# Patient Record
Sex: Female | Born: 1950 | Race: White | Hispanic: No | Marital: Married | Smoking: Former smoker
Health system: Southern US, Community
[De-identification: ages and names within clinical notes are randomized; demographics above are authoritative.]

## PROBLEM LIST (undated history)

## (undated) DIAGNOSIS — D649 Anemia, unspecified: Secondary | ICD-10-CM

## (undated) DIAGNOSIS — E785 Hyperlipidemia, unspecified: Secondary | ICD-10-CM

## (undated) DIAGNOSIS — C801 Malignant (primary) neoplasm, unspecified: Secondary | ICD-10-CM

## (undated) DIAGNOSIS — J069 Acute upper respiratory infection, unspecified: Secondary | ICD-10-CM

## (undated) DIAGNOSIS — H269 Unspecified cataract: Secondary | ICD-10-CM

## (undated) DIAGNOSIS — K219 Gastro-esophageal reflux disease without esophagitis: Secondary | ICD-10-CM

## (undated) DIAGNOSIS — Z9221 Personal history of antineoplastic chemotherapy: Secondary | ICD-10-CM

## (undated) DIAGNOSIS — I700 Atherosclerosis of aorta: Secondary | ICD-10-CM

## (undated) DIAGNOSIS — M1990 Unspecified osteoarthritis, unspecified site: Secondary | ICD-10-CM

## (undated) DIAGNOSIS — E079 Disorder of thyroid, unspecified: Secondary | ICD-10-CM

## (undated) DIAGNOSIS — I10 Essential (primary) hypertension: Secondary | ICD-10-CM

## (undated) DIAGNOSIS — M8580 Other specified disorders of bone density and structure, unspecified site: Secondary | ICD-10-CM

## (undated) DIAGNOSIS — L409 Psoriasis, unspecified: Secondary | ICD-10-CM

## (undated) DIAGNOSIS — T7840XA Allergy, unspecified, initial encounter: Secondary | ICD-10-CM

## (undated) DIAGNOSIS — R042 Hemoptysis: Secondary | ICD-10-CM

## (undated) DIAGNOSIS — K449 Diaphragmatic hernia without obstruction or gangrene: Secondary | ICD-10-CM

## (undated) HISTORY — DX: Hyperlipidemia, unspecified: E78.5

## (undated) HISTORY — PX: UPPER GASTROINTESTINAL ENDOSCOPY: SHX188

## (undated) HISTORY — DX: Unspecified cataract: H26.9

## (undated) HISTORY — DX: Allergy, unspecified, initial encounter: T78.40XA

## (undated) HISTORY — DX: Atherosclerosis of aorta: I70.0

## (undated) HISTORY — DX: Unspecified osteoarthritis, unspecified site: M19.90

## (undated) HISTORY — DX: Gastro-esophageal reflux disease without esophagitis: K21.9

## (undated) HISTORY — DX: Anemia, unspecified: D64.9

## (undated) HISTORY — PX: APPENDECTOMY: SHX54

## (undated) HISTORY — PX: OTHER SURGICAL HISTORY: SHX169

## (undated) HISTORY — PX: POLYPECTOMY: SHX149

## (undated) HISTORY — PX: LUNG REMOVAL, PARTIAL: SHX233

## (undated) HISTORY — DX: Other specified disorders of bone density and structure, unspecified site: M85.80

## (undated) HISTORY — DX: Diaphragmatic hernia without obstruction or gangrene: K44.9

## (undated) HISTORY — PX: FOOT NEUROMA SURGERY: SHX646

## (undated) HISTORY — DX: Psoriasis, unspecified: L40.9

## (undated) HISTORY — DX: Disorder of thyroid, unspecified: E07.9

---

## 1980-09-25 HISTORY — PX: TUBAL LIGATION: SHX77

## 2001-03-22 ENCOUNTER — Other Ambulatory Visit: Admission: RE | Admit: 2001-03-22 | Discharge: 2001-03-22 | Payer: Self-pay | Admitting: Internal Medicine

## 2001-03-22 ENCOUNTER — Encounter: Payer: Self-pay | Admitting: Internal Medicine

## 2001-03-22 LAB — CONVERTED CEMR LAB: Pap Smear: NORMAL

## 2001-04-25 HISTORY — PX: ESOPHAGOGASTRODUODENOSCOPY: SHX1529

## 2001-04-25 HISTORY — PX: COLONOSCOPY: SHX174

## 2003-07-13 ENCOUNTER — Encounter: Payer: Self-pay | Admitting: Internal Medicine

## 2004-08-09 ENCOUNTER — Emergency Department: Payer: Self-pay | Admitting: Emergency Medicine

## 2004-08-12 ENCOUNTER — Ambulatory Visit: Payer: Self-pay | Admitting: Internal Medicine

## 2004-08-30 ENCOUNTER — Ambulatory Visit: Payer: Self-pay | Admitting: Internal Medicine

## 2004-09-13 ENCOUNTER — Ambulatory Visit: Payer: Self-pay | Admitting: Internal Medicine

## 2005-03-01 ENCOUNTER — Ambulatory Visit: Payer: Self-pay | Admitting: Internal Medicine

## 2005-07-31 ENCOUNTER — Ambulatory Visit: Payer: Self-pay | Admitting: Family Medicine

## 2005-10-05 ENCOUNTER — Ambulatory Visit: Payer: Self-pay | Admitting: Internal Medicine

## 2005-10-05 ENCOUNTER — Other Ambulatory Visit: Admission: RE | Admit: 2005-10-05 | Discharge: 2005-10-05 | Payer: Self-pay | Admitting: Internal Medicine

## 2005-10-05 LAB — CONVERTED CEMR LAB: Pap Smear: NORMAL

## 2005-10-09 ENCOUNTER — Ambulatory Visit: Payer: Self-pay | Admitting: Internal Medicine

## 2007-01-22 ENCOUNTER — Encounter: Payer: Self-pay | Admitting: Internal Medicine

## 2007-01-22 DIAGNOSIS — L409 Psoriasis, unspecified: Secondary | ICD-10-CM | POA: Insufficient documentation

## 2007-02-04 ENCOUNTER — Ambulatory Visit: Payer: Self-pay | Admitting: Internal Medicine

## 2007-02-04 DIAGNOSIS — E785 Hyperlipidemia, unspecified: Secondary | ICD-10-CM | POA: Insufficient documentation

## 2007-02-05 ENCOUNTER — Encounter: Payer: Self-pay | Admitting: Internal Medicine

## 2007-02-11 ENCOUNTER — Encounter: Payer: Self-pay | Admitting: Internal Medicine

## 2007-03-28 ENCOUNTER — Ambulatory Visit: Payer: Self-pay | Admitting: Internal Medicine

## 2007-03-28 ENCOUNTER — Encounter: Payer: Self-pay | Admitting: Internal Medicine

## 2007-04-02 ENCOUNTER — Encounter (INDEPENDENT_AMBULATORY_CARE_PROVIDER_SITE_OTHER): Payer: Self-pay | Admitting: *Deleted

## 2007-05-10 ENCOUNTER — Telehealth (INDEPENDENT_AMBULATORY_CARE_PROVIDER_SITE_OTHER): Payer: Self-pay | Admitting: *Deleted

## 2007-08-07 ENCOUNTER — Encounter: Payer: Self-pay | Admitting: Internal Medicine

## 2007-12-04 ENCOUNTER — Encounter: Payer: Self-pay | Admitting: Internal Medicine

## 2007-12-13 ENCOUNTER — Ambulatory Visit: Payer: Self-pay | Admitting: Internal Medicine

## 2008-01-20 ENCOUNTER — Telehealth (INDEPENDENT_AMBULATORY_CARE_PROVIDER_SITE_OTHER): Payer: Self-pay | Admitting: *Deleted

## 2008-04-23 ENCOUNTER — Ambulatory Visit: Payer: Self-pay | Admitting: Internal Medicine

## 2008-04-24 ENCOUNTER — Encounter: Payer: Self-pay | Admitting: Internal Medicine

## 2008-04-28 ENCOUNTER — Encounter: Payer: Self-pay | Admitting: Internal Medicine

## 2008-04-28 ENCOUNTER — Ambulatory Visit: Payer: Self-pay | Admitting: Internal Medicine

## 2008-10-23 ENCOUNTER — Ambulatory Visit: Payer: Self-pay | Admitting: Internal Medicine

## 2008-11-19 ENCOUNTER — Telehealth: Payer: Self-pay | Admitting: Internal Medicine

## 2009-01-18 ENCOUNTER — Ambulatory Visit: Payer: Self-pay | Admitting: Internal Medicine

## 2009-04-26 ENCOUNTER — Telehealth: Payer: Self-pay | Admitting: Internal Medicine

## 2009-04-29 ENCOUNTER — Encounter (INDEPENDENT_AMBULATORY_CARE_PROVIDER_SITE_OTHER): Payer: Self-pay | Admitting: *Deleted

## 2009-04-29 ENCOUNTER — Ambulatory Visit: Payer: Self-pay | Admitting: Internal Medicine

## 2009-05-12 ENCOUNTER — Encounter: Payer: Self-pay | Admitting: Internal Medicine

## 2009-05-12 ENCOUNTER — Ambulatory Visit: Payer: Self-pay | Admitting: Internal Medicine

## 2009-05-17 ENCOUNTER — Encounter: Payer: Self-pay | Admitting: Internal Medicine

## 2010-10-27 ENCOUNTER — Encounter (INDEPENDENT_AMBULATORY_CARE_PROVIDER_SITE_OTHER): Payer: 59 | Admitting: Internal Medicine

## 2010-10-27 ENCOUNTER — Encounter: Payer: Self-pay | Admitting: Internal Medicine

## 2010-10-27 ENCOUNTER — Ambulatory Visit: Admit: 2010-10-27 | Payer: Self-pay | Admitting: Internal Medicine

## 2010-10-27 DIAGNOSIS — L408 Other psoriasis: Secondary | ICD-10-CM

## 2010-10-27 DIAGNOSIS — K219 Gastro-esophageal reflux disease without esophagitis: Secondary | ICD-10-CM | POA: Insufficient documentation

## 2010-10-27 DIAGNOSIS — Z0000 Encounter for general adult medical examination without abnormal findings: Secondary | ICD-10-CM

## 2010-10-27 DIAGNOSIS — M949 Disorder of cartilage, unspecified: Secondary | ICD-10-CM

## 2010-10-27 DIAGNOSIS — M899 Disorder of bone, unspecified: Secondary | ICD-10-CM | POA: Insufficient documentation

## 2010-11-01 ENCOUNTER — Other Ambulatory Visit: Payer: Self-pay | Admitting: Internal Medicine

## 2010-11-01 ENCOUNTER — Encounter (INDEPENDENT_AMBULATORY_CARE_PROVIDER_SITE_OTHER): Payer: Self-pay | Admitting: *Deleted

## 2010-11-01 ENCOUNTER — Other Ambulatory Visit: Payer: 59

## 2010-11-01 DIAGNOSIS — Z1211 Encounter for screening for malignant neoplasm of colon: Secondary | ICD-10-CM

## 2010-11-02 DIAGNOSIS — K921 Melena: Secondary | ICD-10-CM | POA: Insufficient documentation

## 2010-11-02 NOTE — Assessment & Plan Note (Signed)
Summary: CPE/CYD   Vital Signs:  Patient profile:   60 year old female Weight:      153 pounds BMI:     25.16 Temp:     98.9 degrees F oral Pulse rate:   83 / minute Pulse rhythm:   regular BP sitting:   122 / 68  (right arm) Cuff size:   regular  Vitals Entered By: Mervin Hack CMA Duncan Dull) (October 27, 2010 2:59 PM) CC: adult physical   History of Present Illness: Doing well Still on MTX---reasonable control of psoriasis Has had some recent flares due to stress at work  Still sees Dr Barnabas Lister Due for mammo --I order them Had cervical biopsy in past year---was negative. Pap again since then  Discussed colonoscopy she doesn't want this again  Allergies: 1)  Pravachol (Pravastatin Sodium)  Past History:  Past medical, surgical, family and social histories (including risk factors) reviewed for relevance to current acute and chronic problems.  Past Medical History: Psoriasis Hyperlipidemia Osteopenia GERD  Past Surgical History: Reviewed history from 12/13/2007 and no changes required. EGD/colon - dilation 08/02 1982---tubal ligation 1992/1994---Benign tumors of L foot VD x 2 Abnormal Pap--low grade intraepithelial lesion. Colposcipy okay  Family History: Reviewed history from 01/22/2007 and no changes required. Mom with CAD--Mi in her 36's Dad died of lung cancer No DM or HTN  Social History: Reviewed history from 10/23/2008 and no changes required. Divorced ---remarried---  1 child living.  1 son died in 93 Occupation: Office work @ [employer] Former smoker--quit 10/09 Alcohol use-yes--occ  Review of Systems General:  weight is up a few pounds has just restarted walking--has puppy sleep is variable---occ middle of the night awakening. Occ daytime tiredness wears seat belt. Eyes:  Complains of blurring; denies double vision and vision loss-1 eye. ENT:  Denies decreased hearing and ringing in ears; full dentures--fit okay. CV:  Complains of  chest pain or discomfort and shortness of breath with exertion; denies difficulty breathing at night, difficulty breathing while lying down, fainting, lightheadness, and palpitations; occ tight feeling in chest--relates to stress Has noted some increased DOE---relates to increased weight and decreased fitness. Resp:  Denies cough and shortness of breath. GI:  Complains of indigestion; denies abdominal pain, bloody stools, change in bowel habits, dark tarry stools, nausea, and vomiting; some trouble with hiatal hernia--as needed prevacid helps. GU:  Complains of incontinence; denies dysuria; rare urge incontinence---generally only first thing in the morning No sexual problems. MS:  Denies joint pain and joint swelling. Derm:  Complains of lesion(s); denies rash; slight brown areas on right temple. Neuro:  Denies headaches, numbness, tingling, and weakness. Psych:  Denies anxiety and depression. Heme:  Denies abnormal bruising and enlarge lymph nodes. Allergy:  Complains of seasonal allergies and sneezing; intermittent sinus symptoms---1/2 sudafed helps.  Physical Exam  General:  alert and normal appearance.   Eyes:  pupils equal, pupils round, pupils reactive to light, and no optic disk abnormalities.   Ears:  R ear normal and L ear normal.   Mouth:  no erythema, no exudates, and no lesions.   Neck:  supple, no masses, no thyromegaly, no carotid bruits, and no cervical lymphadenopathy.   Breasts:  no abnormal thickening, no tenderness, and no adenopathy.  Cystic changes on right Lungs:  normal respiratory effort, no intercostal retractions, no accessory muscle use, and normal breath sounds.   Heart:  normal rate, regular rhythm, no murmur, and no gallop.   Abdomen:  soft, non-tender, and no  masses.   Msk:  no joint tenderness and no joint swelling.   Pulses:  1+ in feet Extremities:  no edema Neurologic:  alert & oriented X3, strength normal in all extremities, and gait normal.   Skin:  no  ulcerations.   Very mild psoriatic rash mostly on elbows Right temple has benign nevi Psych:  normally interactive, good eye contact, not anxious appearing, and not depressed appearing.     Impression & Recommendations:  Problem # 1:  HEALTH MAINTENANCE EXAM (ICD-V70.0) Assessment Comment Only healthy discussed fitness stool immunoassay----she prefers no colon mammo  Problem # 2:  PSORIASIS (ICD-696.1) Assessment: Unchanged  doing fine needs labs for MTX  Orders: Specimen Handling (13086) Venipuncture (57846)  Problem # 3:  GERD (ICD-530.81) Assessment: Unchanged okay with as needed prevacid  Problem # 4:  OSTEOPENIA (ICD-733.90) Assessment: Unchanged on vitamin D  Her updated medication list for this problem includes:    Vitamin D (ergocalciferol) 50000 Unit Caps (Ergocalciferol) .Marland Kitchen... Take one by mouth monthly  Problem # 5:  HYPERLIPIDEMIA (ICD-272.4) Assessment: Unchanged will recheck levels  Complete Medication List: 1)  Methotrexate 2.5 Mg Tabs (Methotrexate sodium) .... Take 6 by mouth weekly 2)  Vitamin D (ergocalciferol) 50000 Unit Caps (Ergocalciferol) .... Take one by mouth monthly 3)  Folic Acid 1 Mg Tabs (Folic acid) .... Take one by mouth daily  Other Orders: Radiology Referral (Radiology)  Patient Instructions: 1)  Please schedule a follow-up appointment in 1 year.  2)  Schedule your mammogram.  3)  Complete your hemoccult cards and return them soon.  4)  Labs to [facility] today---  CBC with diff, met C, TSH-- 696.1 5)     lipid--272,4   Orders Added: 1)  Specimen Handling [99000] 2)  Venipuncture [96295] 3)  Est. Patient 40-64 years [99396] 4)  Radiology Referral [Radiology]    Current Allergies (reviewed today): PRAVACHOL (PRAVASTATIN SODIUM)  Appended Document: CPE/CYD

## 2010-11-02 NOTE — Letter (Signed)
Summary: [facility]: Immunoassay Fecal Occult Blood (iFOB) Order Form  [place] at [facility]  [address] [place], [place] [address]   Phone: [phone]  Fax: [phone]      [facility]: Immunoassay Fecal Occult Blood (iFOB) Order Form   October 27, 2010 MRN: [id number]   Regina Grimes 1951/07/10   Physicican Name:__________[place]_______________  Diagnosis Code:___________V76.51_______________      Cindee Salt MD

## 2010-11-03 ENCOUNTER — Encounter (INDEPENDENT_AMBULATORY_CARE_PROVIDER_SITE_OTHER): Payer: Self-pay | Admitting: *Deleted

## 2010-11-10 NOTE — Letter (Signed)
Summary: Pre Visit Letter Revised  [facility]  [address] [place], [place] [address]   Phone: [phone]  Fax: [phone]        11/03/2010 MRN: [id number] Regina Grimes [address] [place], [place]  [address]  [place]             Procedure Date:  12-16-10   Welcome to the Gastroenterology Division at [facility].    You are scheduled to see a nurse for your pre-procedure visit on 12-02-10 at 8:30A.M. on the 3rd floor at [facility], [address]  We ask that you try to arrive at our office 15 minutes prior to your appointment time to allow for check-in.  Please take a minute to review the attached form.  If you answer "Yes" to one or more of the questions on the first page, we ask that you call the person listed at your earliest opportunity.  If you answer "No" to all of the questions, please complete the rest of the form and bring it to your appointment.    Your nurse visit will consist of discussing your medical and surgical history, your immediate family medical history, and your medications.   If you are unable to list all of your medications on the form, please bring the medication bottles to your appointment and we will list them.  We will need to be aware of both prescribed and over the counter drugs.  We will need to know exact dosage information as well.    Please be prepared to read and sign documents such as consent forms, a financial agreement, and acknowledgement forms.  If necessary, and with your consent, a friend or relative is welcome to sit-in on the nurse visit with you.  Please bring your insurance card so that we may make a copy of it.  If your insurance requires a referral to see a specialist, please bring your referral form from your primary care physician.  No co-pay is required for this nurse visit.     If you cannot keep your appointment, please call [phone] to cancel or reschedule prior to your appointment date.  This allows [place]  the opportunity to schedule an appointment for another patient in need of care.    Thank you for choosing [facility] for your medical needs.  We appreciate the opportunity to care for you.  Please visit [place] at our website  to learn more about our practice.  Sincerely, The Gastroenterology Division

## 2010-11-16 ENCOUNTER — Encounter: Payer: Self-pay | Admitting: Internal Medicine

## 2010-11-16 ENCOUNTER — Ambulatory Visit: Payer: Self-pay | Admitting: Internal Medicine

## 2010-11-21 ENCOUNTER — Encounter: Payer: Self-pay | Admitting: Internal Medicine

## 2010-12-01 ENCOUNTER — Telehealth: Payer: Self-pay | Admitting: Family Medicine

## 2010-12-01 NOTE — Letter (Signed)
Summary: Results Follow up Letter  [place] at [facility]  [address] [place], [place] [address]   Phone: [phone]  Fax: [phone]    11/21/2010 MRN: [id number]  [facility] [address] [place], [place]  [address]  [place]  Dear Ms. SAKSA,  The following are the results of your recent test(s):  Test         Result    Pap Smear:        Normal _____  Not Normal _____ Comments: ______________________________________________________ Cholesterol: LDL(Bad cholesterol):         Your goal is less than:         HDL (Good cholesterol):       Your goal is more than: Comments:  ______________________________________________________ Mammogram:        Normal __X___  Not Normal _____ Comments:mammo looks fine repeat recommended in 1-2 years  ___________________________________________________________________ Hemoccult:        Normal _____  Not normal _______ Comments:    _____________________________________________________________________ Other Tests:    We routinely do not discuss normal results over the telephone.  If you desire a copy of the results, or you have any questions about this information we can discuss them at your next office visit.   Sincerely,      Nada Libman

## 2010-12-06 ENCOUNTER — Encounter: Payer: Self-pay | Admitting: Gastroenterology

## 2010-12-06 NOTE — Progress Notes (Signed)
Summary: FLUOCINONIDE 0.05%  OIN  Phone Note Refill Request Message from:  [facility] [phone] on December 01, 2010 6:03 PM  E-Scribe Request for FLUOCINONIDE 0.05%  OIN, last refilled 03/18/2010, not on active med list. ok to refill?   Method Requested: Electronic Initial call taken by: Mervin Hack CMA Duncan Dull),  December 01, 2010 6:03 PM  Follow-up for Phone Call        yes ok to refill. Ruthe Mannan MD  December 02, 2010 10:08 AM     New/Updated Medications: FLUOCINONIDE 0.05 % OINT (FLUOCINONIDE) Apply to affected area as needed. Prescriptions: FLUOCINONIDE 0.05 % OINT (FLUOCINONIDE) Apply to affected area as needed.  #15gram x 0   Entered by:   Lewanda Rife LPN   Authorized by:   Ruthe Mannan MD   Signed by:   Lewanda Rife LPN on 45/40/9811   Method used:   Electronically to        [facility] [address] (retail)       [address], [address]       [place], [place]  [address]       Ph: [phone]       Fax: [phone]   RxID:   [id number]   Current Allergies: PRAVACHOL (PRAVASTATIN SODIUM)

## 2010-12-13 NOTE — Miscellaneous (Signed)
Summary: Lec previsit.  Clinical Lists Changes  Medications: Added new medication of MOVIPREP 100 GM  SOLR (PEG-KCL-NACL-NASULF-NA ASC-C) As per prep instructions. - Signed Rx of MOVIPREP 100 GM  SOLR (PEG-KCL-NACL-NASULF-NA ASC-C) As per prep instructions.;  #1 x 0;  Signed;  Entered by: Ulis Rias RN;  Authorized by: Meryl Dare MD [facility];  Method used: Electronically to [facility] [address], [place], [place], [place]  [address], Ph: [phone], Fax: [phone] Allergies: Added new allergy or adverse reaction of ASPIRIN    Prescriptions: MOVIPREP 100 GM  SOLR (PEG-KCL-NACL-NASULF-NA ASC-C) As per prep instructions.  #1 x 0   Entered by:   Ulis Rias RN   Authorized by:   Meryl Dare MD [facility]   Signed by:   Ulis Rias RN on 12/06/2010   Method used:   Electronically to        [facility] [address] (retail)       [address], [address]       [place], [place]  [address]       Ph: [phone]       Fax: [phone]   RxID:   [id number]

## 2010-12-13 NOTE — Letter (Signed)
Summary: [facility] Instructions  [facility]  [address] [place], [place] [address]   Phone: [phone]  Fax: [phone]       Regina Grimes    19-Dec-1950    MRN: [id number]        Procedure Day Dorna Bloom:  Farrell Ours  12/16/10     Arrival Time:  9:00AM     Procedure Time:  10:00AM     Location of Procedure:                    _X_  [facility] (4th Floor)                      PREPARATION FOR COLONOSCOPY WITH MOVIPREP   Starting 5 days prior to your procedure 12/11/10 do not eat nuts, seeds, popcorn, corn, beans, peas,  salads, or any raw vegetables.  Do not take any fiber supplements (e.g. Metamucil, Citrucel, and Benefiber).  THE DAY BEFORE YOUR PROCEDURE         DATE: 12/15/10   DAY: THURSDAY  1.  Drink clear liquids the entire day-NO SOLID FOOD  2.  Do not drink anything colored red or purple.  Avoid juices with pulp.  No orange juice.  3.  Drink at least 64 oz. (8 glasses) of fluid/clear liquids during the day to prevent dehydration and help the prep work efficiently.  CLEAR LIQUIDS INCLUDE: Water Jello Ice Popsicles Tea (sugar ok, no milk/cream) Powdered fruit flavored drinks Coffee (sugar ok, no milk/cream) Gatorade Juice: apple, white grape, white cranberry  Lemonade Clear bullion, consomm, broth Carbonated beverages (any kind) Strained chicken noodle soup Hard Candy                             4.  In the morning, mix first dose of MoviPrep solution:    Empty 1 Pouch A and 1 Pouch B into the disposable container    Add lukewarm drinking water to the top line of the container. Mix to dissolve    Refrigerate (mixed solution should be used within 24 hrs)  5.  Begin drinking the prep at 5:00 p.m. The MoviPrep container is divided by 4 marks.   Every 15 minutes drink the solution down to the next mark (approximately 8 oz) until the full liter is complete.   6.  Follow completed prep with 16 oz of clear liquid of your choice (Nothing  red or purple).  Continue to drink clear liquids until bedtime.  7.  Before going to bed, mix second dose of MoviPrep solution:    Empty 1 Pouch A and 1 Pouch B into the disposable container    Add lukewarm drinking water to the top line of the container. Mix to dissolve    Refrigerate  THE DAY OF YOUR PROCEDURE      DATE: 12/16/10   DAY: FRIDAY  Beginning at 5:00AM (5 hours before procedure):         1. Every 15 minutes, drink the solution down to the next mark (approx 8 oz) until the full liter is complete.  2. Follow completed prep with 16 oz. of clear liquid of your choice.    3. You may drink clear liquids until 8:00AM (2 HOURS BEFORE PROCEDURE).   MEDICATION INSTRUCTIONS  Unless otherwise instructed, you should take regular prescription medications with a small sip of water   as early as possible the morning of  your procedure.         OTHER INSTRUCTIONS  You will need a responsible adult at least 58 years of age to accompany you and drive you home.   This person must remain in the waiting room during your procedure.  Wear loose fitting clothing that is easily removed.  Leave jewelry and other valuables at home.  However, you may wish to bring a book to read or  an iPod/MP3 player to listen to music as you wait for your procedure to start.  Remove all body piercing jewelry and leave at home.  Total time from sign-in until discharge is approximately 2-3 hours.  You should go home directly after your procedure and rest.  You can resume normal activities the  day after your procedure.  The day of your procedure you should not:   Drive   Make legal decisions   Operate machinery   Drink alcohol   Return to work  You will receive specific instructions about eating, activities and medications before you leave.    The above instructions have been reviewed and explained to me by   Ulis Rias RN  December 06, 2010 8:45 AM    I fully understand and can  verbalize these instructions _____________________________ Date _________

## 2010-12-15 ENCOUNTER — Encounter: Payer: Self-pay | Admitting: *Deleted

## 2010-12-16 ENCOUNTER — Encounter: Payer: Self-pay | Admitting: Gastroenterology

## 2010-12-16 ENCOUNTER — Ambulatory Visit (AMBULATORY_SURGERY_CENTER): Payer: 59 | Admitting: Gastroenterology

## 2010-12-16 VITALS — BP 128/94 | HR 74 | Temp 97.8°F | Resp 17 | Ht 65.0 in | Wt 151.0 lb

## 2010-12-16 DIAGNOSIS — R195 Other fecal abnormalities: Secondary | ICD-10-CM

## 2010-12-16 DIAGNOSIS — K5730 Diverticulosis of large intestine without perforation or abscess without bleeding: Secondary | ICD-10-CM

## 2010-12-16 DIAGNOSIS — D126 Benign neoplasm of colon, unspecified: Secondary | ICD-10-CM

## 2010-12-16 DIAGNOSIS — K921 Melena: Secondary | ICD-10-CM

## 2010-12-16 DIAGNOSIS — K620 Anal polyp: Secondary | ICD-10-CM

## 2010-12-16 NOTE — Patient Instructions (Addendum)
Discharge instructions reviewed  Polyp, diverticulosis, high fiber diet education given  High fiber diet with liberal water intake  Pt to await pathology results by letter in approximately 2 weeks  Consider MAC anesthesia for future procedures

## 2010-12-19 ENCOUNTER — Telehealth: Payer: Self-pay | Admitting: *Deleted

## 2010-12-19 NOTE — Telephone Encounter (Signed)
Unable to reach patient.

## 2010-12-20 ENCOUNTER — Encounter: Payer: Self-pay | Admitting: Gastroenterology

## 2010-12-22 NOTE — Procedures (Signed)
Summary: Colonoscopy  Patient: Regina Grimes Note: All result statuses are Final unless otherwise noted.  Tests: (1) Colonoscopy (COL)   COL Colonoscopy           DONE (C)     [facility]     [address]     [place], [place]  [address]          COLONOSCOPY PROCEDURE REPORT          PATIENT:  Regina Grimes, Regina Grimes  MR#:  [id number]     BIRTHDATE:  07/23/1951, 59 yrs. old  GENDER:  female     ENDOSCOPIST:  Judie Petit T. Russella Dar, MD, [facility]     Referred by:  Tillman Abide, M.D.     PROCEDURE DATE:  12/16/2010     PROCEDURE:  Colonoscopy with snare polypectomy     ASA CLASS:  Class II     INDICATIONS:  1) heme positive stool     MEDICATIONS:   Fentanyl 125 mcg IV, Versed 12 mg IV 25, Benadryl     25 mg IV     DESCRIPTION OF PROCEDURE:   After the risks benefits and     alternatives of the procedure were thoroughly explained, informed     consent was obtained.  Digital rectal exam was performed and     revealed small external hemorrhoids.   The LB [id number]-[id number]     endoscope was introduced through the anus and advanced to the     cecum, which was identified by both the appendix and ileocecal     valve, limited by a tortuous colon.    The quality of the prep was     excellent, using MoviPrep.  The instrument was then slowly     withdrawn as the colon was fully examined.     <<PROCEDUREIMAGES>>     FINDINGS:  A sessile polyp was found in the rectum. It was 5 mm in     size. Polyp was snared without cautery. Retrieval was successful.     Mild diverticulosis was noted in the sigmoid colon. A normal     appearing cecum, ileocecal valve, and appendiceal orifice were     identified. The ascending, hepatic flexure, transverse, splenic     flexure, descending colon appeared unremarkable.  Retroflexed     views in the rectum revealed no abnormalities. The time to cecum =     6.67  minutes. The scope was the withdrawn (time =  10.25  min)     from the patient and the procedure  completed.          COMPLICATIONS:  None          ENDOSCOPIC IMPRESSION:     1) 5 mm sessile polyp in the rectum     2) Diverticulosis, sigmoid colon          RECOMMENDATIONS:     1) Await pathology results     2) If the polyp is adenomatous (pre-cancerous), colonoscopy in 5     years. Otherwise follow colorectal cancer screening guidelines for     "routine risk" patients with colonoscopy in 10 years.     3) High fiber diet with liberal water intake     3) Consider MAC for future procedures          Zoriana Oats T. Russella Dar, MD, [facility]          n.     REVISED:  12/16/2010 10:28 AM     eSIGNED:  Venita Lick. Rital Cavey at 12/16/2010 10:28 AM          Brigitte Pulse, [phone]  Note: An exclamation mark (!) indicates a result that was not dispersed into the flowsheet. Document Creation Date: 12/16/2010 10:28 AM _______________________________________________________________________  (1) Order result status: Final Collection or observation date-time: 12/16/2010 10:14 Requested date-time:  Receipt date-time:  Reported date-time:  Referring Physician:   Ordering Physician: Claudette Head [phone]) Specimen Source:  Source: Launa Grill Order Number: [id number] Lab site:

## 2010-12-27 ENCOUNTER — Telehealth: Payer: Self-pay | Admitting: Gastroenterology

## 2010-12-27 NOTE — Telephone Encounter (Signed)
I reviewed with the patient the letter and colon results.  She is advised that she is due for a colon in 10 years.

## 2011-02-08 ENCOUNTER — Ambulatory Visit (INDEPENDENT_AMBULATORY_CARE_PROVIDER_SITE_OTHER): Payer: 59 | Admitting: Internal Medicine

## 2011-02-08 ENCOUNTER — Encounter: Payer: Self-pay | Admitting: Internal Medicine

## 2011-02-08 VITALS — BP 120/70 | HR 70 | Temp 98.8°F | Wt 149.0 lb

## 2011-02-08 DIAGNOSIS — K219 Gastro-esophageal reflux disease without esophagitis: Secondary | ICD-10-CM

## 2011-02-08 DIAGNOSIS — R1013 Epigastric pain: Secondary | ICD-10-CM

## 2011-02-08 NOTE — Patient Instructions (Signed)
Please restart daily omeprazole 20mg  Call if you have recurrent pain episodes

## 2011-02-08 NOTE — Progress Notes (Signed)
  Subjective:    Patient ID: Regina Grimes, female    DOB: 1950-12-21, 60 y.o.   MRN: [id number]  HPI "I think my hernia is acting up"  Last night had bad indigestion and hurting along both flanks Had sensation "like someone was holding me tight" Started ~6PM, and briefly worsened No dinner last night Refused to go to ER like husband wanted Took a prilosec last night  Better today but still with some epigastric pain No nausea Worsened with deep breath  Has just had yogurt and soft serve ice cream today  Does take tums for indigestion just about daily No swallowing problems  Current outpatient prescriptions:ergocalciferol (VITAMIN D2) 50000 UNITS capsule, Take 50,000 Units by mouth every 30 (thirty) days.  , Disp: , Rfl: ;  fluocinonide (LIDEX) 0.05 % ointment, Apply topically. Apply to affected area as needed. , Disp: , Rfl: ;  folic acid (FOLVITE) 1 MG tablet, Take 1 mg by mouth daily.  , Disp: , Rfl:  methotrexate (RHEUMATREX) 2.5 MG tablet, Take 2.5 mg by mouth. Caution:Chemotherapy. Protect from light. Take 6 by mouth weekly , Disp: , Rfl:   Past Medical History  Diagnosis Date  . Psoriasis   . Hyperlipidemia   . Osteopenia   . GERD (gastroesophageal reflux disease)   . Arthritis     arm  . Hiatal hernia     Past Surgical History  Procedure Date  . Colonoscopy 8/02  . Esophagogastroduodenoscopy 8/02  . Tubal ligation 1982  . Benign tumors of left foot 1992/1994  . Vd     x 2  . Abnormal pap     low grade intrepithelial lesion. Colposcipy okay  . Appendectomy     Family History  Problem Relation Age of Onset  . Coronary artery disease Mother   . Lung cancer Father   . Breast cancer Paternal Aunt   . Cirrhosis Maternal Grandmother     History   Social History  . Marital Status: Married    Spouse Name: N/A    Number of Children: N/A  . Years of Education: N/A   Occupational History  . Not on file.   Social History Main Topics  . Smoking status:  Former Smoker    Quit date: 06/25/2008  . Smokeless tobacco: Not on file  . Alcohol Use: No  . Drug Use: No  . Sexually Active: Not on file   Other Topics Concern  . Not on file   Social History Narrative  . No narrative on file    Review of Systems No cough or fever Weight is stable Bowels have been fine    Objective:   Physical Exam  Constitutional: She appears well-developed and well-nourished. No distress.  Neck: Normal range of motion. Neck supple. No thyromegaly present.  Cardiovascular: Normal rate, regular rhythm, normal heart sounds and intact distal pulses.  Exam reveals no gallop.   No murmur heard. Pulmonary/Chest: Effort normal and breath sounds normal. No respiratory distress. She has no wheezes. She has no rales.  Abdominal: Soft. She exhibits no mass. There is tenderness.       Mild epigastric tenderness (mimicks pain she has felt) and slight in LLQ as well  Musculoskeletal: Normal range of motion. She exhibits no edema and no tenderness.  Lymphadenopathy:    She has no cervical adenopathy.          Assessment & Plan:

## 2011-05-18 ENCOUNTER — Other Ambulatory Visit: Payer: Self-pay | Admitting: Family Medicine

## 2011-05-19 ENCOUNTER — Other Ambulatory Visit: Payer: Self-pay | Admitting: Family Medicine

## 2011-05-22 NOTE — Telephone Encounter (Signed)
Dr. Karle Starch pt. I will route to him.

## 2011-06-26 ENCOUNTER — Ambulatory Visit: Payer: 59 | Admitting: Internal Medicine

## 2011-08-10 ENCOUNTER — Other Ambulatory Visit: Payer: Self-pay | Admitting: *Deleted

## 2011-08-10 MED ORDER — METHOTREXATE 2.5 MG PO TABS: 15.0000 mg | ORAL_TABLET | ORAL | Status: DC

## 2011-08-10 NOTE — Telephone Encounter (Signed)
Order written for 1 year

## 2011-08-10 NOTE — Telephone Encounter (Signed)
Form on your desk  

## 2011-08-10 NOTE — Telephone Encounter (Signed)
Order faxed to [facility].

## 2011-09-26 HISTORY — PX: BREAST BIOPSY: SHX20

## 2011-09-26 HISTORY — PX: LUNG REMOVAL, PARTIAL: SHX233

## 2011-10-27 DIAGNOSIS — C801 Malignant (primary) neoplasm, unspecified: Secondary | ICD-10-CM

## 2011-10-27 HISTORY — DX: Malignant (primary) neoplasm, unspecified: C80.1

## 2011-11-05 ENCOUNTER — Inpatient Hospital Stay: Payer: Self-pay | Admitting: Internal Medicine

## 2011-11-05 LAB — COMPREHENSIVE METABOLIC PANEL
Albumin: 3.5 g/dL (ref 3.4–5.0)
Alkaline Phosphatase: 80 U/L (ref 50–136)
Anion Gap: 12 (ref 7–16)
BUN: 8 mg/dL (ref 7–18)
Bilirubin,Total: 0.3 mg/dL (ref 0.2–1.0)
Chloride: 105 mmol/L (ref 98–107)
Creatinine: 0.7 mg/dL (ref 0.60–1.30)
EGFR (African American): >60
Osmolality: 288 (ref 275–301)
SGPT (ALT): 15 U/L
Sodium: 146 mmol/L — ABNORMAL HIGH (ref 136–145)
Total Protein: 7.6 g/dL (ref 6.4–8.2)

## 2011-11-05 LAB — CBC
HCT: 36.9 % (ref 35.0–47.0)
HGB: 12.6 g/dL (ref 12.0–16.0)
MCH: 30.9 pg (ref 26.0–34.0)
MCHC: 34.2 g/dL (ref 32.0–36.0)
MCV: 91 fL (ref 80–100)
RBC: 4.07 10*6/uL (ref 3.80–5.20)
RDW: 13.8 % (ref 11.5–14.5)
WBC: 9.6 10*3/uL (ref 3.6–11.0)

## 2011-11-05 LAB — PROTIME-INR
INR: 0.9
Prothrombin Time: 12.3 secs (ref 11.5–14.7)

## 2011-11-06 LAB — CBC WITH DIFFERENTIAL/PLATELET
Basophil #: 0 10*3/uL (ref 0.0–0.1)
Eosinophil #: 0.3 10*3/uL (ref 0.0–0.7)
HCT: 33.5 % — ABNORMAL LOW (ref 35.0–47.0)
Lymphocyte #: 1.8 10*3/uL (ref 1.0–3.6)
Lymphocyte %: 20.9 %
MCHC: 34.1 g/dL (ref 32.0–36.0)
MCV: 90 fL (ref 80–100)
Monocyte %: 7.9 %
Neutrophil #: 5.8 10*3/uL (ref 1.4–6.5)
Platelet: 274 10*3/uL (ref 150–440)
RDW: 14.2 % (ref 11.5–14.5)

## 2011-11-06 LAB — BASIC METABOLIC PANEL
BUN: 8 mg/dL (ref 7–18)
Calcium, Total: 8.8 mg/dL (ref 8.5–10.1)
Creatinine: 0.72 mg/dL (ref 0.60–1.30)
EGFR (African American): >60
EGFR (Non-African Amer.): >60
Glucose: 97 mg/dL (ref 65–99)
Osmolality: 283 (ref 275–301)
Potassium: 4.1 mmol/L (ref 3.5–5.1)

## 2011-11-08 ENCOUNTER — Telehealth: Payer: Self-pay | Admitting: Internal Medicine

## 2011-11-08 NOTE — Telephone Encounter (Signed)
Patient was recently released from [facility] where they set up an appointment for her to get a biopsy of her left lung.  She would like to know if she should keep that appointment with [facility], or would going to [facility] for the biopsy be better.

## 2011-11-08 NOTE — Telephone Encounter (Signed)
Let her know they have a specialist for this at [facility] now who I have heard is pretty good. If [facility] is easier, I think this is fine

## 2011-11-08 NOTE — Telephone Encounter (Signed)
Spoke with patient and advised results, she will get [facility] to send results also.

## 2011-11-09 ENCOUNTER — Emergency Department: Payer: Self-pay | Admitting: Emergency Medicine

## 2011-11-09 ENCOUNTER — Ambulatory Visit: Payer: Self-pay | Admitting: Specialist

## 2011-11-10 LAB — EXPECTORATED SPUTUM ASSESSMENT W GRAM STAIN, RFLX TO RESP C

## 2011-11-11 LAB — CULTURE, BLOOD (SINGLE)

## 2011-11-13 ENCOUNTER — Telehealth: Payer: Self-pay | Admitting: Internal Medicine

## 2011-11-13 DIAGNOSIS — J852 Abscess of lung without pneumonia: Secondary | ICD-10-CM | POA: Insufficient documentation

## 2011-11-13 NOTE — Telephone Encounter (Signed)
Patient would like to change from Dr.Fleming to a new pulmonary doctor.  She would like to be referred to [facility] at the [facility].

## 2011-11-14 ENCOUNTER — Telehealth: Payer: Self-pay | Admitting: Pulmonary Disease

## 2011-11-14 NOTE — Telephone Encounter (Signed)
Appt made with Dr Vassie Loll at [place] on 11/15/2011. MK

## 2011-11-14 NOTE — Telephone Encounter (Signed)
D/c received.

## 2011-11-15 ENCOUNTER — Ambulatory Visit (INDEPENDENT_AMBULATORY_CARE_PROVIDER_SITE_OTHER)
Admission: RE | Admit: 2011-11-15 | Discharge: 2011-11-15 | Disposition: A | Discharge status: Home / Self Care | Payer: 59 | Source: Ambulatory Visit | Attending: Pulmonary Disease | Admitting: Pulmonary Disease

## 2011-11-15 ENCOUNTER — Ambulatory Visit (INDEPENDENT_AMBULATORY_CARE_PROVIDER_SITE_OTHER): Payer: 59 | Admitting: Pulmonary Disease

## 2011-11-15 ENCOUNTER — Encounter: Payer: Self-pay | Admitting: Pulmonary Disease

## 2011-11-15 ENCOUNTER — Encounter (HOSPITAL_COMMUNITY): Payer: Self-pay | Admitting: Radiology

## 2011-11-15 VITALS — BP 136/70 | HR 87 | Temp 100.5°F | Ht 65.0 in | Wt 134.0 lb

## 2011-11-15 DIAGNOSIS — J852 Abscess of lung without pneumonia: Secondary | ICD-10-CM

## 2011-11-15 MED ORDER — LEVOFLOXACIN 500 MG PO TABS: 500.0000 mg | ORAL_TABLET | Freq: Every day | ORAL | Status: DC

## 2011-11-15 NOTE — Patient Instructions (Addendum)
Bronchoscopy  Arranged for tomorrow am - we discussed risks & benefits Be at 1st floor - [place] admitting at 645 am Chest xray today Nothing to eat after midnight tonight Take levaquin daily x 7 days Take probiotic DO NOT take methotrexate or aspirin  Call if increased bleeding or fever 101 & above

## 2011-11-15 NOTE — Progress Notes (Deleted)
  Subjective:    Patient ID: Regina Grimes, female    DOB: 27-Oct-1950, 61 y.o.   MRN: [id number]  HPI    Review of Systems     Objective:   Physical Exam        Assessment & Plan:

## 2011-11-15 NOTE — Assessment & Plan Note (Signed)
DD incl malignancy - decrease in size of lesion on rpt CT & fevers  favor infection Bronchoscopy  Arranged for tomorrow am - we discussed risks & benefits The risks of the procedure including coughing, bleeding and the small chance of lung puncture requiring chest tube were discussed in great detail. The benefits & alternatives including serial follow up were also discussed. This will enable inspection of tracheobronchial tree & obtaining cx & cytology Chest xray today shows  6cm LLL lesion Nothing to eat after midnight tonight Take levaquin daily x 7 days (since febrile on augmentin) - will consider adding clindamycin based on cx data Take probiotic since expect longer duration of abx DO NOT take methotrexate or aspirin  Call if increased bleeding or fever 101 & above

## 2011-11-15 NOTE — Progress Notes (Signed)
  Subjective:    Patient ID: Regina Grimes, female    DOB: 03/05/1951, 60 y.o.   MRN: [id number]  HPI PCP - [place] 60-year-old ex-smoker, presents for evaluation of hemoptysis and abnormal imaging. She is on weekly methotrexate for psoriasis and has not taken this in 3 weeks. She presented to [facility] on 11/05/2011 with hemoptysis for 3-4 days, low-grade fever and dyspnea. Chest x-ray showed a cavitary mass in the left lower lobe measuring 5 cm. CT chest with contrast showed a thickwalled cavitary mass medially in the left lower lobe measuring 5.3x3.9x5.6 cm with an enlarged left hilar lymph node. There were a few borderline enlarged axillary and porta hepatis lymph nodes. No other parenchymal abnormality was noted. Her white cell count was 9.6, she reported a 20 pound weight loss but no night sweats. Quantiferon gold was negative per patient. Sputum AFB was negative as were blood cultures. She was treated with Zosyn, Levaquin and clindamycin. She was seen by Dr. Fleming, CT-guided biopsy was scheduled since patient requested to go home and she was discharged on Augmentin 875 twice a day. Repeat CT chest was performed and showed that the left lower lobe mass had decreased slightly in size and cavitation had increased, hence biopsy was deferred and it was decided to follow this lesion to resolution. Meanwhile she continued to have hemoptysis about 1/4 cup per day. Low-grade fevers have persisted on Augmentin, her PCP made an appointment with us. I have reviewed her films from [place] and discharge summary. She denies hematuria, burning micturition or foul-smelling sputum production. Is no history of TB contact. She quit smoking in February 2011   Review of Systems  Constitutional: Positive for unexpected weight change. Negative for fever.  HENT: Negative for ear pain, nosebleeds, congestion, sore throat, rhinorrhea, sneezing, trouble swallowing, dental problem, postnasal drip  and sinus pressure.   Eyes: Negative for redness and itching.  Respiratory: Positive for cough. Negative for chest tightness, shortness of breath and wheezing.        Coughing up blood 1/4 cup x 1 day  Cardiovascular: Negative for palpitations and leg swelling.  Gastrointestinal: Negative for nausea and vomiting.  Genitourinary: Negative for dysuria.  Musculoskeletal: Negative for joint swelling.  Skin: Negative for rash.  Neurological: Negative for headaches.  Hematological: Does not bruise/bleed easily.  Psychiatric/Behavioral: Negative for dysphoric mood. The patient is not nervous/anxious.        Objective:   Physical Exam  Gen. Pleasant, well-nourished, in no distress, normal affect ENT - no lesions, no post nasal drip Neck: No JVD, no thyromegaly, no carotid bruits Lungs: no use of accessory muscles, no dullness to percussion, clear without rales or rhonchi  Cardiovascular: Rhythm regular, heart sounds  normal, no murmurs or gallops, no peripheral edema Abdomen: soft and non-tender, no hepatosplenomegaly, BS normal. Musculoskeletal: No deformities, no cyanosis or clubbing Neuro:  alert, non focal Skin- psoriatic patches over elbows        Assessment & Plan:   

## 2011-11-16 ENCOUNTER — Ambulatory Visit (HOSPITAL_COMMUNITY): Payer: 59

## 2011-11-16 ENCOUNTER — Encounter (HOSPITAL_COMMUNITY)
Admission: RE | Disposition: A | Discharge status: Home / Self Care | Payer: Self-pay | Source: Ambulatory Visit | Attending: Pulmonary Disease

## 2011-11-16 ENCOUNTER — Ambulatory Visit (HOSPITAL_COMMUNITY)
Admission: RE | Admit: 2011-11-16 | Discharge: 2011-11-16 | Disposition: A | Discharge status: Home / Self Care | Payer: 59 | Source: Ambulatory Visit | Attending: Pulmonary Disease | Admitting: Pulmonary Disease

## 2011-11-16 ENCOUNTER — Encounter (HOSPITAL_COMMUNITY): Payer: Self-pay | Admitting: Radiology

## 2011-11-16 ENCOUNTER — Ambulatory Visit: Payer: 59 | Admitting: Internal Medicine

## 2011-11-16 DIAGNOSIS — R042 Hemoptysis: Secondary | ICD-10-CM

## 2011-11-16 DIAGNOSIS — L408 Other psoriasis: Secondary | ICD-10-CM | POA: Insufficient documentation

## 2011-11-16 DIAGNOSIS — C3430 Malignant neoplasm of lower lobe, unspecified bronchus or lung: Secondary | ICD-10-CM | POA: Insufficient documentation

## 2011-11-16 DIAGNOSIS — J852 Abscess of lung without pneumonia: Secondary | ICD-10-CM

## 2011-11-16 HISTORY — PX: VIDEO BRONCHOSCOPY: SHX5072

## 2011-11-16 SURGERY — BRONCHOSCOPY, WITH FLUOROSCOPY
Anesthesia: Moderate Sedation | Laterality: Bilateral

## 2011-11-16 MED ORDER — PHENYLEPHRINE HCL 0.5 % NA SOLN
NASAL | Status: DC | PRN
  Administered 2011-11-16: 2 [drp] via NASAL

## 2011-11-16 MED ORDER — MIDAZOLAM HCL 10 MG/2ML IJ SOLN
INTRAMUSCULAR | Status: DC | PRN
  Administered 2011-11-16 (×2): 1 mg via INTRAVENOUS

## 2011-11-16 MED ORDER — SODIUM CHLORIDE 0.9 % IV SOLN
INTRAVENOUS | Status: DC
  Administered 2011-11-16: 20 mL/h via INTRAVENOUS

## 2011-11-16 MED ORDER — FENTANYL CITRATE 0.05 MG/ML IJ SOLN
INTRAMUSCULAR | Status: DC | PRN
  Administered 2011-11-16 (×2): 25 ug via INTRAVENOUS

## 2011-11-16 MED ORDER — LIDOCAINE HCL 2 % EX GEL
CUTANEOUS | Status: DC | PRN
  Administered 2011-11-16: 1

## 2011-11-16 MED ORDER — MIDAZOLAM HCL 10 MG/2ML IJ SOLN
INTRAMUSCULAR | Status: AC
  Filled 2011-11-16: qty 4

## 2011-11-16 MED ORDER — LIDOCAINE HCL 1 % IJ SOLN
INTRAMUSCULAR | Status: DC | PRN
  Administered 2011-11-16: 6 mL via RESPIRATORY_TRACT

## 2011-11-16 MED ORDER — FENTANYL CITRATE 0.05 MG/ML IJ SOLN
INTRAMUSCULAR | Status: AC
  Filled 2011-11-16: qty 4

## 2011-11-16 NOTE — H&P (View-Only) (Signed)
  Subjective:    Patient ID: Regina Grimes, female    DOB: 12/10/1950, 61 y.o.   MRN: [id number]  HPI PCP - Alphonsus Sias 61 year old ex-smoker, presents for evaluation of hemoptysis and abnormal imaging. She is on weekly methotrexate for psoriasis and has not taken this in 3 weeks. She presented to [facility] on 11/05/2011 with hemoptysis for 3-4 days, low-grade fever and dyspnea. Chest x-ray showed a cavitary mass in the left lower lobe measuring 5 cm. CT chest with contrast showed a thickwalled cavitary mass medially in the left lower lobe measuring 5.3x3.9x5.6 cm with an enlarged left hilar lymph node. There were a few borderline enlarged axillary and porta hepatis lymph nodes. No other parenchymal abnormality was noted. Her white cell count was 9.6, she reported a 20 pound weight loss but no night sweats. Quantiferon gold was negative per patient. Sputum AFB was negative as were blood cultures. She was treated with Zosyn, Levaquin and clindamycin. She was seen by Dr. Meredeth Ide, CT-guided biopsy was scheduled since patient requested to go home and she was discharged on Augmentin 875 twice a day. Repeat CT chest was performed and showed that the left lower lobe mass had decreased slightly in size and cavitation had increased, hence biopsy was deferred and it was decided to follow this lesion to resolution. Meanwhile she continued to have hemoptysis about 1/4 cup per day. Low-grade fevers have persisted on Augmentin, her PCP made an appointment with [place]. I have reviewed her films from [place] and discharge summary. She denies hematuria, burning micturition or foul-smelling sputum production. Is no history of TB contact. She quit smoking in February 2011   Review of Systems  Constitutional: Positive for unexpected weight change. Negative for fever.  HENT: Negative for ear pain, nosebleeds, congestion, sore throat, rhinorrhea, sneezing, trouble swallowing, dental problem, postnasal drip  and sinus pressure.   Eyes: Negative for redness and itching.  Respiratory: Positive for cough. Negative for chest tightness, shortness of breath and wheezing.        Coughing up blood 1/4 cup x 1 day  Cardiovascular: Negative for palpitations and leg swelling.  Gastrointestinal: Negative for nausea and vomiting.  Genitourinary: Negative for dysuria.  Musculoskeletal: Negative for joint swelling.  Skin: Negative for rash.  Neurological: Negative for headaches.  Hematological: Does not bruise/bleed easily.  Psychiatric/Behavioral: Negative for dysphoric mood. The patient is not nervous/anxious.        Objective:   Physical Exam  Gen. Pleasant, well-nourished, in no distress, normal affect ENT - no lesions, no post nasal drip Neck: No JVD, no thyromegaly, no carotid bruits Lungs: no use of accessory muscles, no dullness to percussion, clear without rales or rhonchi  Cardiovascular: Rhythm regular, heart sounds  normal, no murmurs or gallops, no peripheral edema Abdomen: soft and non-tender, no hepatosplenomegaly, BS normal. Musculoskeletal: No deformities, no cyanosis or clubbing Neuro:  alert, non focal Skin- psoriatic patches over elbows        Assessment & Plan:

## 2011-11-16 NOTE — Discharge Instructions (Signed)
Bronchoscopy Care After These instructions give you information on caring for yourself after your procedure. Your doctor may also give you specific instructions. Call your doctor if you have any problems or questions after your procedure. HOME CARE  Do not eat or drink anything for 2 hours after your test. Your nose and throat was numbed by medicine. If you try to eat or drink before the medicine wears off, food or drink could go into your lungs.   For the rest of the first day, eat soft food and drink liquids slowly.   On the day after the test, you can go back to eating your usual food.   Do not drive or sign important papers the day of the test.   Take it easy for the next 2 days. Do not do any heavy work, exercise, or activities.   Only take medicine as told by your doctor. Do not take aspirin.   You may be drowsy for the next 24 hours.   You may see traces of blood in your spit for 1 to 2 days.  Finding out the results of your test Ask when your test results will be ready. Make sure you get your test results. GET HELP RIGHT AWAY IF:  You have breathing problems.   You have a bad sore throat for more than 1 week.   You see traces of blood in your spit for more than 3 days.   You start coughing up blood.   You have a temperature of 102 F (38.9 C) or higher.  MAKE SURE YOU:  Understand these instructions.   Will watch your condition.   Will get help right away if you are not doing well or get worse.  Document Released: 07/09/2009 Document Revised: 05/24/2011 Document Reviewed: 07/09/2009 [facility] Patient Information 2012 [place], [place].

## 2011-11-16 NOTE — Op Note (Signed)
Indication : LLL cavitary lesion with hemoptysis Written informed consent was obtained prior to th eprocedure. The risks of the procedure including coughing, bleeding and the small chance of lung puncture requiring chest tube were discussed in great detail. The benefits & alternatives including serial follow up were also discussed.   2mg  versed & 50 mcg fentnayl used in divided doses. Bronchoscope entered from the right nare. Upper airway nml Vocal cords showed nml appearance & motion. Trachea & bronchial tree examined to the subsegmental level. Nodularity was noted anteriorly in the tracheal mucosa close to the carina. RT sided airways appeared normal. Endobronchial lesion was seen blocking the subsegmental bronchus of the LLL. This was brushed with brisk bleeding.  Hemostasis was achieved with pressure & suctioning. No further biopsies or brushings were obtained due to the brisk bleeding & vascular nature of the lesion observed. Washings were obtained from the left lower lobe. She was awake right after the procedure.

## 2011-11-16 NOTE — Interval H&P Note (Signed)
History and Physical Interval Note:  11/16/2011 8:06 AM  Regina Grimes  has presented today for surgery, with the diagnosis of abnormal xray  The various methods of treatment have been discussed with the patient and family. After consideration of risks, benefits and other options for treatment, the patient has consented to  Procedure(s) (LRB): VIDEO BRONCHOSCOPY WITH FLUORO (Bilateral) as a surgical intervention .  The patients' history has been reviewed, patient examined, no change in status, stable for surgery.  I have reviewed the patients' chart and labs.  Questions were answered to the patient's satisfaction.     [name]

## 2011-11-17 ENCOUNTER — Encounter (HOSPITAL_COMMUNITY): Payer: Self-pay | Admitting: Pulmonary Disease

## 2011-11-18 ENCOUNTER — Other Ambulatory Visit: Payer: Self-pay | Admitting: Pulmonary Disease

## 2011-11-18 DIAGNOSIS — C3490 Malignant neoplasm of unspecified part of unspecified bronchus or lung: Secondary | ICD-10-CM

## 2011-11-18 LAB — CULTURE, BAL-QUANTITATIVE W GRAM STAIN: Colony Count: 15000

## 2011-11-21 ENCOUNTER — Telehealth: Payer: Self-pay | Admitting: Pulmonary Disease

## 2011-11-21 NOTE — Telephone Encounter (Signed)
cx results are all neg so far - I will discuss with pathologist on Thursday & have report ready for her. OK to keep appt even if PET not done. Pl give levaquin 500 x 7 more days

## 2011-11-21 NOTE — Telephone Encounter (Signed)
Spoke with the pt and she is asking for results from bronch. She also states that he mentioned having a PET scan done before she sees him this Friday. The order was placed but nothing had been scheduled yet so I spoke with [facility] about this and they are working on it however it is not going to be done before her appt on Friday. Does she need to r/s her Friday appt? Also she states she has one tablet left of the abx, does she need to continue the abx? Please advise. Carron Curie, CMA Allergies  Allergen Reactions  . Aspirin     REACTION: stomach burning  . Codeine     Upset stomach  . Pravastatin Sodium     REACTION: mood changes and snapping at everyone

## 2011-11-22 ENCOUNTER — Telehealth: Payer: Self-pay | Admitting: Pulmonary Disease

## 2011-11-22 ENCOUNTER — Other Ambulatory Visit: Payer: Self-pay | Admitting: Pulmonary Disease

## 2011-11-22 MED ORDER — LEVOFLOXACIN 500 MG PO TABS: 500.0000 mg | ORAL_TABLET | Freq: Every day | ORAL | Status: DC

## 2011-11-22 NOTE — Telephone Encounter (Signed)
Medication sent and pt aware 

## 2011-11-22 NOTE — Telephone Encounter (Signed)
I informed pt of RA's findings and recommendations. Pt verbalized understanding  

## 2011-11-24 ENCOUNTER — Ambulatory Visit (INDEPENDENT_AMBULATORY_CARE_PROVIDER_SITE_OTHER): Payer: 59 | Admitting: Pulmonary Disease

## 2011-11-24 ENCOUNTER — Encounter: Payer: Self-pay | Admitting: Pulmonary Disease

## 2011-11-24 VITALS — BP 130/66 | HR 73 | Temp 98.4°F | Ht 65.0 in | Wt 130.4 lb

## 2011-11-24 DIAGNOSIS — J852 Abscess of lung without pneumonia: Secondary | ICD-10-CM

## 2011-11-24 DIAGNOSIS — C3490 Malignant neoplasm of unspecified part of unspecified bronchus or lung: Secondary | ICD-10-CM

## 2011-11-24 DIAGNOSIS — Z85118 Personal history of other malignant neoplasm of bronchus and lung: Secondary | ICD-10-CM | POA: Insufficient documentation

## 2011-11-24 MED ORDER — LEVOFLOXACIN 500 MG PO TABS: ORAL_TABLET | ORAL | Status: DC

## 2011-11-24 NOTE — Assessment & Plan Note (Signed)
Adenocarcinoma - endobronchial lesion LLL Unclear staging - Note lt hilar Ln enlarged on CT, this would make it stg 3A - however this could also be due to post obstructive pna. Proceed with PET scan  & PFTs If PET neg & adequate lung function, will refer to thoracic surgery - otherwise to Oncology IMplications of diagnosis & staging were discussed with the patient & husband in detail.

## 2011-11-24 NOTE — Progress Notes (Signed)
  Subjective:    Patient ID: Regina Grimes, female    DOB: [birth year], 61 y.o.   MRN: [id number]  HPI PCP - Alphonsus Sias  61 year old ex-smoker, presents for FU of hemoptysis and LLL cavitary lesion She is on weekly methotrexate for psoriasis and has not taken this in 3 weeks. She presented to [facility] on 11/05/2011 with hemoptysis for 3-4 days, low-grade fever and dyspnea. Chest x-ray showed a cavitary mass in the left lower lobe measuring 5 cm. CT chest with contrast showed a thickwalled cavitary mass medially in the left lower lobe measuring 5.3x3.9x5.6 cm with an enlarged left hilar lymph node. There were a few borderline enlarged axillary and porta hepatis lymph nodes. No other parenchymal abnormality was noted. Her white cell count was 9.6, she reported a 20 pound weight loss but no night sweats. Quantiferon gold was negative per patient. Sputum AFB was negative as were blood cultures. She was treated with Zosyn, Levaquin and clindamycin. She was seen by Dr. Meredeth Ide, CT-guided biopsy was scheduled since patient requested to go home and she was discharged on Augmentin 875 twice a day. Repeat CT chest was performed and showed that the left lower lobe mass had decreased slightly in size and cavitation had increased, hence biopsy was deferred and it was decided to follow this lesion to resolution.  Meanwhile she continued to have hemoptysis about 1/4 cup per day. Low-grade fevers have persisted on Augmentin, her PCP made an appointment with [place]. I have reviewed her films from [place] and discharge summary.  She denies hematuria, burning micturition or foul-smelling sputum production. Is no history of TB contact. She quit smoking in February 2011   11/24/2011 Took levaquin x 10 days  Bscopy showed endobronchial lesion in LLL with brisk bleeding on brushing 1 week follow-up. Patient states better since last visit. c/o a little sob and cough with white mucus. Brushings >>  adenocarcinoma. Hemoptysis has stopped, breathing has improved, no fevers  Denies chest pain,chest tightness, and wheezing.   Review of Systems Patient denies significant dyspnea,cough, hemoptysis,  chest pain, palpitations, pedal edema, orthopnea, paroxysmal nocturnal dyspnea, lightheadedness, nausea, vomiting, abdominal or  leg pains       Objective:   Physical Exam  Gen. Pleasant, well-nourished, in no distress, normal affect ENT - no lesions, no post nasal drip Neck: No JVD, no thyromegaly, no carotid bruits Lungs: no use of accessory muscles, no dullness to percussion, decreased without rales or rhonchi  Cardiovascular: Rhythm regular, heart sounds  normal, no murmurs or gallops, no peripheral edema Abdomen: soft and non-tender, no hepatosplenomegaly, BS normal. Musculoskeletal: No deformities, no cyanosis or clubbing Neuro:  alert, non focal        Assessment & Plan:

## 2011-11-24 NOTE — Assessment & Plan Note (Signed)
Ct levaquin for post obstructive process until PET obtained

## 2011-11-24 NOTE — Patient Instructions (Signed)
PET scan Lung function test Based on above , we will refer you to either thoracic surgery or oncology Stay on levaquin for another 7days until PEt scan

## 2011-11-30 ENCOUNTER — Telehealth: Payer: Self-pay | Admitting: Pulmonary Disease

## 2011-11-30 NOTE — Telephone Encounter (Signed)
Regina Grimes called [facility]- spoke to rene who advised that this was already done about 30 mins. Ago. Nothing further needed.

## 2011-12-01 ENCOUNTER — Telehealth: Payer: Self-pay | Admitting: Pulmonary Disease

## 2011-12-01 ENCOUNTER — Encounter (HOSPITAL_COMMUNITY)
Admission: RE | Admit: 2011-12-01 | Discharge: 2011-12-01 | Disposition: A | Discharge status: Home / Self Care | Payer: 59 | Source: Ambulatory Visit | Attending: Pulmonary Disease | Admitting: Pulmonary Disease

## 2011-12-01 ENCOUNTER — Encounter (HOSPITAL_COMMUNITY): Payer: Self-pay

## 2011-12-01 DIAGNOSIS — C3490 Malignant neoplasm of unspecified part of unspecified bronchus or lung: Secondary | ICD-10-CM

## 2011-12-01 MED ORDER — FLUDEOXYGLUCOSE F - 18 (FDG) INJECTION
17.5000 | Freq: Once | INTRAVENOUS | Status: AC | PRN
  Administered 2011-12-01: 17.5 via INTRAVENOUS

## 2011-12-01 NOTE — Telephone Encounter (Signed)
Result Notes     Notes Recorded by Oretha Milch, MD on 12/01/2011 at 5:32 PM Pet scan showed uptake in the lesion but not anywhere else. Referral has been made to multi disc lung cancer clinic for surgeon as discussed with her during OV - await PFTs   I spoke with patient and her spouse regarding this results; I will send message to RA to advise if it looks like infection has cleared. Will be Monday before an answer can be given. Will send to RA and myself to keep an eye out for a response. Pt and spouse aware and okay with this.

## 2011-12-02 NOTE — Telephone Encounter (Signed)
No need for more abx after current course finished

## 2011-12-04 NOTE — Telephone Encounter (Signed)
RA; patient is aware of no need for more abx; however she is asking about when and where referral to surgeon will be made. She has PFT on 12-11-2011 at 4pm. I told her that I would check with you as sometimes we like to make referral after all tests have been done. I do not see a referral placed in EPIC at this time. Please advise. Thanks.

## 2011-12-04 NOTE — Telephone Encounter (Signed)
Spoke with patient and we scheduled Full PFT Before and After at [facility] for Tues. 12/05/11 at 2:00. Pt will arrive at 1:45 and check in at admissions. Pt was given PFT instruction sheet and is aware of instructions. PFT here on 12/11/11 has been cancelled. Pt is aware of appointment date, time and location.

## 2011-12-04 NOTE — Telephone Encounter (Signed)
Pt returned Leslie's call.  Regina Grimes ° °

## 2011-12-04 NOTE — Telephone Encounter (Signed)
Spoke with patient-aware that [facility] nurse will call her about appt and agrees to have PFT sooner than 12-11-2011; Bjorn Loser and I spoke regarding this patient-she will get sooner appt at [facility] if possible and inform patient of date and time.

## 2011-12-04 NOTE — Telephone Encounter (Signed)
LMTCB

## 2011-12-04 NOTE — Telephone Encounter (Signed)
[facility] nurse will call her for appt- may be as soon as 3/14 Pl ask [facility] to expedite pfts if possible

## 2011-12-05 ENCOUNTER — Ambulatory Visit (HOSPITAL_COMMUNITY)
Admission: RE | Admit: 2011-12-05 | Discharge: 2011-12-05 | Disposition: A | Discharge status: Home / Self Care | Payer: 59 | Source: Ambulatory Visit | Attending: Pulmonary Disease | Admitting: Pulmonary Disease

## 2011-12-05 DIAGNOSIS — R0609 Other forms of dyspnea: Secondary | ICD-10-CM | POA: Insufficient documentation

## 2011-12-05 DIAGNOSIS — C3490 Malignant neoplasm of unspecified part of unspecified bronchus or lung: Secondary | ICD-10-CM

## 2011-12-05 DIAGNOSIS — R0602 Shortness of breath: Secondary | ICD-10-CM | POA: Insufficient documentation

## 2011-12-05 DIAGNOSIS — R0989 Other specified symptoms and signs involving the circulatory and respiratory systems: Secondary | ICD-10-CM | POA: Insufficient documentation

## 2011-12-05 DIAGNOSIS — J988 Other specified respiratory disorders: Secondary | ICD-10-CM | POA: Insufficient documentation

## 2011-12-05 LAB — PULMONARY FUNCTION TEST

## 2011-12-05 MED ORDER — ALBUTEROL SULFATE (5 MG/ML) 0.5% IN NEBU
2.5000 mg | INHALATION_SOLUTION | Freq: Once | RESPIRATORY_TRACT | Status: AC
  Administered 2011-12-05: 2.5 mg via RESPIRATORY_TRACT

## 2011-12-07 ENCOUNTER — Telehealth: Payer: Self-pay | Admitting: Pulmonary Disease

## 2011-12-07 NOTE — Telephone Encounter (Signed)
Spoke with patient today regarding "an appointment that was to be scheduled for her" and she was calling to inquire about appointment b/c she hadn't heard anything. According to the last ov note, patient instructions were "based on the results of pending test" we would refer to either surgeon or oncology. No referral was sent to [facility], however, patient was scheduled by [facility] per Dana's instructions with an appointment for today (Thurs. 12/07/11 at 3:00). Pt was unaware of this appointment. I called over to [facility] ([phone]) and was advised that [facility] had scheduled appointment per Dana's request, however, no one didn't contacted the patient and the patient did not know about the appointment. Appointment has been r/s to [employer]. 12/14/11 at 2:00 at [facility]. Pt is aware of this appointment and we are investigating as to how Annabelle Harman knew to schedule and why patient wasn't contacted.  Patient has a return appointment with Rubye Oaks, NP on 12/12/11 and was wanting to know if she needed to keep this appointment or if this could be cancelled since she was to be seen at [facility] on 12/14/11. Pt stated that she has been contacted about her PET scan results and also her PFT results.  Please contact patient and advise.

## 2011-12-08 ENCOUNTER — Telehealth: Payer: Self-pay | Admitting: Pulmonary Disease

## 2011-12-08 NOTE — Telephone Encounter (Signed)
I spoke with patient about results and she verbalized understanding and had no questions 

## 2011-12-08 NOTE — Telephone Encounter (Signed)
I called to tell her PET results  But was unable to reach her.. The PET results were conveyed to husband - pl see result note. I made appt with [facility] & emailed Annabelle Harman. She will see surgeon there.

## 2011-12-08 NOTE — Telephone Encounter (Signed)
lmomtcb x1 

## 2011-12-08 NOTE — Telephone Encounter (Signed)
Reviewed PFTs >> Mild airway obstruction FEV1 78% (post BD) Diffusion 66% Smoking did affect her lungs, but can undergo surgery if needed

## 2011-12-08 NOTE — Telephone Encounter (Signed)
Spoke with pt and advised okay to cancel appt with [facility]. Appt cancelled. She is asking about PET results, I advised that these will given at appt with [facility]. Pt verbalized understanding, but would like results sooner if at all possible. I advised RA not in the office this wk, but will send him msg to let him know she is inquiring about these results. She is aware may not get a call back regarding this, and may need to wait until [facility] appt. RA, please advise thanks

## 2011-12-08 NOTE — Telephone Encounter (Signed)
Pt returning call.Regina Grimes ° °

## 2011-12-08 NOTE — Telephone Encounter (Signed)
Ok to cancel ov with [name]  Pending [facility] recommendations please have to follow up [name]  After to make sure she has cleared with her pulmonary symptoms .  Please contact office for sooner follow up if symptoms do not improve or worsen or seek emergency care '

## 2011-12-08 NOTE — Telephone Encounter (Signed)
Per RA nothing further needed and will sign off message

## 2011-12-12 ENCOUNTER — Ambulatory Visit: Payer: 59 | Admitting: Adult Health

## 2011-12-13 ENCOUNTER — Telehealth: Payer: Self-pay | Admitting: *Deleted

## 2011-12-13 NOTE — Telephone Encounter (Signed)
Spoke with pt regarding appt tomorrow 12/14/11 at [facility] office.  Gave directions

## 2011-12-14 ENCOUNTER — Encounter: Payer: Self-pay | Admitting: *Deleted

## 2011-12-14 ENCOUNTER — Encounter: Payer: Self-pay | Admitting: Surgery

## 2011-12-14 ENCOUNTER — Institutional Professional Consult (permissible substitution) (INDEPENDENT_AMBULATORY_CARE_PROVIDER_SITE_OTHER): Payer: 59 | Admitting: Surgery

## 2011-12-14 VITALS — BP 160/91 | HR 92 | Temp 99.1°F | Resp 20 | Ht 65.0 in | Wt 130.0 lb

## 2011-12-14 NOTE — Progress Notes (Signed)
Spoke with pt at [facility] 12/14/11.  Dr. Laneta Simmers unable to attended due to emergency.  Pt rescheduled for tomorrow.  Pt verbalized understanding.

## 2011-12-15 ENCOUNTER — Other Ambulatory Visit: Payer: Self-pay

## 2011-12-15 ENCOUNTER — Institutional Professional Consult (permissible substitution) (INDEPENDENT_AMBULATORY_CARE_PROVIDER_SITE_OTHER): Payer: 59 | Admitting: Surgery

## 2011-12-15 ENCOUNTER — Encounter (HOSPITAL_COMMUNITY): Payer: Self-pay | Admitting: Pharmacy Technician

## 2011-12-15 ENCOUNTER — Encounter: Payer: Self-pay | Admitting: Surgery

## 2011-12-15 VITALS — BP 165/100 | HR 102 | Resp 20 | Ht 65.0 in | Wt 130.0 lb

## 2011-12-15 DIAGNOSIS — R222 Localized swelling, mass and lump, trunk: Secondary | ICD-10-CM

## 2011-12-15 DIAGNOSIS — C3430 Malignant neoplasm of lower lobe, unspecified bronchus or lung: Secondary | ICD-10-CM

## 2011-12-15 LAB — FUNGUS CULTURE W SMEAR: Fungal Smear: NONE SEEN

## 2011-12-18 ENCOUNTER — Ambulatory Visit (HOSPITAL_COMMUNITY)
Admission: RE | Admit: 2011-12-18 | Discharge: 2011-12-18 | Disposition: A | Discharge status: Home / Self Care | Payer: 59 | Source: Ambulatory Visit | Attending: Surgery | Admitting: Surgery

## 2011-12-18 ENCOUNTER — Other Ambulatory Visit: Payer: Self-pay

## 2011-12-18 ENCOUNTER — Encounter (HOSPITAL_COMMUNITY): Payer: Self-pay

## 2011-12-18 ENCOUNTER — Encounter (HOSPITAL_COMMUNITY)
Admission: RE | Admit: 2011-12-18 | Discharge: 2011-12-18 | Disposition: A | Discharge status: Home / Self Care | Payer: 59 | Source: Ambulatory Visit | Attending: Surgery | Admitting: Surgery

## 2011-12-18 VITALS — BP 155/88 | HR 74 | Temp 98.3°F | Resp 20 | Ht 69.0 in | Wt 131.8 lb

## 2011-12-18 DIAGNOSIS — Z01810 Encounter for preprocedural cardiovascular examination: Secondary | ICD-10-CM | POA: Insufficient documentation

## 2011-12-18 DIAGNOSIS — J984 Other disorders of lung: Secondary | ICD-10-CM | POA: Insufficient documentation

## 2011-12-18 DIAGNOSIS — C3430 Malignant neoplasm of lower lobe, unspecified bronchus or lung: Secondary | ICD-10-CM

## 2011-12-18 DIAGNOSIS — Z01812 Encounter for preprocedural laboratory examination: Secondary | ICD-10-CM | POA: Insufficient documentation

## 2011-12-18 DIAGNOSIS — Z01818 Encounter for other preprocedural examination: Secondary | ICD-10-CM | POA: Insufficient documentation

## 2011-12-18 HISTORY — DX: Malignant (primary) neoplasm, unspecified: C80.1

## 2011-12-18 HISTORY — DX: Essential (primary) hypertension: I10

## 2011-12-18 HISTORY — DX: Acute upper respiratory infection, unspecified: J06.9

## 2011-12-18 HISTORY — DX: Hemoptysis: R04.2

## 2011-12-18 LAB — PROTIME-INR
INR: 1.03 (ref 0.00–1.49)
Prothrombin Time: 13.7 seconds (ref 11.6–15.2)

## 2011-12-18 LAB — COMPREHENSIVE METABOLIC PANEL
ALT: 8 U/L (ref 0–35)
AST: 13 U/L (ref 0–37)
Albumin: 3.1 g/dL — ABNORMAL LOW (ref 3.5–5.2)
Alkaline Phosphatase: 84 U/L (ref 39–117)
BUN: 10 mg/dL (ref 6–23)
CO2: 23 mEq/L (ref 19–32)
Calcium: 9.5 mg/dL (ref 8.4–10.5)
Chloride: 100 mEq/L (ref 96–112)
Creatinine, Ser: 0.53 mg/dL (ref 0.50–1.10)
GFR calc Af Amer: >90 mL/min (ref >90)
GFR calc non Af Amer: >90 mL/min (ref >90)
Glucose, Bld: 100 mg/dL — ABNORMAL HIGH (ref 70–99)
Potassium: 3.5 mEq/L (ref 3.5–5.1)
Sodium: 135 mEq/L (ref 135–145)
Total Bilirubin: 0.2 mg/dL — ABNORMAL LOW (ref 0.3–1.2)
Total Protein: 7 g/dL (ref 6.0–8.3)

## 2011-12-18 LAB — TYPE AND SCREEN
ABO/RH(D): O POS
Antibody Screen: NEGATIVE

## 2011-12-18 LAB — CBC
HCT: 31.5 % — ABNORMAL LOW (ref 36.0–46.0)
Hemoglobin: 10.3 g/dL — ABNORMAL LOW (ref 12.0–15.0)
MCH: 27.3 pg (ref 26.0–34.0)
MCHC: 32.7 g/dL (ref 30.0–36.0)
MCV: 83.6 fL (ref 78.0–100.0)
Platelets: 340 10*3/uL (ref 150–400)
RBC: 3.77 MIL/uL — ABNORMAL LOW (ref 3.87–5.11)
RDW: 13.4 % (ref 11.5–15.5)
WBC: 10.1 10*3/uL (ref 4.0–10.5)

## 2011-12-18 LAB — BLOOD GAS, ARTERIAL
Acid-Base Excess: 1.9 mmol/L (ref 0.0–2.0)
Bicarbonate: 25.9 mEq/L — ABNORMAL HIGH (ref 20.0–24.0)
Drawn by: 206261
FIO2: 0.21 %
O2 Saturation: 98.5 %
Patient temperature: 98.6
TCO2: 27.1 mmol/L (ref 0–100)
pCO2 arterial: 40 mmHg (ref 35.0–45.0)
pH, Arterial: 7.427 — ABNORMAL HIGH (ref 7.350–7.400)
pO2, Arterial: 98.2 mmHg (ref 80.0–100.0)

## 2011-12-18 LAB — URINALYSIS, ROUTINE W REFLEX MICROSCOPIC
Bilirubin Urine: NEGATIVE
Glucose, UA: NEGATIVE mg/dL
Hgb urine dipstick: NEGATIVE
Protein, ur: NEGATIVE mg/dL
Urobilinogen, UA: 0.2 mg/dL (ref 0.0–1.0)

## 2011-12-18 LAB — URINE MICROSCOPIC-ADD ON

## 2011-12-18 LAB — SURGICAL PCR SCREEN
MRSA, PCR: NEGATIVE
Staphylococcus aureus: NEGATIVE

## 2011-12-18 LAB — ABO/RH: ABO/RH(D): O POS

## 2011-12-18 NOTE — Pre-Procedure Instructions (Signed)
20 JALIYA SIEGMANN  12/18/2011   Your procedure is scheduled on:  12/21/2011  Report to [facility] at 7:15 AM.  Call this number if you have problems the morning of surgery: [phone]   Remember:   Do not eat food:After Midnight.  May have clear liquids: up to 4 Hours before arrival.  Nothing after 3:00a.m.  Clear liquids include soda, tea, black coffee, apple or grape juice, broth.  Take these medicines the morning of surgery with A SIP OF WATER:  NOTHING   Do not wear jewelry, make-up or nail polish.  Do not wear lotions, powders, or perfumes. You may wear deodorant.  Do not shave 48 hours prior to surgery.  Do not bring valuables to the hospital.  Contacts, dentures or bridgework may not be worn into surgery.  Leave suitcase in the car. After surgery it may be brought to your room.  For patients admitted to the hospital, checkout time is 11:00 AM the day of discharge.   Patients discharged the day of surgery will not be allowed to drive home.  Name and phone number of your driver: with Spouse  Special Instructions: CHG Shower Use Special Wash: 1/2 bottle night before surgery and 1/2 bottle morning of surgery.   Please read over the following fact sheets that you were given: Pain Booklet, Coughing and Deep Breathing, Blood Transfusion Information, MRSA Information and Surgical Site Infection Prevention

## 2011-12-18 NOTE — Progress Notes (Signed)
Will leave chart for Anesth. Review due to EKG

## 2011-12-19 ENCOUNTER — Encounter: Payer: Self-pay | Admitting: Surgery

## 2011-12-19 NOTE — Consult Note (Signed)
Anesthesia:  Patient is a 61 year old female scheduled for a flexible bronchoscopy, left thoracotomy, LLL on 12/21/11.  History includes lung CA with history of hemoptysis, psoriasis, arthritis, HLD, GERD, HH, former smoker.  PCP is Dr. Alphonsus Sias.  Her Pulmonologist is [name].  According to his 12/08/11 notes, her PFTs showed "Mild airway obstruction FEV1 78% (post BD). Diffusion 66%."  He felt she could tolerate this surgical procedure.   CXR on 12/18/11 showed: Unchanged left lower lobe mass with left lower lobe volume loss and probable basilar atelectasis. Faint patchy density in the left lower lobe makes postobstructive pneumonia difficult to exclude.   Labs reviewed.  H/H 10.3/31.5.  T&S already done.  EKG yesterday showed NSR, possible anterior infarct (age undetermined).  Has low r waves when compared to her EKG from 02/04/07.  No CV symptoms reported.  Clinical correlation on the day of surgery, but anticipate she can proceed if she remains asymptomatic.

## 2011-12-19 NOTE — Progress Notes (Signed)
                  [address]            [name] [address]          [phone]      PCP is Richard Letvak, MD, MD  Referring Provider is Alva, Rakesh V, MD  Chief Complaint   Patient presents with   .  Lung Mass     Referral from Dr Alva for eval on Lung Mass, PET Scan 12/01/2011   HPI:  61-year-old ex-smoker, presents for evaluation of hemoptysis and abnormal imaging.  She is on weekly methotrexate for psoriasis and has not taken this in 3 weeks. She presented to [facility] on 11/05/2011 with hemoptysis for 3-4 days, low-grade fever and dyspnea. Chest x-ray showed a cavitary mass in the left lower lobe measuring 5 cm. CT chest with contrast showed a thickwalled cavitary mass medially in the left lower lobe measuring 5.3x3.9x5.6 cm with an enlarged left hilar lymph node. There were a few borderline enlarged axillary and porta hepatis lymph nodes. No other parenchymal abnormality was noted. Her white cell count was 9.6, she reported a 20 pound weight loss but no night sweats. Quantiferon gold was negative per patient. Sputum AFB was negative as were blood cultures. She was treated with Zosyn, Levaquin and clindamycin. She was seen by Dr. Fleming, CT-guided biopsy was scheduled since patient requested to go home and she was discharged on Augmentin 875 twice a day. Repeat CT chest was performed and showed that the left lower lobe mass had decreased slightly in size and cavitation had increased, hence biopsy was deferred and it was decided to follow this lesion to resolution.  Meanwhile she continued to have hemoptysis about 1/4 cup per day. Low-grade fevers had persisted on Augmentin. She was therefore referred to pulmonary medicine. Bronchoscopy was performed which showed an endobronchial lesion within the subsegmental bronchus of the left lower lobe causing obstruction there. This was very friable and with brushing there was brisk bleeding. Hemostasis was  achieved and no further biopsies or brushings were taken. 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  Past Medical History   Diagnosis  Date   .  Psoriasis      has used methotrexate since she was 61y.o., when psoriasis flares. Currently methotrexate on hold since 11/05/2011   .  Hyperlipidemia    .  Osteopenia    .  GERD (gastroesophageal reflux disease)    .  Arthritis      arm   .  Hiatal hernia    .  Hypertension      treated for while in hosp. 10/2011, given med. , unsure of name of med., not told to continue   .  Coughing up blood      10/2011, admitted to [place]   .  Recurrent upper respiratory infection (URI)      lung infection, treated /w antibiotic - 10/2011   .  Cancer      lung cancer    Past Surgical History   Procedure  Date   .  Colonoscopy  8/02   .  Esophagogastroduodenoscopy  8/02   .  Tubal ligation  1982   .  Benign tumors of left foot  1992/1994   .  Vd      x 2   .  Abnormal pap      low grade intrepithelial lesion. Colposcipy   okay   .  Video bronchoscopy  11/16/2011     Procedure: VIDEO BRONCHOSCOPY WITH FLUORO; Surgeon: Rakesh V. Alva, MD; Location: [facility]; Service: Cardiopulmonary; Laterality: Bilateral;   .  Foot neuroma surgery      L foot- 1990's   .  Appendectomy      1982- along /w tubal ligation    Family History   Problem  Relation  Age of Onset   .  Coronary artery disease  Mother    .  Lung cancer  Father    .  Breast cancer  Paternal Aunt    .  Cirrhosis  Maternal Grandmother    .  Anesthesia problems  Neg Hx    Social History  History   Substance Use Topics   .  Smoking status:  Former Smoker -- 1.0 packs/day for 25 years     Types:  Cigarettes     Quit date:  06/25/2010   .  Smokeless tobacco:  Never Used   .  Alcohol Use:  No    Current Outpatient Prescriptions   Medication  Sig  Dispense  Refill   .  ergocalciferol (VITAMIN D2) 50000 UNITS capsule  Take 50,000 Units by mouth every 30 (thirty)  days.     .  folic acid (FOLVITE) 1 MG tablet  Take 1 mg by mouth daily.     .  acetaminophen (TYLENOL) 325 MG tablet  Take 325 mg by mouth every 6 (six) hours as needed. For pain     .  bisacodyl (DULCOLAX) 5 MG EC tablet  Take 5 mg by mouth daily as needed.     .  calcium carbonate (TUMS EX) 750 MG chewable tablet  Chew 1 tablet by mouth daily as needed.     .  fluocinonide ointment (LIDEX) 0.05 %  Apply 1 application topically 2 (two) times daily as needed. For psoriasis     .  methotrexate (RHEUMATREX) 15 MG tablet  Take 15 mg by mouth once a week. Caution: Chemotherapy. Protect from light., takes for psoriasis  STOPPED- Feb. 2013, due to lung ca. , ? restarting     .  Probiotic Product (PROBIOTIC PO)  Take 1 capsule by mouth daily.      Allergies   Allergen  Reactions   .  Aspirin      REACTION: stomach burning   .  Codeine      Upset stomach   .  Pravastatin Sodium      REACTION: mood changes and snapping at everyone   Review of Systems:  Gen.: She denies any fever or chills. Appetite has been decreased and she has had significant weight loss. She denies fatigue.  Eyes: Negative  ENT: Negative  Endocrine: Denies diabetes and hypothyroidism.  Cardiovascular: She denies any chest pain or pressure. She denies exertional dyspnea, orthopnea, and PND. She's had no peripheral edema or palpitations.  Respiratory: She does report cough and hemoptysis of about one quarter cup at a time. She has not had hemoptysis over the past week.  GI: she denies nausea and vomiting. She has had no dysphagia. She denies melena and bright red blood per rectum. She does have a history of hiatal hernia and reflux.  GU: She denies dysuria and hematuria.  Neurological: Denies any headaches. She denies any focal weakness or numbness. She denies dizziness and syncope. She's never had a TIA or stroke.  Musculoskeletal: She denies arthralgias and myalgias.  Hematological: She denies any history   of bleeding  disorders or easy bleeding.  Vascular: She denies any history of DVT or phlebitis.  BP 165/100  Pulse 102  Resp 20  Ht 5' 5" (1.651 m)  Wt 130 lb (58.968 kg)  BMI 21.63 kg/m2  SpO2 97%  Physical Exam:  She is a well-developed white female in no distress.  HEENT: Normocephalic and atraumatic. Pupils are equal and reactive to light and accommodation. Extraocular muscle are intact. Oropharynx is clear.  Neck: Carotid pulses are palpable bilaterally. No bruits. There is no cervical or supraclavicular adenopathy.  Lungs: Clear  Heart: Regular rate and rhythm with normal S1 and S2. There is no murmur, rub, or gallop.  Abdomen: Bowel sounds are present. Soft and nontender. No palpable masses or organomegaly.  Extremities: Pink and warm. Pedal pulses are palpable bilaterally.  Neurological: Alert and oriented x3. Motor and sensory exam is grossly normal.  Skin: She has psoriasis.  Diagnostic Tests:  NUCLEAR MEDICINE PET CT INITIAL (PI) SKULL BASE TO THIGH  Technique: 17.5 mCi F-18 FDG was injected intravenously via the  right antecubital. Full-ring PET imaging was performed from the  skull base through the mid-thighs 70 minutes after injection. CT  data was obtained and used for attenuation correction and anatomic  localization only. (This was not acquired as a diagnostic CT  examination.)  Fasting Blood Glucose: 96  Patient Weight: 130 pounds.  Comparison: Chest x-ray 11/15/2011.  Findings: The large cavitary lesion in the left lower lobe  demonstrates neoplastic range FDG uptake with SUV max of 68.  There are small scattered mediastinal and hilar lymph nodes  but no abnormal FDG uptake is identified. No metastatic  pulmonary nodules. No findings for osseous metastasis. The  abdomen and pelvis demonstrate no findings for metastatic  disease.  IMPRESSION:  1. Markedly FDG avid cavitary left lower lobe lung mass.  2. No mediastinal or hilar lymphadenopathy and no findings for    metastatic disease involving the lungs, bones, abdomen or pelvis.  Original Report Authenticated By: P. MARK GALLERANI, M.D.  Patient Name: Regina Grimes, Regina Grimes Accession #: [id number]  DOB: 11/09/1950 Age: 60 Gender: F Client: [facility]  Collected Date: 11/16/2011 Rec'd Date: 11/16/2011 Physician: Rakesh Alva, MD  Chart #: MRN #: [id number] Physician cc:  LMP: Visit #: [id number]  CYTOPATHOLOGY REPORT  Adequacy Reason  Satisfactory For Evaluation.  Diagnosis  BRONCHIAL BRUSHING, LEFT LOWER LOBE:  MALIGNANT CELLS CONSISTENT WITH ADENOCARCINOMA.  JOHN PATRICK MD  Pathologist, Electronic Signature  (Case signed 11/17/2011)  Impression:  She has a large cavitary mass in the left lower lobe that appears to be an adenocarcinoma. There is no evidence of metastatic disease by PET scan. She has had significant hemoptysis and I recommended that we proceed with bronchoscopy followed by left thoracotomy and left lower lobectomy. Her pulmonary function is adequate to tolerate lobectomy. I discussed the operative procedure with the patient and her husband including alternatives, benefits, and risks including but not limited to bleeding, blood transfusion, infection, bronchial stump complications, prolonged air leak, respiratory failure, the possibility of incomplete resection, and she understands and agrees to proceed. All of her questions have been answered.  Plan:  We'll schedule surgery for Thursday, 12/21/2011.     

## 2011-12-20 MED ORDER — CEFUROXIME SODIUM 1.5 G IJ SOLR
1.5000 g | INTRAMUSCULAR | Status: AC
  Administered 2011-12-21: 1.5 g via INTRAVENOUS
  Filled 2011-12-20: qty 1.5

## 2011-12-21 ENCOUNTER — Encounter (HOSPITAL_COMMUNITY): Payer: Self-pay | Admitting: Vascular Surgery

## 2011-12-21 ENCOUNTER — Ambulatory Visit (HOSPITAL_COMMUNITY): Payer: 59

## 2011-12-21 ENCOUNTER — Ambulatory Visit (HOSPITAL_COMMUNITY): Payer: 59 | Admitting: Vascular Surgery

## 2011-12-21 ENCOUNTER — Inpatient Hospital Stay (HOSPITAL_COMMUNITY)
Admission: RE | Admit: 2011-12-21 | Discharge: 2011-12-26 | DRG: 164 | Disposition: A | Discharge status: Home / Self Care | Payer: 59 | Source: Ambulatory Visit | Attending: Surgery | Admitting: Surgery

## 2011-12-21 ENCOUNTER — Encounter (HOSPITAL_COMMUNITY)
Admission: RE | Disposition: A | Discharge status: Home / Self Care | Payer: Self-pay | Source: Ambulatory Visit | Attending: Surgery

## 2011-12-21 DIAGNOSIS — C3430 Malignant neoplasm of lower lobe, unspecified bronchus or lung: Principal | ICD-10-CM

## 2011-12-21 DIAGNOSIS — D62 Acute posthemorrhagic anemia: Secondary | ICD-10-CM | POA: Diagnosis present

## 2011-12-21 DIAGNOSIS — M899 Disorder of bone, unspecified: Secondary | ICD-10-CM | POA: Diagnosis present

## 2011-12-21 DIAGNOSIS — Z87891 Personal history of nicotine dependence: Secondary | ICD-10-CM

## 2011-12-21 HISTORY — PX: VIDEO BRONCHOSCOPY: SHX5072

## 2011-12-21 SURGERY — LOBECTOMY, LUNG, OPEN
Anesthesia: General | Site: Chest | Wound class: Clean Contaminated

## 2011-12-21 MED ORDER — ONDANSETRON HCL 4 MG/2ML IJ SOLN: 4.0000 mg | Freq: Once | INTRAMUSCULAR | Status: DC | PRN

## 2011-12-21 MED ORDER — DIPHENHYDRAMINE HCL 12.5 MG/5ML PO ELIX
12.5000 mg | ORAL_SOLUTION | Freq: Four times a day (QID) | ORAL | Status: DC | PRN
  Filled 2011-12-21: qty 5

## 2011-12-21 MED ORDER — OXYCODONE-ACETAMINOPHEN 5-325 MG PO TABS
1.0000 | ORAL_TABLET | ORAL | Status: DC | PRN
  Administered 2011-12-25 – 2011-12-26 (×4): 1 via ORAL
  Filled 2011-12-21 (×4): qty 1

## 2011-12-21 MED ORDER — OXYCODONE HCL 5 MG PO TABS: 5.0000 mg | ORAL_TABLET | ORAL | Status: AC | PRN

## 2011-12-21 MED ORDER — ONDANSETRON HCL 4 MG/2ML IJ SOLN
4.0000 mg | Freq: Four times a day (QID) | INTRAMUSCULAR | Status: DC | PRN
  Administered 2011-12-22: 4 mg via INTRAVENOUS
  Filled 2011-12-21: qty 2

## 2011-12-21 MED ORDER — SENNOSIDES-DOCUSATE SODIUM 8.6-50 MG PO TABS
1.0000 | ORAL_TABLET | Freq: Every evening | ORAL | Status: DC | PRN
  Filled 2011-12-21: qty 1

## 2011-12-21 MED ORDER — BUPIVACAINE ON-Q PAIN PUMP (FOR ORDER SET NO CHG): INJECTION | Status: DC

## 2011-12-21 MED ORDER — NEOSTIGMINE METHYLSULFATE 1 MG/ML IJ SOLN
INTRAMUSCULAR | Status: DC | PRN
  Administered 2011-12-21: 3 mg via INTRAVENOUS

## 2011-12-21 MED ORDER — BUPIVACAINE 0.5 % ON-Q PUMP SINGLE CATH 400 ML
400.0000 mL | INJECTION | Status: DC
  Filled 2011-12-21: qty 400

## 2011-12-21 MED ORDER — BUPIVACAINE 0.5 % ON-Q PUMP SINGLE CATH 400 ML
400.0000 mL | INJECTION | Status: DC
  Filled 2011-12-21 (×2): qty 400

## 2011-12-21 MED ORDER — GLYCOPYRROLATE 0.2 MG/ML IJ SOLN
INTRAMUSCULAR | Status: DC | PRN
  Administered 2011-12-21: .5 mg via INTRAVENOUS

## 2011-12-21 MED ORDER — DIPHENHYDRAMINE HCL 50 MG/ML IJ SOLN
12.5000 mg | Freq: Four times a day (QID) | INTRAMUSCULAR | Status: DC | PRN
  Filled 2011-12-21: qty 0.25

## 2011-12-21 MED ORDER — ALBUTEROL SULFATE HFA 108 (90 BASE) MCG/ACT IN AERS
4.0000 | INHALATION_SPRAY | Freq: Four times a day (QID) | RESPIRATORY_TRACT | Status: AC
  Administered 2011-12-21 – 2011-12-23 (×8): 4 via RESPIRATORY_TRACT
  Filled 2011-12-21: qty 6.7

## 2011-12-21 MED ORDER — FENTANYL 10 MCG/ML IV SOLN
INTRAVENOUS | Status: DC
  Administered 2011-12-21: 30 ug via INTRAVENOUS
  Administered 2011-12-21: 15 ug via INTRAVENOUS
  Administered 2011-12-21: 75 ug via INTRAVENOUS
  Administered 2011-12-22: 135 ug via INTRAVENOUS
  Administered 2011-12-22: 30 ug via INTRAVENOUS
  Administered 2011-12-22: 90 ug via INTRAVENOUS
  Administered 2011-12-22: 135 ug via INTRAVENOUS
  Administered 2011-12-22: 75 ug via INTRAVENOUS
  Administered 2011-12-22: 40 ug via INTRAVENOUS
  Administered 2011-12-23: 30 ug via INTRAVENOUS
  Administered 2011-12-23: 171.9 ug via INTRAVENOUS
  Administered 2011-12-23: 135 ug via INTRAVENOUS
  Administered 2011-12-23 (×2): 30 ug via INTRAVENOUS
  Administered 2011-12-23: 258.2 ug via INTRAVENOUS
  Administered 2011-12-24: 60 ug via INTRAVENOUS
  Administered 2011-12-24: 45 ug via INTRAVENOUS
  Administered 2011-12-24: 95 ug via INTRAVENOUS
  Administered 2011-12-24: 120.1 ug via INTRAVENOUS
  Administered 2011-12-24: 103 ug via INTRAVENOUS
  Filled 2011-12-21 (×5): qty 50

## 2011-12-21 MED ORDER — DEXTROSE 5 % IV SOLN
1.5000 g | Freq: Two times a day (BID) | INTRAVENOUS | Status: AC
  Administered 2011-12-21 – 2011-12-22 (×2): 1.5 g via INTRAVENOUS
  Filled 2011-12-21 (×2): qty 1.5

## 2011-12-21 MED ORDER — BISACODYL 5 MG PO TBEC
10.0000 mg | DELAYED_RELEASE_TABLET | Freq: Every day | ORAL | Status: DC
  Administered 2011-12-22 – 2011-12-25 (×4): 10 mg via ORAL
  Filled 2011-12-21: qty 1
  Filled 2011-12-21 (×3): qty 2

## 2011-12-21 MED ORDER — 0.9 % SODIUM CHLORIDE (POUR BTL) OPTIME
TOPICAL | Status: DC | PRN
  Administered 2011-12-21 (×3): 1000 mL

## 2011-12-21 MED ORDER — FENTANYL CITRATE 0.05 MG/ML IJ SOLN
INTRAMUSCULAR | Status: DC | PRN
  Administered 2011-12-21: 50 ug via INTRAVENOUS
  Administered 2011-12-21 (×2): 100 ug via INTRAVENOUS
  Administered 2011-12-21: 50 ug via INTRAVENOUS
  Administered 2011-12-21: 100 ug via INTRAVENOUS
  Administered 2011-12-21: 50 ug via INTRAVENOUS
  Administered 2011-12-21: 100 ug via INTRAVENOUS
  Administered 2011-12-21 (×4): 50 ug via INTRAVENOUS

## 2011-12-21 MED ORDER — ACETAMINOPHEN 10 MG/ML IV SOLN
1000.0000 mg | Freq: Four times a day (QID) | INTRAVENOUS | Status: AC
  Administered 2011-12-21 – 2011-12-22 (×4): 1000 mg via INTRAVENOUS
  Filled 2011-12-21 (×4): qty 100

## 2011-12-21 MED ORDER — LABETALOL HCL 5 MG/ML IV SOLN
INTRAVENOUS | Status: DC | PRN
  Administered 2011-12-21: 1 mg via INTRAVENOUS
  Administered 2011-12-21 (×2): 2 mg via INTRAVENOUS
  Administered 2011-12-21: 1 mg via INTRAVENOUS
  Administered 2011-12-21: 3 mg via INTRAVENOUS
  Administered 2011-12-21: 1 mg via INTRAVENOUS

## 2011-12-21 MED ORDER — SODIUM CHLORIDE 0.9 % IV SOLN: Freq: Once | INTRAVENOUS | Status: DC

## 2011-12-21 MED ORDER — KCL IN DEXTROSE-NACL 20-5-0.45 MEQ/L-%-% IV SOLN
INTRAVENOUS | Status: AC
  Filled 2011-12-21: qty 1000

## 2011-12-21 MED ORDER — KCL IN DEXTROSE-NACL 20-5-0.45 MEQ/L-%-% IV SOLN
INTRAVENOUS | Status: DC
  Administered 2011-12-21: 14:00:00 via INTRAVENOUS
  Filled 2011-12-21 (×6): qty 1000

## 2011-12-21 MED ORDER — BUPIVACAINE HCL (PF) 0.5 % IJ SOLN
INTRAMUSCULAR | Status: DC | PRN
  Administered 2011-12-21: 5 mL

## 2011-12-21 MED ORDER — LIDOCAINE HCL 2 % EX GEL: Freq: Once | CUTANEOUS | Status: DC

## 2011-12-21 MED ORDER — BUTAMBEN-TETRACAINE-BENZOCAINE 2-2-14 % EX AERO: 1.0000 | INHALATION_SPRAY | Freq: Once | CUTANEOUS | Status: DC

## 2011-12-21 MED ORDER — FOLIC ACID 1 MG PO TABS
1.0000 mg | ORAL_TABLET | Freq: Every day | ORAL | Status: DC
  Administered 2011-12-22 – 2011-12-26 (×5): 1 mg via ORAL
  Filled 2011-12-21 (×6): qty 1

## 2011-12-21 MED ORDER — ONDANSETRON HCL 4 MG/2ML IJ SOLN
4.0000 mg | Freq: Four times a day (QID) | INTRAMUSCULAR | Status: DC | PRN
  Filled 2011-12-21: qty 2

## 2011-12-21 MED ORDER — ROCURONIUM BROMIDE 100 MG/10ML IV SOLN
INTRAVENOUS | Status: DC | PRN
  Administered 2011-12-21 (×2): 25 mg via INTRAVENOUS

## 2011-12-21 MED ORDER — BISACODYL 5 MG PO TBEC
5.0000 mg | DELAYED_RELEASE_TABLET | Freq: Every day | ORAL | Status: DC | PRN
Start: 2011-12-21 — End: 2011-12-26

## 2011-12-21 MED ORDER — SODIUM CHLORIDE 0.9 % IJ SOLN: 9.0000 mL | INTRAMUSCULAR | Status: DC | PRN

## 2011-12-21 MED ORDER — TRAMADOL HCL 50 MG PO TABS
50.0000 mg | ORAL_TABLET | Freq: Four times a day (QID) | ORAL | Status: DC | PRN
  Administered 2011-12-24 – 2011-12-25 (×3): 50 mg via ORAL
  Administered 2011-12-25 (×2): 100 mg via ORAL
  Filled 2011-12-21: qty 1
  Filled 2011-12-21 (×2): qty 2
  Filled 2011-12-21 (×2): qty 1

## 2011-12-21 MED ORDER — HEMOSTATIC AGENTS (NO CHARGE) OPTIME
TOPICAL | Status: DC | PRN
  Administered 2011-12-21: 1 via TOPICAL

## 2011-12-21 MED ORDER — LACTATED RINGERS IV SOLN
INTRAVENOUS | Status: DC | PRN
  Administered 2011-12-21 (×3): via INTRAVENOUS

## 2011-12-21 MED ORDER — ONDANSETRON HCL 4 MG/2ML IJ SOLN
INTRAMUSCULAR | Status: DC | PRN
  Administered 2011-12-21: 4 mg via INTRAVENOUS

## 2011-12-21 MED ORDER — ESMOLOL HCL 10 MG/ML IV SOLN
INTRAVENOUS | Status: DC | PRN
  Administered 2011-12-21: 20 mg via INTRAVENOUS

## 2011-12-21 MED ORDER — PHENYLEPHRINE HCL 0.25 % NA SOLN
1.0000 | NASAL | Status: DC
  Filled 2011-12-21: qty 15

## 2011-12-21 MED ORDER — POTASSIUM CHLORIDE 10 MEQ/50ML IV SOLN
10.0000 meq | Freq: Every day | INTRAVENOUS | Status: DC | PRN
  Administered 2011-12-22 (×3): 10 meq via INTRAVENOUS
  Filled 2011-12-21: qty 150
  Filled 2011-12-21: qty 50

## 2011-12-21 MED ORDER — NALOXONE HCL 0.4 MG/ML IJ SOLN
0.4000 mg | INTRAMUSCULAR | Status: DC | PRN
  Filled 2011-12-21: qty 1

## 2011-12-21 MED ORDER — VECURONIUM BROMIDE 10 MG IV SOLR
INTRAVENOUS | Status: DC | PRN
  Administered 2011-12-21: 5 mg via INTRAVENOUS

## 2011-12-21 MED ORDER — PROPOFOL 10 MG/ML IV EMUL
INTRAVENOUS | Status: DC | PRN
  Administered 2011-12-21: 150 mg via INTRAVENOUS
  Administered 2011-12-21: 50 mg via INTRAVENOUS

## 2011-12-21 MED ORDER — HYDROMORPHONE HCL PF 1 MG/ML IJ SOLN
0.2500 mg | INTRAMUSCULAR | Status: DC | PRN
  Administered 2011-12-21 (×2): 0.5 mg via INTRAVENOUS

## 2011-12-21 MED ORDER — MIDAZOLAM HCL 5 MG/5ML IJ SOLN
INTRAMUSCULAR | Status: DC | PRN
  Administered 2011-12-21: 2 mg via INTRAVENOUS

## 2011-12-21 SURGICAL SUPPLY — 81 items
BRUSH CYTOL CELLEBRITY 1.5X140 (MISCELLANEOUS) IMPLANT
CANISTER SUCTION 2500CC (MISCELLANEOUS) ×3 IMPLANT
CATH KIT ON Q 5IN SLV (PAIN MANAGEMENT) IMPLANT
CATH THORACIC 28FR (CATHETERS) ×3 IMPLANT
CATH THORACIC 36FR (CATHETERS) IMPLANT
CATH THORACIC 36FR RT ANG (CATHETERS) ×3 IMPLANT
CLIP TI MEDIUM 24 (CLIP) ×3 IMPLANT
CLOTH BEACON ORANGE TIMEOUT ST (SAFETY) ×3 IMPLANT
CONT SPEC 4OZ CLIKSEAL STRL BL (MISCELLANEOUS) ×9 IMPLANT
COTTONBALL LRG STERILE PKG (GAUZE/BANDAGES/DRESSINGS) IMPLANT
COVER SURGICAL LIGHT HANDLE (MISCELLANEOUS) ×6 IMPLANT
COVER TABLE BACK 60X90 (DRAPES) ×3 IMPLANT
DERMABOND ADVANCED (GAUZE/BANDAGES/DRESSINGS) ×1
DERMABOND ADVANCED .7 DNX12 (GAUZE/BANDAGES/DRESSINGS) ×2 IMPLANT
DRAPE CAMERA CLOSED 9X96 (DRAPES) ×3 IMPLANT
DRAPE CAMERA VIDEO/LASER (DRAPES) IMPLANT
DRAPE LAPAROSCOPIC ABDOMINAL (DRAPES) ×3 IMPLANT
DRAPE WARM FLUID 44X44 (DRAPE) ×3 IMPLANT
ELECT REM PT RETURN 9FT ADLT (ELECTROSURGICAL) ×3
ELECTRODE REM PT RTRN 9FT ADLT (ELECTROSURGICAL) ×2 IMPLANT
FORCEPS BIOP RJ4 1.8 (CUTTING FORCEPS) IMPLANT
GLOVE BIOGEL PI IND STRL 7.0 (GLOVE) ×2 IMPLANT
GLOVE BIOGEL PI IND STRL 7.5 (GLOVE) ×6 IMPLANT
GLOVE BIOGEL PI INDICATOR 7.0 (GLOVE) ×1
GLOVE BIOGEL PI INDICATOR 7.5 (GLOVE) ×3
GLOVE EUDERMIC 7 POWDERFREE (GLOVE) ×6 IMPLANT
GLOVE SURG SIGNA 7.5 PF LTX (GLOVE) ×3 IMPLANT
GLOVE SURG SS PI 7.5 STRL IVOR (GLOVE) ×6 IMPLANT
GOWN PREVENTION PLUS XLARGE (GOWN DISPOSABLE) ×12 IMPLANT
GOWN STRL NON-REIN LRG LVL3 (GOWN DISPOSABLE) IMPLANT
KIT BASIN OR (CUSTOM PROCEDURE TRAY) ×3 IMPLANT
KIT ROOM TURNOVER OR (KITS) ×3 IMPLANT
MARKER SKIN DUAL TIP RULER LAB (MISCELLANEOUS) ×3 IMPLANT
NEEDLE 22X1 1/2 (OR ONLY) (NEEDLE) ×3 IMPLANT
NEEDLE BIOPSY TRANSBRONCH 21G (NEEDLE) IMPLANT
NS IRRIG 1000ML POUR BTL (IV SOLUTION) ×6 IMPLANT
OIL SILICONE PENTAX (PARTS (SERVICE/REPAIRS)) IMPLANT
PACK CHEST (CUSTOM PROCEDURE TRAY) ×3 IMPLANT
PAD ARMBOARD 7.5X6 YLW CONV (MISCELLANEOUS) ×6 IMPLANT
RELOAD EGIA 60 MED/THCK PURPLE (STAPLE) ×6 IMPLANT
RELOAD EGIA TRIS TAN 45 CVD (STAPLE) ×6 IMPLANT
SEALANT PROGEL (MISCELLANEOUS) ×3 IMPLANT
SEALANT SURG COSEAL 4ML (VASCULAR PRODUCTS) IMPLANT
SEALANT SURG COSEAL 8ML (VASCULAR PRODUCTS) IMPLANT
SOLUTION ANTI FOG 6CC (MISCELLANEOUS) ×3 IMPLANT
SPECIMEN JAR MEDIUM (MISCELLANEOUS) ×3 IMPLANT
SPONGE GAUZE 4X4 12PLY (GAUZE/BANDAGES/DRESSINGS) IMPLANT
SPONGE INTESTINAL PEANUT (DISPOSABLE) ×3 IMPLANT
STAPLER TA30 4.8 NON-ABS (STAPLE) ×3 IMPLANT
SUT PROLENE 3 0 SH DA (SUTURE) IMPLANT
SUT PROLENE 4 0 RB 1 (SUTURE) ×1
SUT PROLENE 4-0 RB1 .5 CRCL 36 (SUTURE) ×2 IMPLANT
SUT SILK  1 MH (SUTURE) ×2
SUT SILK 1 MH (SUTURE) ×4 IMPLANT
SUT SILK 2 0SH CR/8 30 (SUTURE) ×3 IMPLANT
SUT VIC AB 1 CTX 36 (SUTURE) ×1
SUT VIC AB 1 CTX36XBRD ANBCTR (SUTURE) ×2 IMPLANT
SUT VIC AB 2 TP1 27 (SUTURE) ×12 IMPLANT
SUT VIC AB 2-0 CT1 27 (SUTURE)
SUT VIC AB 2-0 CT1 TAPERPNT 27 (SUTURE) IMPLANT
SUT VIC AB 2-0 CTX 36 (SUTURE) IMPLANT
SUT VIC AB 3-0 SH 27 (SUTURE) ×1
SUT VIC AB 3-0 SH 27X BRD (SUTURE) ×2 IMPLANT
SUT VIC AB 3-0 X1 27 (SUTURE) ×3 IMPLANT
SYR 20ML ECCENTRIC (SYRINGE) ×3 IMPLANT
SYR 5ML LUER SLIP (SYRINGE) IMPLANT
SYR CONTROL 10ML LL (SYRINGE) ×3 IMPLANT
SYSTEM SAHARA CHEST DRAIN ATS (WOUND CARE) ×3 IMPLANT
TAPE CLOTH SURG 4X10 WHT LF (GAUZE/BANDAGES/DRESSINGS) ×3 IMPLANT
TAPE UMBILICAL COTTON 1/8X30 (MISCELLANEOUS) ×3 IMPLANT
TIP APPLICATOR SPRAY EXTEND 16 (VASCULAR PRODUCTS) ×3 IMPLANT
TOWEL OR 17X24 6PK STRL BLUE (TOWEL DISPOSABLE) ×6 IMPLANT
TOWEL OR 17X26 10 PK STRL BLUE (TOWEL DISPOSABLE) ×6 IMPLANT
TRAP SPECIMEN MUCOUS 40CC (MISCELLANEOUS) ×3 IMPLANT
TRAY FOLEY CATH 14FRSI W/METER (CATHETERS) ×3 IMPLANT
TUBE CONNECTING 12X1/4 (SUCTIONS) ×3 IMPLANT
TUNNELER SHEATH ON-Q 11GX8 (MISCELLANEOUS) IMPLANT
VALVE BIOPSY  SINGLE USE (MISCELLANEOUS)
VALVE BIOPSY SINGLE USE (MISCELLANEOUS) IMPLANT
VALVE SUCTION BRONCHIO DISP (MISCELLANEOUS) IMPLANT
WATER STERILE IRR 1000ML POUR (IV SOLUTION) ×3 IMPLANT

## 2011-12-21 NOTE — OR Nursing (Signed)
Bronchoscopy end time: 0826

## 2011-12-21 NOTE — OR Nursing (Signed)
Lobectomy Start time: 0910

## 2011-12-21 NOTE — Anesthesia Procedure Notes (Addendum)
Date/Time: 12/21/2011 8:30 AM Performed by: Rivka Barbara Endobronchial tube: Left and Double lumen EBT and 35 Fr Airway Equipment and Method: Fiberoptic brochoscope Placement Confirmation: ETT inserted through vocal cords under direct vision,  positive ETCO2 and breath sounds checked- equal and bilateral   Procedure Name: Intubation Date/Time: 12/21/2011 8:14 AM Performed by: Delbert Harness Pre-anesthesia Checklist: Patient identified, Timeout performed, Emergency Drugs available, Suction available and Patient being monitored Patient Re-evaluated:Patient Re-evaluated prior to inductionOxygen Delivery Method: Circle system utilized Preoxygenation: Pre-oxygenation with 100% oxygen Intubation Type: IV induction Ventilation: Mask ventilation without difficulty Laryngoscope Size: Mac and 3 Grade View: Grade I Tube type: Oral Tube size: 8.5 mm Number of attempts: 1 Airway Equipment and Method: Stylet and LTA kit utilized Placement Confirmation: ETT inserted through vocal cords under direct vision,  positive ETCO2 and breath sounds checked- equal and bilateral Secured at: 21 cm Tube secured with: Tape Dental Injury: Teeth and Oropharynx as per pre-operative assessment

## 2011-12-21 NOTE — Anesthesia Postprocedure Evaluation (Signed)
Anesthesia Post Note  Patient: Regina Grimes  Procedure(s) Performed: Procedure(s) (LRB): THORACOTOMY/LOBECTOMY (Left) VIDEO BRONCHOSCOPY (N/A)  Anesthesia type: general  Patient location: PACU  Post pain: Pain level controlled  Post assessment: Patient's Cardiovascular Status Stable  Last Vitals:  Filed Vitals:   12/21/11 1445  BP: 125/79  Pulse:   Temp:   Resp:     Post vital signs: Reviewed and stable  Level of consciousness: sedated  Complications: No apparent anesthesia complications

## 2011-12-21 NOTE — Anesthesia Preprocedure Evaluation (Addendum)
Anesthesia Evaluation  Patient identified by MRN, date of birth, ID band Patient awake    Reviewed: Allergy & Precautions, H&P , NPO status , Patient's Chart, lab work & pertinent test results  Airway Mallampati: I TM Distance: >3 FB Neck ROM: Full    Dental  (+) Edentulous Upper and Edentulous Lower   Pulmonary  Lung CA- Left lower lobe adenocarcinoma  Hemoptysis breath sounds clear to auscultation        Cardiovascular hypertension,     Neuro/Psych negative neurological ROS     GI/Hepatic Neg liver ROS, hiatal hernia, GERD-  Medicated and Controlled,  Endo/Other  negative endocrine ROS  Renal/GU negative Renal ROS  negative genitourinary   Musculoskeletal  (+) Arthritis -, Rheumatoid disorders,    Abdominal Normal abdominal exam  (+)   Peds  Hematology negative hematology ROS (+)   Anesthesia Other Findings   Reproductive/Obstetrics negative OB ROS                        Anesthesia Physical Anesthesia Plan  ASA: II  Anesthesia Plan: General   Post-op Pain Management:    Induction: Intravenous  Airway Management Planned: Double Lumen EBT  Additional Equipment: Arterial line  Intra-op Plan:   Post-operative Plan: Extubation in OR and Possible Post-op intubation/ventilation  Informed Consent: I have reviewed the patients History and Physical, chart, labs and discussed the procedure including the risks, benefits and alternatives for the proposed anesthesia with the patient or authorized representative who has indicated his/her understanding and acceptance.   Dental advisory given  Plan Discussed with: Anesthesiologist and Surgeon  Anesthesia Plan Comments:         Anesthesia Quick Evaluation

## 2011-12-21 NOTE — H&P (Signed)
[address]            [name] [address]          [phone]      PCP is Tillman Abide, MD, MD  Referring Provider is Oretha Milch, MD  Chief Complaint   Patient presents with   .  Lung Mass     Referral from Dr Vassie Loll for eval on Lung Mass, PET Scan 12/01/2011   HPI:  61 year old ex-smoker, presents for evaluation of hemoptysis and abnormal imaging.  She is on weekly methotrexate for psoriasis and has not taken this in 3 weeks. She presented to [facility] on 11/05/2011 with hemoptysis for 3-4 days, low-grade fever and dyspnea. Chest x-ray showed a cavitary mass in the left lower lobe measuring 5 cm. CT chest with contrast showed a thickwalled cavitary mass medially in the left lower lobe measuring 5.3x3.9x5.6 cm with an enlarged left hilar lymph node. There were a few borderline enlarged axillary and porta hepatis lymph nodes. No other parenchymal abnormality was noted. Her white cell count was 9.6, she reported a 20 pound weight loss but no night sweats. Quantiferon gold was negative per patient. Sputum AFB was negative as were blood cultures. She was treated with Zosyn, Levaquin and clindamycin. She was seen by Dr. Meredeth Ide, CT-guided biopsy was scheduled since patient requested to go home and she was discharged on Augmentin 875 twice a day. Repeat CT chest was performed and showed that the left lower lobe mass had decreased slightly in size and cavitation had increased, hence biopsy was deferred and it was decided to follow this lesion to resolution.  Meanwhile she continued to have hemoptysis about 1/4 cup per day. Low-grade fevers had persisted on Augmentin. She was therefore referred to pulmonary medicine. Bronchoscopy was performed which showed an endobronchial lesion within the subsegmental bronchus of the left lower lobe causing obstruction there. This was very friable and with brushing there was brisk bleeding. Hemostasis was  achieved and no further biopsies or brushings were taken. 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  Past Medical History   Diagnosis  Date   .  Psoriasis      has used methotrexate since she was 61y.o., when psoriasis flares. Currently methotrexate on hold since 11/05/2011   .  Hyperlipidemia    .  Osteopenia    .  GERD (gastroesophageal reflux disease)    .  Arthritis      arm   .  Hiatal hernia    .  Hypertension      treated for while in hosp. 10/2011, given med. , unsure of name of med., not told to continue   .  Coughing up blood      10/2011, admitted to [facility]   .  Recurrent upper respiratory infection (URI)      lung infection, treated /w antibiotic - 10/2011   .  Cancer      lung cancer    Past Surgical History   Procedure  Date   .  Colonoscopy  8/02   .  Esophagogastroduodenoscopy  8/02   .  Tubal ligation  1982   .  Benign tumors of left foot  1992/1994   .  Vd      x 2   .  Abnormal pap      low grade intrepithelial lesion. Colposcipy  okay   .  Video bronchoscopy  11/16/2011     Procedure: VIDEO BRONCHOSCOPY WITH FLUORO; Surgeon: Comer Locket. Vassie Loll, MD; Location: [facility]; Service: Cardiopulmonary; Laterality: Bilateral;   .  Foot neuroma surgery      L foot- 1990's   .  Appendectomy      1982- along /w tubal ligation    Family History   Problem  Relation  Age of Onset   .  Coronary artery disease  Mother    .  Lung cancer  Father    .  Breast cancer  Paternal Aunt    .  Cirrhosis  Maternal Grandmother    .  Anesthesia problems  Neg Hx    Social History  History   Substance Use Topics   .  Smoking status:  Former Smoker -- 1.0 packs/day for 25 years     Types:  Cigarettes     Quit date:  06/25/2010   .  Smokeless tobacco:  Never Used   .  Alcohol Use:  No    Current Outpatient Prescriptions   Medication  Sig  Dispense  Refill   .  ergocalciferol (VITAMIN D2) 50000 UNITS capsule  Take 50,000 Units by mouth every 30 (thirty)  days.     .  folic acid (FOLVITE) 1 MG tablet  Take 1 mg by mouth daily.     Marland Kitchen  acetaminophen (TYLENOL) 325 MG tablet  Take 325 mg by mouth every 6 (six) hours as needed. For pain     .  bisacodyl (DULCOLAX) 5 MG EC tablet  Take 5 mg by mouth daily as needed.     .  calcium carbonate (TUMS EX) 750 MG chewable tablet  Chew 1 tablet by mouth daily as needed.     .  fluocinonide ointment (LIDEX) 0.05 %  Apply 1 application topically 2 (two) times daily as needed. For psoriasis     .  methotrexate (RHEUMATREX) 15 MG tablet  Take 15 mg by mouth once a week. Caution: Chemotherapy. Protect from light., takes for psoriasis  STOPPED- Feb. 2013, due to lung ca. , ? restarting     .  Probiotic Product (PROBIOTIC PO)  Take 1 capsule by mouth daily.      Allergies   Allergen  Reactions   .  Aspirin      REACTION: stomach burning   .  Codeine      Upset stomach   .  Pravastatin Sodium      REACTION: mood changes and snapping at everyone   Review of Systems:  Gen.: She denies any fever or chills. Appetite has been decreased and she has had significant weight loss. She denies fatigue.  Eyes: Negative  ENT: Negative  Endocrine: Denies diabetes and hypothyroidism.  Cardiovascular: She denies any chest pain or pressure. She denies exertional dyspnea, orthopnea, and PND. She's had no peripheral edema or palpitations.  Respiratory: She does report cough and hemoptysis of about one quarter cup at a time. She has not had hemoptysis over the past week.  GI: she denies nausea and vomiting. She has had no dysphagia. She denies melena and bright red blood per rectum. She does have a history of hiatal hernia and reflux.  GU: She denies dysuria and hematuria.  Neurological: Denies any headaches. She denies any focal weakness or numbness. She denies dizziness and syncope. She's never had a TIA or stroke.  Musculoskeletal: She denies arthralgias and myalgias.  Hematological: She denies any history  of bleeding  disorders or easy bleeding.  Vascular: She denies any history of DVT or phlebitis.  BP 165/100  Pulse 102  Resp 20  Ht 5\' 5"  (1.651 m)  Wt 130 lb (58.968 kg)  BMI 21.63 kg/m2  SpO2 97%  Physical Exam:  She is a well-developed white female in no distress.  HEENT: Normocephalic and atraumatic. Pupils are equal and reactive to light and accommodation. Extraocular muscle are intact. Oropharynx is clear.  Neck: Carotid pulses are palpable bilaterally. No bruits. There is no cervical or supraclavicular adenopathy.  Lungs: Clear  Heart: Regular rate and rhythm with normal S1 and S2. There is no murmur, rub, or gallop.  Abdomen: Bowel sounds are present. Soft and nontender. No palpable masses or organomegaly.  Extremities: Pink and warm. Pedal pulses are palpable bilaterally.  Neurological: Alert and oriented x3. Motor and sensory exam is grossly normal.  Skin: She has psoriasis.  Diagnostic Tests:  NUCLEAR MEDICINE PET CT INITIAL (PI) SKULL BASE TO THIGH  Technique: 17.5 mCi F-18 FDG was injected intravenously via the  right antecubital. Full-ring PET imaging was performed from the  skull base through the mid-thighs 70 minutes after injection. CT  data was obtained and used for attenuation correction and anatomic  localization only. (This was not acquired as a diagnostic CT  examination.)  Fasting Blood Glucose: 96  Patient Weight: 130 pounds.  Comparison: Chest x-ray 11/15/2011.  Findings: The large cavitary lesion in the left lower lobe  demonstrates neoplastic range FDG uptake with SUV max of 68.  There are small scattered mediastinal and hilar lymph nodes  but no abnormal FDG uptake is identified. No metastatic  pulmonary nodules. No findings for osseous metastasis. The  abdomen and pelvis demonstrate no findings for metastatic  disease.  IMPRESSION:  1. Markedly FDG avid cavitary left lower lobe lung mass.  2. No mediastinal or hilar lymphadenopathy and no findings for    metastatic disease involving the lungs, bones, abdomen or pelvis.  Original Report Authenticated By: P. Loralie Champagne, M.D.  Patient Name: Regina Grimes, Regina Grimes Accession #: [id number]  DOB: 09-15-1951 Age: 51 Gender: F Client: [facility]  Collected Date: 11/16/2011 Rec'd Date: 11/16/2011 Physician: Cyril Mourning, MD  Chart #: MRN #: [id number] Physician cc:  LMP: Visit #: [id number]  CYTOPATHOLOGY REPORT  Adequacy Reason  Satisfactory For Evaluation.  Diagnosis  BRONCHIAL BRUSHING, LEFT LOWER LOBE:  MALIGNANT CELLS CONSISTENT WITH ADENOCARCINOMA.  Jimmy Picket MD  Pathologist, Electronic Signature  (Case signed 11/17/2011)  Impression:  She has a large cavitary mass in the left lower lobe that appears to be an adenocarcinoma. There is no evidence of metastatic disease by PET scan. She has had significant hemoptysis and I recommended that we proceed with bronchoscopy followed by left thoracotomy and left lower lobectomy. Her pulmonary function is adequate to tolerate lobectomy. I discussed the operative procedure with the patient and her husband including alternatives, benefits, and risks including but not limited to bleeding, blood transfusion, infection, bronchial stump complications, prolonged air leak, respiratory failure, the possibility of incomplete resection, and she understands and agrees to proceed. All of her questions have been answered.  Plan:  We'll schedule surgery for Thursday, 12/21/2011.

## 2011-12-21 NOTE — Brief Op Note (Signed)
12/21/2011  11:53 AM  PATIENT:  Regina Grimes  61 y.o. female  PRE-OPERATIVE DIAGNOSIS:  Left Lower Lobe Lung Cancer  POST-OPERATIVE DIAGNOSIS: Non-small cell carcinoma, LLL  PROCEDURE:  Procedure(s): LEFT THORACOTOMY, LEFT LOWER LOBECTOMY, NODE DISSECTION VIDEO BRONCHOSCOPY  SURGEON:  Surgeon(s): Alleen Borne, MD  ASSISTANT: Coral Ceo, PA-C  ANESTHESIA:   general  SPECIMEN:  Source of Specimen:  Left lower lobe, lymph nodes  DISPOSITION OF SPECIMEN:  Pathology  DRAINS: 28 Fr CT, 36 Fr CT  PATIENT CONDITION:  PACU - hemodynamically stable.

## 2011-12-21 NOTE — Preoperative (Signed)
Beta Blockers   Reason not to administer Beta Blockers:Not Applicable 

## 2011-12-21 NOTE — Interval H&P Note (Signed)
History and Physical Interval Note:  12/21/2011 8:02 AM  Regina Grimes  has presented today for surgery, with the diagnosis of Left Lower Lobe Lung Cancer  The various methods of treatment have been discussed with the patient and family. After consideration of risks, benefits and other options for treatment, the patient has consented to  Procedure(s) (LRB): THORACOTOMY/LOBECTOMY (Left) VIDEO BRONCHOSCOPY (N/A) as a surgical intervention .  The patients' history has been reviewed, patient examined, no change in status, stable for surgery.  I have reviewed the patients' chart and labs.  Questions were answered to the patient's satisfaction.     Alleen Borne

## 2011-12-21 NOTE — Transfer of Care (Signed)
Immediate Anesthesia Transfer of Care Note  Patient: Regina Grimes  Procedure(s) Performed: Procedure(s) (LRB): THORACOTOMY/LOBECTOMY (Left) VIDEO BRONCHOSCOPY (N/A)  Patient Location: PACU  Anesthesia Type: General  Level of Consciousness: awake, alert  and oriented  Airway & Oxygen Therapy: Patient Spontanous Breathing and Patient connected to nasal cannula oxygen  Post-op Assessment: Report given to PACU RN and Post -op Vital signs reviewed and stable  Post vital signs: Reviewed and stable  Complications: No apparent anesthesia complications

## 2011-12-22 ENCOUNTER — Inpatient Hospital Stay (HOSPITAL_COMMUNITY): Payer: 59

## 2011-12-22 LAB — BASIC METABOLIC PANEL
CO2: 27 mEq/L (ref 19–32)
Chloride: 103 mEq/L (ref 96–112)
Creatinine, Ser: 0.52 mg/dL (ref 0.50–1.10)
Glucose, Bld: 128 mg/dL — ABNORMAL HIGH (ref 70–99)

## 2011-12-22 LAB — POCT I-STAT 3, ART BLOOD GAS (G3+)
Acid-base deficit: 1 mmol/L (ref 0.0–2.0)
O2 Saturation: 99 %
pCO2 arterial: 38.5 mmHg (ref 35.0–45.0)

## 2011-12-22 LAB — CBC
Hemoglobin: 8 g/dL — ABNORMAL LOW (ref 12.0–15.0)
MCV: 83.6 fL (ref 78.0–100.0)
Platelets: 253 10*3/uL (ref 150–400)
RBC: 2.93 MIL/uL — ABNORMAL LOW (ref 3.87–5.11)
WBC: 10.3 10*3/uL (ref 4.0–10.5)

## 2011-12-22 MED ORDER — POTASSIUM CHLORIDE CRYS ER 20 MEQ PO TBCR
40.0000 meq | EXTENDED_RELEASE_TABLET | Freq: Once | ORAL | Status: AC
  Administered 2011-12-22: 40 meq via ORAL
  Filled 2011-12-22: qty 2

## 2011-12-22 MED ORDER — FUROSEMIDE 10 MG/ML IJ SOLN
40.0000 mg | Freq: Once | INTRAMUSCULAR | Status: AC
  Administered 2011-12-22: 40 mg via INTRAVENOUS
  Filled 2011-12-22: qty 4

## 2011-12-22 NOTE — Progress Notes (Signed)
Patient ID: Regina Grimes, female   DOB: 1950-12-04, 61 y.o.   MRN: [id number]   Filed Vitals:   12/22/11 1600 12/22/11 1700 12/22/11 1800 12/22/11 1900  BP: 121/71 115/69 120/95 137/74  Pulse: 95 87 97 94  Temp:      TempSrc:      Resp: 16 15 19 16   Weight:      SpO2: 97% 97% 94% 97%   Stable day.  Walked  Still has a small air leak  Discussed pathology with Dr. Dierdre Searles.  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  All lymph nodes and margins negative.  Discussed with patient.

## 2011-12-22 NOTE — Progress Notes (Signed)
UR Completed.  Jasie Meleski Jane [phone] 12/22/2011  

## 2011-12-22 NOTE — Op Note (Signed)
Regina Grimes, KOERBER              ACCOUNT NO.:  [id number]  MEDICAL RECORD NO.:  [id number]  LOCATION:  2311                         FACILITY:  [facility]  PHYSICIAN:  Evelene Croon, M.D.     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  OPERATIVE PROCEDURE:  Flexible fiberoptic bronchoscopy, left muscle- sparing thoracotomy with left lower lobectomy and lymph node dissection.  ATTENDING SURGEON:  Evelene Croon, MD  ASSISTANT:  Coral Ceo, PA-C  ANESTHESIA:  General endotracheal.  CLINICAL HISTORY:  This patient is a 61 year old ex-smoker who presented with hemoptysis and was noted to have a large cavitary left lower lobe lung mass.  CT scan of the chest showed a thick-walled cavitary mass medially in the left lower lobe measuring about 5.3 x 3.9 x 5.6 cm with enlarged hilar lymph nodes.  There were few borderline enlarged axillary and porta hepatis lymph nodes.  No other parenchymal abnormality was noted.  She was treated with antibiotics and sputum AFB was negative as were blood cultures.  She was scheduled for CT-guided biopsy, which was not performed, and she was discharged on oral antibiotics.  Repeat CT scan was performed and showed that the left lower lobe lung mass had decreased slightly in size, but the cavitation had increased. Therefore, a biopsy was deferred and it was decided to follow this lesion to resolution.  Meanwhile, she continued to have hemoptysis about 1/4 cup per day and low-grade fevers, on Augmentin.  She was seen by Pulmonary Medicine and bronchoscopy showed an endobronchial lesion within the subsegmental bronchus of the left lower lobe causing obstruction there.  This was  very friable and bled profusely with brushing.  The brushing showed malignant cells consistent with possible adenocarcinoma.  She was referred to me.  She did undergo a PET scan that showed marked hypermetabolic activity within the left lower lung mass with a SUV of 68.  There were no mediastinal or hilar lymph nodes that were hypermetabolic and no sign of metastatic disease.  After review of these findings, I felt the best option was to proceed with left thoracotomy and lower lobectomy especially considering her episodes of hemoptysis that were bordering on massive.  I discussed the operative procedure with the patient and her husband including alternatives, benefits, and risks including, but not limited to bleeding, blood transfusion, infection, respiratory failure, the possibility of incomplete resection or requiring pneumonectomy to completely resect this lesion, and bronchial stump complications.  She understood all of this and agreed to proceed.  OPERATIVE PROCEDURE:  The patient was seen in the preoperative holding area and the proper patient, proper operative side were confirmed with the patient after reviewing her CT scan and PET scan.  Left side of her chest was signed by me.  Preoperative intravenous antibiotics were given.  She was taken back to the operating room and placed on the table in supine position.  After induction of general endotracheal anesthesia using  a single-lumen tube, a Foley catheter was placed in the bladder using sterile technique and pneumatic compression devices were placed on both lower extremities.  Then, flexible fiberoptic bronchoscopy was performed.  The distal trachea was normal.  The carina was sharp.  The right bronchial tree had normal segmental anatomy, and there were no endobronchial lesions seen.  There was no extrinsic compression.  The left bronchial tree was normal down to the segmental bronchial level. In the basal segmental bronchus,  there was an obstructing lesion noted, which had been seen previously by bronchoscopy per Pulmonary Medicine. This did not become any more proximal than the segmental bronchus.  I felt there was a good length of left lower lobe bronchus proximal to this to allow lobectomy safely with a negative margin.  Then, the bronchoscope was withdrawn from the patient.  The single-lumen tube was then converted to a double-lumen tube by anesthesiology, and the patient was turned into the right lateral decubitus position with the left side up.  Left side of the chest were prepped with Betadine soap and solution and draped in usual sterile manner.  A second time-out was taken, proper patient, proper operation, proper operative side were confirmed with nursing and anesthesia staff after reviewing the CT scan in the operating room.  Then, the left chest was entered through a muscle-sparing lateral thoracotomy incision.  The pleural space was entered through the fifth intercostal space.  Examination of the lung showed the left upper lobe was normal.  The left lower lobe was mostly a large firm mass.  There was a small portion of the lower lobe, but did not appear to be involved.  There was adhesion of the left lower lobe to the posterior chest wall over a broad area as well as adhesion to the diaphragm.  The lung was still mobile throughout the area where it was adhesed through the chest wall, and I did not think that it was actively invading the chest wall.  It was adhesed to the chest wall over such a large area. Exposure of these adhesions was difficulty since it was fairly low in the chest and therefore, I decided to dissect out and divide the vascular and bronchial structures before proceeding ahead with the removal from the chest wall.  The interlobar portion of the pulmonary artery was dissected.  The lingular branches of the pulmonary artery were identified and preserved.  Just below this level,  the lower lobe pulmonary artery was encircled with a vessel loop and then divided using a vascular stapler.  Then, the inferior pulmonary ligament was divided using electrocautery up to the inferior pulmonary vein and the inferior pulmonary vein was encircled with a tape and divided using a vascular stapler.  There were lymph nodes present around the hilum of the lung, which were taken with the specimen.  Then, the left lower lobe bronchus was dissected free and all the lymph nodes were taken off of the bronchus along with the specimen.  The left lower lobe bronchus was encircled with a tape and then encircled with a TA-30 linear stapler with 4.8-mm staples.  The stapler was closed to occlude the lower lobe bronchus and the left lung was reinflated.  The left upper lobe was inflated without difficulty.  The stapler was fired and the bronchus was divided distal to the stapler.  Then, attention was turned to the chest wall adhesions.  In order to see this area adequately, it was necessary to make a second incision in  the seventh intercostal space.  This was done through the same skin incision and there was no division of the chest wall muscles.  Using electrocautery, I was able to divide the adhesions between the left lower lobe and the chest wall.  Again, this was over a broad area posteriorly.  The lower lobe was also firmly adherent to the diaphragm with adhesions and these were divided using electrocautery.  After complete removal of these, the specimen was passed off the table and sent to pathology.  There were a couple of lymph nodes along with 10R location as well as in the aortopulmonary window and these were sent to pathology as separate specimens.  The bronchial stump was then tested under warm saline at 30 cm pressure and there was no air leakage.  Then, ProGel was used to coat the staple line along the left upper lobe.  Hemostasis was completed.  Two chest tubes were placed with  a 36-French right-angle chest tube positioned inferiorly and a 28-French straight tube positioned posteriorly and up to the apex.  An On-Q catheter was brought through a separate stab incision and positioned in a subpleural location from below the thoracotomy level posterior to the incision and two interspaces above. This was flushed with 5 mL of 0.5% Marcaine and connected to a pinball with 0.5% Marcaine.  Then, the ribs were reapproximated at both levels with #2 Vicryl pericostal sutures.  Before tying the sutures, the left lung was reinflated.  These sutures were then tied.  The muscles were returned to their normal anatomic position.  Subcutaneous tissue was closed with continuous 2-0 Vicryl and skin with a 3-0 Vicryl subcuticular closure.  The sponge, needle, and instrument counts were correct according to the scrub nurse.  Dry sterile dressing was applied over the incision and around the chest tubes, which were hooked to Pleur-Evac suction.  The patient was then turned in the supine position, extubated, and transferred to the postanesthesia care unit in satisfactory and stable condition.     Evelene Croon, M.D.     BB/MEDQ  D:  12/21/2011  T:  12/22/2011  Job:  [id number]

## 2011-12-22 NOTE — Progress Notes (Addendum)
1 Day Post-Op Procedure(s) (LRB): THORACOTOMY/LOBECTOMY (Left) VIDEO BRONCHOSCOPY (N/A) Subjective: No complaints.  Pain adequately controlled with PCA   Objective: Vital signs in last 24 hours: Temp:  [97 F (36.1 C)-99 F (37.2 C)] 98.5 F (36.9 C) (03/29 0400) Pulse Rate:  [61-104] 78  (03/29 0700) Cardiac Rhythm:  [-] Normal sinus rhythm (03/28 2000) Resp:  [10-25] 13  (03/29 0700) BP: (111-195)/(63-91) 134/77 mmHg (03/29 0600) SpO2:  [96 %-100 %] 96 % (03/29 0700) Arterial Line BP: (59-165)/(52-110) 132/52 mmHg (03/29 0700) FiO2 (%):  [0 %-2 %] 0 % (03/28 2000) Weight:  [62.959 kg (138 lb 12.8 oz)] 62.959 kg (138 lb 12.8 oz) (03/29 0600)  Small air leak from chest tubes  Hemodynamic parameters for last 24 hours:    Intake/Output from previous day: 03/28 0701 - 03/29 0700 In: 4469 [I.V.:4019; IV Piggyback:450] Out: 3725 [Urine:2615; Blood:700; Chest Tube:410] Intake/Output this shift:    General appearance: alert and cooperative Neurologic: intact Heart: regular rate and rhythm, S1, S2 normal, no murmur, click, rub or gallop Lungs: clear to auscultation bilaterally Extremities: extremities normal, atraumatic, no cyanosis or edema Wound: dressing dry  Lab Results:  [facility] 12/22/11 0415  WBC 10.3  HGB 8.0*  HCT 24.5*  PLT 253   BMET:  Basename 12/22/11 0415  NA 138  K 3.4*  CL 103  CO2 27  GLUCOSE 128*  BUN 7  CREATININE 0.52  CALCIUM 8.0*    PT/INR: No results found for this basename: LABPROT,INR in the last 72 hours ABG    Component Value Date/Time   PHART 7.392 12/22/2011 0419   HCO3 23.4 12/22/2011 0419   TCO2 25 12/22/2011 0419   ACIDBASEDEF 1.0 12/22/2011 0419   O2SAT 99.0 12/22/2011 0419   CBG (last 3)  No results found for this basename: GLUCAP:3 in the last 72 hours   CXR:  Lungs clear. No ptx  Assessment/Plan: S/P Procedure(s) (LRB): THORACOTOMY/LOBECTOMY (Left) VIDEO BRONCHOSCOPY (N/A) Chest tubes to 10cm suction Diurese and  replace K+ IS DC A-line and foley.   LOS: 1 day    Shonteria Abeln K 12/22/2011

## 2011-12-22 NOTE — Progress Notes (Signed)
   CARE MANAGEMENT NOTE 12/22/2011  Patient:  Regina Grimes, Regina Grimes   Account Number:  [id number]  Date Initiated:  12/22/2011  Documentation initiated by:  [facility]  Subjective/Objective Assessment:   Thoracotomy.  Has spouse.     Action/Plan:   PTA, PT INDEPENDENT, LIVES WITH SPOUSE; SHE STATES HUSBAND TO PROVIDE 24HR CARE AT DISCHARGE.   Anticipated DC Date:  12/26/2011   Anticipated DC Plan:  HOME W HOME HEALTH SERVICES      DC Planning Services  CM consult      Choice offered to / List presented to:             Status of service:  In process, will continue to follow Medicare Important Message given?   (If response is "NO", the following Medicare IM given date fields will be blank) Date Medicare IM given:   Date Additional Medicare IM given:    Discharge Disposition:    Per UR Regulation:  Reviewed for med. necessity/level of care/duration of stay  If discussed at Long Length of Stay Meetings, dates discussed:    Comments:  12/22/11 Pollie Poma,RN,BSN WILL FOLLOW FOR HOME NEEDS AS PT PROGRESSES. Phone #[phone]

## 2011-12-22 NOTE — Progress Notes (Signed)
All charting, assessments, patient care, and medication administration given/provided by SN Amy, V-Smith has been supervised and verified to be accurate by myself from 1900 3/28 through 0730 3/29

## 2011-12-22 NOTE — Progress Notes (Signed)
Status post left lower lobectomy earlier today Vital signs stable sinus rhythm minimal chest tube drainage Patient breathing comfortably O2 saturation 95% Doing well postop left lower lobectomy

## 2011-12-22 NOTE — Progress Notes (Addendum)
Wasted of fent PCA in sink with flush. Brenton Grills, Jessica [employer], Donnell Wion [place]

## 2011-12-23 ENCOUNTER — Inpatient Hospital Stay (HOSPITAL_COMMUNITY): Payer: 59

## 2011-12-23 LAB — CBC
MCH: 26.9 pg (ref 26.0–34.0)
MCHC: 31.9 g/dL (ref 30.0–36.0)
Platelets: 265 10*3/uL (ref 150–400)
RBC: 3.01 MIL/uL — ABNORMAL LOW (ref 3.87–5.11)

## 2011-12-23 LAB — COMPREHENSIVE METABOLIC PANEL
ALT: 7 U/L (ref 0–35)
AST: 14 U/L (ref 0–37)
Calcium: 8.3 mg/dL — ABNORMAL LOW (ref 8.4–10.5)
Sodium: 138 mEq/L (ref 135–145)
Total Protein: 6.1 g/dL (ref 6.0–8.3)

## 2011-12-23 MED ORDER — METOPROLOL TARTRATE 25 MG PO TABS
25.0000 mg | ORAL_TABLET | Freq: Two times a day (BID) | ORAL | Status: DC
  Administered 2011-12-23 – 2011-12-26 (×6): 25 mg via ORAL
  Filled 2011-12-23 (×7): qty 1

## 2011-12-23 NOTE — Progress Notes (Signed)
2 Days Post-Op Procedure(s) (LRB): THORACOTOMY/LOBECTOMY (Left) VIDEO BRONCHOSCOPY (N/A) Subjective: No complaints  Objective: Vital signs in last 24 hours: Temp:  [98.1 F (36.7 C)-99.5 F (37.5 C)] 98.2 F (36.8 C) (03/30 1200) Pulse Rate:  [86-117] 86  (03/30 1300) Cardiac Rhythm:  [-] Normal sinus rhythm (03/30 0800) Resp:  [12-24] 13  (03/30 1300) BP: (115-159)/(60-95) 148/77 mmHg (03/30 1300) SpO2:  [94 %-100 %] 100 % (03/30 1300) FiO2 (%):  [0 %-96 %] 0 % (03/30 0400) Weight:  [59.376 kg (130 lb 14.4 oz)] 59.376 kg (130 lb 14.4 oz) (03/30 0500)  Hemodynamic parameters for last 24 hours:    Intake/Output from previous day: 03/29 0701 - 03/30 0700 In: 1310.5 [P.O.:720; I.V.:486.5; IV Piggyback:104] Out: 4155 [Urine:3935; Chest Tube:220] Intake/Output this shift: Total I/O In: 616 [P.O.:450; I.V.:166] Out: 775 [Urine:775]  General appearance: alert and cooperative Neurologic: intact Heart: regular rate and rhythm, S1, S2 normal, no murmur, click, rub or gallop Lungs: clear to auscultation bilaterally Wound: ok Small intermittent air leak from chest tubes. Lab Results:  Basename 12/23/11 0415 12/22/11 0415  WBC 10.6* 10.3  HGB 8.1* 8.0*  HCT 25.4* 24.5*  PLT 265 253   BMET:  Basename 12/23/11 0415 12/22/11 0415  NA 138 138  K 3.8 3.4*  CL 101 103  CO2 29 27  GLUCOSE 110* 128*  BUN 6 7  CREATININE 0.53 0.52  CALCIUM 8.3* 8.0*    PT/INR: No results found for this basename: LABPROT,INR in the last 72 hours ABG    Component Value Date/Time   PHART 7.392 12/22/2011 0419   HCO3 23.4 12/22/2011 0419   TCO2 25 12/22/2011 0419   ACIDBASEDEF 1.0 12/22/2011 0419   O2SAT 99.0 12/22/2011 0419   CBG (last 3)  No results found for this basename: GLUCAP:3 in the last 72 hours   CXR:  Lungs clear, no ptx Assessment/Plan: S/P Procedure(s) (LRB): THORACOTOMY/LOBECTOMY (Left) VIDEO BRONCHOSCOPY (N/A) Chest tubes to water seal Continue IS and ambulation   LOS:  2 days    BARTLE,BRYAN K 12/23/2011

## 2011-12-23 NOTE — Progress Notes (Signed)
All charting, medications, assessments and patient care by SN Amy V-Smith has been supervised by this RN from 1900 3/29 through 0700 3/29 

## 2011-12-24 ENCOUNTER — Inpatient Hospital Stay (HOSPITAL_COMMUNITY): Payer: 59

## 2011-12-24 ENCOUNTER — Encounter (HOSPITAL_COMMUNITY): Payer: Self-pay | Admitting: *Deleted

## 2011-12-24 MED ORDER — SODIUM CHLORIDE 0.9 % IJ SOLN: 10.0000 mL | INTRAMUSCULAR | Status: DC | PRN

## 2011-12-24 MED ORDER — SODIUM CHLORIDE 0.9 % IJ SOLN
10.0000 mL | Freq: Two times a day (BID) | INTRAMUSCULAR | Status: DC
  Administered 2011-12-24: 20 mL
  Administered 2011-12-25: 10 mL
  Administered 2011-12-25: 20 mL
  Filled 2011-12-24: qty 20

## 2011-12-24 NOTE — Progress Notes (Signed)
3 Days Post-Op Procedure(s) (LRB): THORACOTOMY/LOBECTOMY (Left) VIDEO BRONCHOSCOPY (N/A) Subjective: No complaints  Objective: Vital signs in last 24 hours: Temp:  [98.1 F (36.7 C)-99.5 F (37.5 C)] 98.7 F (37.1 C) (03/31 1200) Pulse Rate:  [69-106] 97  (03/31 1200) Cardiac Rhythm:  [-] Normal sinus rhythm (03/31 1200) Resp:  [12-20] 19  (03/31 1200) BP: (123-163)/(65-82) 143/79 mmHg (03/31 1200) SpO2:  [95 %-100 %] 95 % (03/31 1200)  Hemodynamic parameters for last 24 hours:    Intake/Output from previous day: 03/30 0701 - 03/31 0700 In: 1692.5 [P.O.:1150; I.V.:542.5] Out: 3205 [Urine:3110; Chest Tube:95] Intake/Output this shift: Total I/O In: 450 [P.O.:350; I.V.:100] Out: 1000 [Urine:1000]  General appearance: alert and cooperative Heart: regular rate and rhythm, S1, S2 normal, no murmur, click, rub or gallop Lungs: clear to auscultation bilaterally No air leak from chest tube seen  Lab Results:  Basename 12/23/11 0415 12/22/11 0415  WBC 10.6* 10.3  HGB 8.1* 8.0*  HCT 25.4* 24.5*  PLT 265 253   BMET:  Basename 12/23/11 0415 12/22/11 0415  NA 138 138  K 3.8 3.4*  CL 101 103  CO2 29 27  GLUCOSE 110* 128*  BUN 6 7  CREATININE 0.53 0.52  CALCIUM 8.3* 8.0*    PT/INR: No results found for this basename: LABPROT,INR in the last 72 hours ABG    Component Value Date/Time   PHART 7.392 12/22/2011 0419   HCO3 23.4 12/22/2011 0419   TCO2 25 12/22/2011 0419   ACIDBASEDEF 1.0 12/22/2011 0419   O2SAT 99.0 12/22/2011 0419   CBG (last 3)  No results found for this basename: GLUCAP:3 in the last 72 hours  CXR:  Small left apical ptx.  Lungs clear otherwise. Assessment/Plan: S/P Procedure(s) (LRB): THORACOTOMY/LOBECTOMY (Left) VIDEO BRONCHOSCOPY (N/A) Removed inferior chest tube and will keep the superior chest tube to water seal.  Continue mobilization and IS.  She will be ready to go home when the other tube is out.   LOS: 3 days    Regina Grimes  K 12/24/2011

## 2011-12-24 NOTE — Progress Notes (Signed)
NURSING -  FENTANYL PCA D/C'D  &  WASTED IN SINK.         [facility], RN/ [employer]

## 2011-12-25 ENCOUNTER — Inpatient Hospital Stay (HOSPITAL_COMMUNITY): Payer: 59

## 2011-12-25 MED ORDER — FLUOCINONIDE 0.05 % EX OINT
1.0000 "application " | TOPICAL_OINTMENT | Freq: Two times a day (BID) | CUTANEOUS | Status: DC
  Filled 2011-12-25: qty 15

## 2011-12-25 NOTE — Progress Notes (Signed)
4 Days Post-Op Procedure(s) (LRB): THORACOTOMY/LOBECTOMY (Left) VIDEO BRONCHOSCOPY (N/A) Subjective: No complaints this AM  Objective: Vital signs in last 24 hours: Temp:  [97.9 F (36.6 C)-100.1 F (37.8 C)] 97.9 F (36.6 C) (04/01 0730) Pulse Rate:  [58-97] 71  (04/01 0700) Cardiac Rhythm:  [-] Normal sinus rhythm (03/31 2000) Resp:  [11-20] 16  (04/01 0700) BP: (117-162)/(60-114) 138/74 mmHg (04/01 0700) SpO2:  [91 %-100 %] 96 % (04/01 0700)  Hemodynamic parameters for last 24 hours:    Intake/Output from previous day: 03/31 0701 - 04/01 0700 In: 1642.3 [P.O.:1420; I.V.:222.3] Out: 2780 [Urine:2700; Chest Tube:80] Intake/Output this shift:    General appearance: alert and no distress Heart: regular rate and rhythm Lungs: clear to auscultation bilaterally Wound: intact  Lab Results:  Basename 12/23/11 0415  WBC 10.6*  HGB 8.1*  HCT 25.4*  PLT 265   BMET:  Basename 12/23/11 0415  NA 138  K 3.8  CL 101  CO2 29  GLUCOSE 110*  BUN 6  CREATININE 0.53  CALCIUM 8.3*    PT/INR: No results found for this basename: LABPROT,INR in the last 72 hours ABG    Component Value Date/Time   PHART 7.392 12/22/2011 0419   HCO3 23.4 12/22/2011 0419   TCO2 25 12/22/2011 0419   ACIDBASEDEF 1.0 12/22/2011 0419   O2SAT 99.0 12/22/2011 0419   CBG (last 3)  No results found for this basename: GLUCAP:3 in the last 72 hours  Assessment/Plan: S/P Procedure(s) (LRB): THORACOTOMY/LOBECTOMY (Left) VIDEO BRONCHOSCOPY (N/A) Plan for transfer to step-down: see transfer orders No air leak - will d/c CT Continue ambulation Transfer to 2000 Possibly home tomorrow   LOS: 4 days    Daniyla Pfahler C 12/25/2011

## 2011-12-26 ENCOUNTER — Inpatient Hospital Stay (HOSPITAL_COMMUNITY): Payer: 59

## 2011-12-26 ENCOUNTER — Encounter (HOSPITAL_COMMUNITY): Payer: Self-pay | Admitting: Surgery

## 2011-12-26 LAB — BASIC METABOLIC PANEL
BUN: 10 mg/dL (ref 6–23)
Chloride: 98 mEq/L (ref 96–112)
GFR calc Af Amer: >90 mL/min (ref >90)
GFR calc non Af Amer: >90 mL/min (ref >90)
Potassium: 3.4 mEq/L — ABNORMAL LOW (ref 3.5–5.1)
Sodium: 137 mEq/L (ref 135–145)

## 2011-12-26 LAB — CBC
HCT: 28.8 % — ABNORMAL LOW (ref 36.0–46.0)
MCHC: 31.6 g/dL (ref 30.0–36.0)
Platelets: 422 10*3/uL — ABNORMAL HIGH (ref 150–400)
RDW: 13.5 % (ref 11.5–15.5)
WBC: 10 10*3/uL (ref 4.0–10.5)

## 2011-12-26 MED ORDER — METOPROLOL TARTRATE 25 MG PO TABS: 25.0000 mg | ORAL_TABLET | Freq: Two times a day (BID) | ORAL | Status: DC

## 2011-12-26 MED ORDER — OXYCODONE-ACETAMINOPHEN 5-325 MG PO TABS: 1.0000 | ORAL_TABLET | ORAL | Status: AC | PRN

## 2011-12-26 NOTE — Discharge Instructions (Signed)
Lung Cancer  Lung cancer is a tumor which starts as a growth in your lungs. Cancer is a group of many related diseases that begin in cells, the building blocks of the body. Normally, cells grow and divide to produce more cells only when the body needs them. Sometimes cells keep dividing when new cells are not needed. These extra cells may form a mass of tissue called a growth or tumor. Tumors can be either benign (not cancerous) or malignant (cancerous). Cancer can begin in any organ or tissue of the body. The original tumor (where the tumor started out) is called the primary cancer and is usually named for where it begins.   Lung cancer is the most common cause of cancer death in men and women. There are several different types of lung cancers. Usually, lung cancer is described as either small-cell lung cancer or non-small-cell lung cancer. Other types of cancer occur in the lungs, including carcinoid and cancers spread from other organs. The types of cancer have different behavior and treatment.  CAUSES   This cancer usually starts when the lungs are exposed to harmful chemicals. When you quit smoking, your risk of lung cancer falls each year (but is never the same as a person who has never smoked).   Other risks include:    Radon gas exposure.   Asbestos and other industrial substance exposure.   Second hand tobacco smoke.   Air pollution.   Family or personal history of lung cancer.   Age over 65.  SYMPTOMS   Lung cancer can cause many symptoms. They depend on the type of cancer, its location and other factors.  Symptoms of lung cancer can include:   Cough (either new, different or more severe).   Shortness of breath.   Coughing up blood (hemoptysis).   Chest pain.   Hoarseness.   Swelling of the face.   Drooping eyelid.   Changes in blood tests: low sodium (hyponatremia), high calcium (hypercalcemia) or low blood count (anemia).   Weight loss.  In its early stages, lung cancer may not have  symptoms and can be discovered by accident. Many of the symptoms above can be caused by diseases other than lung cancer.  DIAGNOSIS   In early lung cancer, the patient often does not notice problems. It usually has spread by the time problems are first noticed. Your caregiver may suspect lung cancer based on your symptoms, your exam or based on tests (such as x-rays) obtained for other reasons. Common tests that help your caregiver diagnose your condition include:   Chest x-ray.   CT scan of the lungs and chest.   Blood tests.  If a tumor is found, a biopsy will be necessary to confirm that cancer is present and to determine the type of cancer.  TREATMENT    Surgery offers a hope for a cure if the cancer has not spread and the cancer is not a small cell (oat cell) cancer of the lung. Surgery cannot cure the small cell type of cancer.   Radiation Therapy is a form of high energy X-ray that helps slow or kill the cancer. It is often used along with medications (chemotherapy) to help treat the cancer and control pain.   Chemotherapy is used in combination with surgery in advanced cancer. It is also used in all small cell cancers.   Many new treatments look promising.   Your caregiver can give you more information and discuss treatment options that are best for   Take all medications as told.   Keep all appointments with your caregiver and other specialists.   Ask your caregiver if you should see a cancer specialist, if that has not been arranged.   If you require oxygen or breathing equipment, be sure you know how to use it and who to call with questions.   Follow any special diet directions. If you have problems with appetite, ask your caregiver for help.  SEEK MEDICAL CARE IF:   You have had a surgical procedure are you are having trouble recovering.   You have ongoing weight loss.   You have decreased  strength or energy past the point when your caregiver said you would feel better.   You develop nausea or lightheadedness.   You have pain that is not improving.  SEEK IMMEDIATE MEDICAL CARE IF:  You cough up clotted blood or bright red blood. Lung Resection Care After Refer to this sheet in the next few weeks. These instructions provide you with information on caring for yourself after your procedure. Your caregiver may also give you more specific instructions. Your treatment has been planned according to current medical practices, but problems sometimes occur. Call your caregiver if you have any problems or questions after your procedure. HOME CARE INSTRUCTIONS  You may resume a normal diet and activities as directed.   Do not smoke or use tobacco products.   Change your bandages (dressings) as directed.   Only take over-the-counter or prescription medicines for pain, discomfort, or fever as directed by your caregiver.   Keep all follow-up appointments as directed.   Try to breathe deeply and cough as directed. Holding a pillow firmly over your ribs may help with discomfort.   If you were given an incentive spirometer in the hospital, continue to use it as directed.   Walk as directed by your caregiver.   You may take a shower and gently wash the area of your surgical cut (incision) with water and soap as directed. Do not use anything else to clean your incision except as directed by your caregiver. Do not take baths or sit in a hot tub.  SEEK MEDICAL CARE IF:  You notice redness, swelling, or increasing pain in the incision.   You are bleeding from the incision.   You see pus coming from the incision.   You notice a bad smell coming from the incision or dressing.   Your incision breaks open.   You cough up blood or pus, or you develop a cough that produces bad smelling sputum.   You have pain or swelling in your legs.   You have increasing pain that is not controlled  with medicine.   You have trouble managing any of the tubes that have been left in place after surgery.  SEEK IMMEDIATE MEDICAL CARE IF:   You have a fever or chills.   You have any reaction or side effects to medicines given.   You have chest pain or an irregular or rapid heartbeat.   You have dizzy episodes or fainting.   You have shortness of breath or difficulty breathing.   You have persistent nausea or vomiting.   You have a rash.  MAKE SURE YOU:  Understand these instructions.   Will watch your condition.   Will get help right away if you are not doing well or get worse.  Document Released: 03/31/2005 Document Revised: 08/31/2011 Document Reviewed: 05/11/2011  [facility] Patient Information 2012 [place], [place].  Your pain is uncontrolled.  You develop new difficulty breathing or chest pain.   You develop swelling in one or both ankles or legs, or swelling in your face or neck.   You develop new headache or confusion.  Document Released: 12/18/2000 Document Revised: 08/31/2011 Document Reviewed: 09/28/2008 [facility] Patient Information 2012 [place], [place].

## 2011-12-26 NOTE — Progress Notes (Signed)
[address]            [place] [address]          [phone]     5 Days Post-Op  Procedure(s) (LRB): THORACOTOMY/LOBECTOMY (Left) VIDEO BRONCHOSCOPY (N/A) Subjective: Breathing is comfortable  Objective  Telemetry SR  Temp:  [97.9 F (36.6 C)-99.6 F (37.6 C)] 97.9 F (36.6 C) (04/02 9147) Pulse Rate:  [65-79] 79  (04/02 0642) Resp:  [18] 18  (04/02 0642) BP: (129-139)/(80-83) 129/82 mmHg (04/02 0642) SpO2:  [94 %-99 %] 99 % (04/02 0642)   Intake/Output Summary (Last 24 hours) at 12/26/11 1221 Last data filed at 12/26/11 0800  Gross per 24 hour  Intake    480 ml  Output      0 ml  Net    480 ml       General appearance: alert, cooperative and no distress Heart: regular rate and rhythm, S1, S2 normal, no murmur, click, rub or gallop Lungs: slightly diminished in r base Abdomen: benign Wound: incision healing well  Lab Results:  Basename 12/26/11 0637  NA 137  K 3.4*  CL 98  CO2 31  GLUCOSE 106*  BUN 10  CREATININE 0.58  CALCIUM 8.9  MG --  PHOS --   No results found for this basename: AST:2,ALT:2,ALKPHOS:2,BILITOT:2,PROT:2,ALBUMIN:2 in the last 72 hours No results found for this basename: LIPASE:2,AMYLASE:2 in the last 72 hours  Basename 12/26/11 0637  WBC 10.0  NEUTROABS --  HGB 9.1*  HCT 28.8*  MCV 83.7  PLT 422*   No results found for this basename: CKTOTAL:4,CKMB:4,TROPONINI:4 in the last 72 hours No components found with this basename: POCBNP:3 No results found for this basename: DDIMER in the last 72 hours No results found for this basename: HGBA1C in the last 72 hours No results found for this basename: CHOL,HDL,LDLCALC,TRIG,CHOLHDL in the last 72 hours No results found for this basename: TSH,T4TOTAL,FREET3,T3FREE,THYROIDAB in the last 72 hours No results found for this basename: VITAMINB12,FOLATE,FERRITIN,TIBC,IRON,RETICCTPCT in the last 72 hours  Medications: Scheduled    . bisacodyl  10 mg  Oral Daily  . fluocinonide ointment  1 application Topical BID  . folic acid  1 mg Oral Daily  . metoprolol tartrate  25 mg Oral BID  . sodium chloride  10-40 mL Intracatheter Q12H     Radiology/Studies:  Dg Chest 2 View  12/26/2011  *RADIOLOGY REPORT*  Clinical Data: Postop (left lower lobectomy), cough, congestion  CHEST - 2 VIEW  Comparison: 12/25/2011; 12/24/2011; 12/23/2011  Findings:  Grossly unchanged cardiac silhouette and mediastinal contours. Unchanged position of right jugular approach and venous catheter with tip overlying the distal SVC.  Grossly unchanged mild elevation of the left hemidiaphragm with small left-sided effusion and left basilar heterogeneous / consolidative opacities. No new focal airspace opacities.  There is persistent blunting of the right costophrenic angle which may suggest a small right-sided pleural effusion.  Unchanged small hiatal hernia.  There is grossly unchanged slightly asymmetric right apical pleural parenchymal thickening.  Unchanged tiny left apical pneumothorax.  Grossly unchanged bones.  IMPRESSION: 1.  Unchanged tiny left apical pneumothorax. 2.  Grossly unchanged small left-sided effusion and left basilar heterogeneous / consolidative opacities, atelectasis versus infiltrate.  Original Report Authenticated By: Waynard Reeds, M.D.   Dg Chest Port 1 View  12/25/2011  *RADIOLOGY REPORT*  Clinical Data: Chest tube removal.  PORTABLE CHEST -  1 VIEW  Comparison: 12/25/2011 6:12 a.m.  Findings: Left-sided chest tube removed.  Small left apical pneumothorax without change.  Left-sided pleural effusion.  Elevated left hemidiaphragm.  Right central line tip distal superior vena cava level.  Heart size within normal limits.  Mild central pulmonary vascular prominence.  IMPRESSION: Left-sided chest tube removed.  Small left apical pneumothorax without change.  Original Report Authenticated By: Fuller Canada, M.D.   Dg Chest Port 1 View  12/25/2011  *RADIOLOGY  REPORT*  Clinical Data: Left lower lobectomy  PORTABLE CHEST - 1 VIEW  Comparison: 12/24/2011  Findings: Improved left apical pneumothorax which is now less than 5%.  Stable volume loss at the left base.  While the left chest tubes has been removed with the other in place.  Stable right internal jugular vein center venous catheter.  Upper normal heart size.  IMPRESSION: One of the left chest tubes has been removed and the left apical pneumothorax is improved.  Original Report Authenticated By: Donavan Burnet, M.D.    INR: Will add last result for INR, ABG once components are confirmed Will add last 4 CBG results once components are confirmed  Assessment/Plan: S/P Procedure(s) (LRB): THORACOTOMY/LOBECTOMY (Left) VIDEO BRONCHOSCOPY (N/A)  1. Doing well ,push rehab/pulm toilet 2. Stable for discharge   LOS: 5 days    Brennan Litzinger E 4/2/201312:21 PM

## 2011-12-26 NOTE — Discharge Summary (Signed)
[address]            [place] [address]          [phone]      KATHLEE BARNHARDT [birth year] 61 y.o. [id number]  12/21/2011   Alleen Borne, MD  Ca lung  HPI:  61 year old ex-smoker, presents for evaluation of hemoptysis and abnormal imaging.  She is on weekly methotrexate for psoriasis and has not taken this in 3 weeks. She presented to [facility] on 11/05/2011 with hemoptysis for 3-4 days, low-grade fever and dyspnea. Chest x-ray showed a cavitary mass in the left lower lobe measuring 5 cm. CT chest with contrast showed a thickwalled cavitary mass medially in the left lower lobe measuring 5.3x3.9x5.6 cm with an enlarged left hilar lymph node. There were a few borderline enlarged axillary and porta hepatis lymph nodes. No other parenchymal abnormality was noted. Her white cell count was 9.6, she reported a 20 pound weight loss but no night sweats. Quantiferon Taevyn Hausen was negative per patient. Sputum AFB was negative as were blood cultures. She was treated with Zosyn, Levaquin and clindamycin. She was seen by Dr. Meredeth Ide, CT-guided biopsy was scheduled since patient requested to go home and she was discharged on Augmentin 875 twice a day. Repeat CT chest was performed and showed that the left lower lobe mass had decreased slightly in size and cavitation had increased, hence biopsy was deferred and it was decided to follow this lesion to resolution.  Meanwhile she continued to have hemoptysis about 1/4 cup per day. Low-grade fevers had persisted on Augmentin. She was therefore referred to pulmonary medicine. Bronchoscopy was performed which showed an endobronchial lesion within the subsegmental bronchus of the left lower lobe causing obstruction there. This was very friable and with brushing there was brisk bleeding. Hemostasis was achieved and no further biopsies or brushings were taken. 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 She was made this hospitalization for resection  Family History   Problem  Relation  Age of Onset   .  Coronary artery disease  Mother    .  Lung cancer  Father    .  Breast cancer  Paternal Aunt    .  Cirrhosis  Maternal Grandmother    .  Anesthesia problems  Neg Hx    Social History  History   Substance Use Topics   .  Smoking status:  Former Smoker -- 1.0 packs/day for 25 years     Types:  Cigarettes     Quit date:  06/25/2010   .  Smokeless tobacco:  Never Used   .  Alcohol Use:  No    Current Outpatient Prescriptions   Medication  Sig  Dispense  Refill   .  ergocalciferol (VITAMIN D2) 50000 UNITS capsule  Take 50,000 Units by mouth every 30 (thirty) days.     .  folic acid (FOLVITE) 1 MG tablet  Take 1 mg by mouth daily.     Marland Kitchen  acetaminophen (TYLENOL) 325 MG tablet  Take 325 mg by mouth every 6 (six) hours as needed. For pain     .  bisacodyl (DULCOLAX) 5 MG EC tablet  Take 5 mg by mouth daily as needed.     .  calcium carbonate (TUMS EX) 750 MG chewable tablet  Chew 1 tablet by mouth daily as needed.     Marland Kitchen  fluocinonide ointment (LIDEX) 0.05 %  Apply 1 application topically 2 (two) times daily as needed. For psoriasis     .  methotrexate (RHEUMATREX) 15 MG tablet  Take 15 mg by mouth once a week. Caution: Chemotherapy. Protect from light., takes for psoriasis  STOPPED- Feb. 2013, due to lung ca. , ? restarting     .  Probiotic Product (PROBIOTIC PO)  Take 1 capsule by mouth daily.      Allergies   Allergen  Reactions   .  Aspirin      REACTION: stomach burning   .  Codeine      Upset stomach   .  Pravastatin Sodium      REACTION: mood changes and snapping at everyone   Review of Systems: At time of consultation Gen.: She denies any fever or chills. Appetite has been decreased and she has had significant weight loss. She denies fatigue.  Eyes: Negative  ENT: Negative  Endocrine: Denies diabetes and hypothyroidism.  Cardiovascular: She denies any chest  pain or pressure. She denies exertional dyspnea, orthopnea, and PND. She's had no peripheral edema or palpitations.  Respiratory: She does report cough and hemoptysis of about one quarter cup at a time. She has not had hemoptysis over the past week.  GI: she denies nausea and vomiting. She has had no dysphagia. She denies melena and bright red blood per rectum. She does have a history of hiatal hernia and reflux.  GU: She denies dysuria and hematuria.  Neurological: Denies any headaches. She denies any focal weakness or numbness. She denies dizziness and syncope. She's never had a TIA or stroke.  Musculoskeletal: She denies arthralgias and myalgias.  Hematological: She denies any history of bleeding disorders or easy bleeding.  Vascular: She denies any history of DVT or phlebitis.  BP 165/100  Pulse 102  Resp 20  Ht 5\' 5"  (1.651 m)  Wt 130 lb (58.968 kg)  BMI 21.63 kg/m2  SpO2 97%  Physical Exam: At time of consultation She is a well-developed white female in no distress.  HEENT: Normocephalic and atraumatic. Pupils are equal and reactive to light and accommodation. Extraocular muscle are intact. Oropharynx is clear.  Neck: Carotid pulses are palpable bilaterally. No bruits. There is no cervical or supraclavicular adenopathy.  Lungs: Clear  Heart: Regular rate and rhythm with normal S1 and S2. There is no murmur, rub, or gallop.  Abdomen: Bowel sounds are present. Soft and nontender. No palpable masses or organomegaly.  Extremities: Pink and warm. Pedal pulses are palpable bilaterally.  Neurological: Alert and oriented x3. Motor and sensory exam is grossly normal.  Skin: She has psoriasis.    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  POSTOPERATIVE DIAGNOSIS: Cavitary  non-small-cell lung cancer of the  left lower lobe with hemoptysis.  OPERATIVE PROCEDURE: Flexible fiberoptic bronchoscopy, left muscle-  sparing thoracotomy with left lower lobectomy and lymph node dissection.  ATTENDING SURGEON: Evelene Croon, MD  ASSISTANT: Coral Ceo, PA-C  ANESTHESIA: General endotracheal.  The patient was then taken to the postanesthesia care unit in satisfactory and stable condition.  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 For full details please see the dictated report. Clinically she has progressed nicely. All routine lines, monitors, and drainage devices  have been discontinued in the standard fashion. She has an acute blood loss anemia which has stabilized. Started on a low-dose beta blocker for hypertension and tachycardia. Incisions are healing well evidence of infection. Oxygen has been weaned and she maintains good saturations on room air. She is tolerating gradually increasing activities using standard protocols. Overall she is felt to be stable for discharge on today's date in stable condition.  Basename 12/26/11 0637  NA 137  K 3.4*  CL 98  CO2 31  GLUCOSE 106*  BUN 10  CALCIUM 8.9    Basename 12/26/11 0637  WBC 10.0  HGB 9.1*  HCT 28.8*  PLT 422*   No results found for this basename: INR:2 in the last 72 hours   Discharge Instructions:  The patient is discharged to home with extensive instructions on wound care and progressive ambulation.  They are instructed not to drive or perform any heavy lifting until returning to see the physician in his office.  Discharge Diagnosis:  Ca lung  Secondary Diagnosis: Patient Active Problem List  Diagnoses  . HYPERLIPIDEMIA  . PSORIASIS  . GERD  . OSTEOPENIA  . BLOOD IN STOOL  . Pulmonary abscess  . Lung cancer   Past Medical History  Diagnosis Date  . Psoriasis     has used methotrexate since she was 61y.o., when  psoriasis flares. Currently methotrexate on hold since 11/05/2011   . Hyperlipidemia   . Osteopenia   . GERD (gastroesophageal reflux disease)   . Arthritis     arm  . Hiatal hernia   . Hypertension     treated for while in hosp. 10/2011, given med. , unsure of name of med., not told to continue   . Coughing up blood     10/2011, [facility] to [facility]   . Recurrent upper respiratory infection (URI)     lung infection, treated /w antibiotic - 10/2011  . Cancer     lung cancer       Darianna, Amy  Home Medication Instructions [id number]   Printed on:12/26/11 1234  Medication Information                    folic acid (FOLVITE) 1 MG tablet Take 1 mg by mouth daily.            ergocalciferol (VITAMIN D2) 50000 UNITS capsule Take 50,000 Units by mouth every 30 (thirty) days.            fluocinonide ointment (LIDEX) 0.05 % Apply 1 application topically 2 (two) times daily as needed. For psoriasis           Probiotic Product (PROBIOTIC PO) Take 1 capsule by mouth daily.           calcium carbonate (TUMS EX) 750 MG chewable tablet Chew 1 tablet by mouth daily as needed.           bisacodyl (DULCOLAX) 5 MG EC tablet Take 5 mg by mouth daily as needed.           metoprolol tartrate (LOPRESSOR) 25 MG tablet Take 1 tablet (25 mg total) by mouth 2 (two) times daily.           oxyCODONE-acetaminophen (PERCOCET) 5-325 MG per tablet Take 1-2 tablets by mouth every 4 (four) hours as needed.             Disposition: For discharge home  Patient's condition is Good  Gershon Crane, PA-C 12/26/2011  12:34 PM

## 2012-01-01 ENCOUNTER — Encounter: Payer: 59 | Admitting: Internal Medicine

## 2012-01-01 LAB — AFB CULTURE WITH SMEAR (NOT AT ARMC)

## 2012-01-02 ENCOUNTER — Ambulatory Visit (INDEPENDENT_AMBULATORY_CARE_PROVIDER_SITE_OTHER): Payer: Self-pay

## 2012-01-02 DIAGNOSIS — Z4802 Encounter for removal of sutures: Secondary | ICD-10-CM

## 2012-01-02 DIAGNOSIS — C3430 Malignant neoplasm of lower lobe, unspecified bronchus or lung: Secondary | ICD-10-CM

## 2012-01-02 NOTE — Progress Notes (Signed)
Removed 2 sutures from chest tube sites with not signs of infection, Pt tolerated well. Post-op appt with Dr Laneta Simmers on 01/09/2012 with CXR

## 2012-01-05 ENCOUNTER — Other Ambulatory Visit: Payer: Self-pay

## 2012-01-05 DIAGNOSIS — G8918 Other acute postprocedural pain: Secondary | ICD-10-CM

## 2012-01-05 MED ORDER — TRAMADOL HCL 50 MG PO TABS: 50.0000 mg | ORAL_TABLET | Freq: Four times a day (QID) | ORAL | Status: DC | PRN

## 2012-01-05 NOTE — Telephone Encounter (Signed)
Called in RX for Tramadol # 40/0 to pharm. S/P Video Bronch, Lt thoracotomy, Lt lower Lobectomy on 12/21/11.

## 2012-01-09 ENCOUNTER — Other Ambulatory Visit: Payer: Self-pay | Admitting: Surgery

## 2012-01-09 ENCOUNTER — Encounter: Payer: 59 | Admitting: Surgery

## 2012-01-09 DIAGNOSIS — C3430 Malignant neoplasm of lower lobe, unspecified bronchus or lung: Secondary | ICD-10-CM

## 2012-01-12 ENCOUNTER — Encounter: Payer: Self-pay | Admitting: Surgery

## 2012-01-12 ENCOUNTER — Ambulatory Visit (INDEPENDENT_AMBULATORY_CARE_PROVIDER_SITE_OTHER): Payer: Self-pay | Admitting: Surgery

## 2012-01-12 ENCOUNTER — Ambulatory Visit
Admission: RE | Admit: 2012-01-12 | Discharge: 2012-01-12 | Disposition: A | Discharge status: Home / Self Care | Payer: 59 | Source: Ambulatory Visit | Attending: Surgery | Admitting: Surgery

## 2012-01-12 VITALS — BP 170/97 | HR 88 | Resp 18 | Ht 65.0 in | Wt 131.0 lb

## 2012-01-12 DIAGNOSIS — Z9889 Other specified postprocedural states: Secondary | ICD-10-CM

## 2012-01-12 DIAGNOSIS — C3490 Malignant neoplasm of unspecified part of unspecified bronchus or lung: Secondary | ICD-10-CM

## 2012-01-12 DIAGNOSIS — Z902 Acquired absence of lung [part of]: Secondary | ICD-10-CM

## 2012-01-12 DIAGNOSIS — C3430 Malignant neoplasm of lower lobe, unspecified bronchus or lung: Secondary | ICD-10-CM

## 2012-01-12 NOTE — Progress Notes (Signed)
  HPI:  Patient returns for routine postoperative follow-up having undergone left lower lobectomy on 12/21/2011. The patient's early postoperative recovery while in the hospital was notable for an uncomplicated postoperative course. Since hospital discharge the patient reports she has been feeling fairly well. She says she still has a little shortness of breath with exertion and some numbness and pain in her left chest wall. 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   Current Outpatient Prescriptions  Medication Sig Dispense Refill  . bisacodyl (DULCOLAX) 5 MG EC tablet Take 5 mg by mouth daily as needed.      . calcium carbonate (TUMS EX) 750 MG chewable tablet Chew 1 tablet by mouth daily as needed.      . ergocalciferol (VITAMIN D2) 50000 UNITS capsule Take 50,000 Units by mouth every 30 (thirty) days.       . fluocinonide ointment (LIDEX) 0.05 % Apply 1 application topically 2 (two) times daily as needed. For psoriasis      . metoprolol tartrate (LOPRESSOR) 25 MG tablet Take 1 tablet (25 mg total) by mouth 2 (two) times daily.  60 tablet  1  . Probiotic Product (PROBIOTIC PO) Take 1 capsule by mouth daily.      . traMADol (ULTRAM) 50 MG tablet Take 1 tablet (50 mg total) by mouth every 6 (six) hours as needed for pain.  40 tablet  0    Physical Exam: BP 170/97  Pulse 88  Resp 18  Ht 5\' 5"  (1.651 m)  Wt 131 lb (59.421 kg)  BMI 21.80 kg/m2  SpO2 98% She looks well.  Exam is clear. The left thoracotomy incision is healing well.  Diagnostic Tests:  Chest x-ray today shows mild postoperative changes at the left base.  Impression:  Overall she's made a good recovery following her surgery. I told her that I would expect it to take up to 6 months for her stamina to get back to normal and her chest wall symptoms to resolve. I encouraged her to continue walking as well as possible. I asked her not to life anything heavier than 10 pounds for a total of 8  weeks from date of surgery.  Plan:  We'll schedule a consultation appointment with Dr.Mohamed of medical oncology and we will plan to see her back in 3 months with a chest x-ray.

## 2012-01-17 ENCOUNTER — Telehealth: Payer: Self-pay | Admitting: *Deleted

## 2012-01-17 NOTE — Telephone Encounter (Signed)
Spoke with pt regarding appt time and place 01/18/12 at 3:30 mtoc.  She verbalized understanding of time and place of appt.

## 2012-01-18 ENCOUNTER — Encounter: Payer: Self-pay | Admitting: *Deleted

## 2012-01-18 ENCOUNTER — Ambulatory Visit (HOSPITAL_BASED_OUTPATIENT_CLINIC_OR_DEPARTMENT_OTHER): Payer: 59 | Admitting: Internal Medicine

## 2012-01-18 ENCOUNTER — Encounter: Payer: Self-pay | Admitting: Internal Medicine

## 2012-01-18 VITALS — BP 157/84 | HR 76 | Temp 98.7°F | Resp 18 | Ht 65.0 in | Wt 133.0 lb

## 2012-01-18 DIAGNOSIS — C3430 Malignant neoplasm of lower lobe, unspecified bronchus or lung: Secondary | ICD-10-CM

## 2012-01-18 DIAGNOSIS — C3490 Malignant neoplasm of unspecified part of unspecified bronchus or lung: Secondary | ICD-10-CM

## 2012-01-18 NOTE — Progress Notes (Signed)
[facility] [phone]   [phone]  CONSULT NOTE  REASON FOR CONSULTATION:  61 years old white female diagnosed with lung cancer.  HPI Regina Grimes is a 61 y.o. female was past medical history significant for psoriasis, hypertension, osteopenia, GERD, hiatal hernia and arthritis. She also has a history of smoking but quit 2 years ago. The patient mentions that on 11/05/2011 she started having coughing with hemoptysis and shortness of breath. She had chest x-ray performed at [facility] on 11/05/2011 and it showed a cavitary mass lesion in the left lower lobe posteriorly suspicious for abscess versus malignancy. CT scan of the chest was performed on the same day and it showed a thickwalled cavitary mass medially in the left lower lobe it measured 5.3 x 3.9 x 5.6 CM. There was an enlarging left hilar lymph node. There was no other pulmonary parenchymal masses as to whether was in the lung. No pleural nor pericardial effusion. The patient was referred to [name] and on 11/16/2011, she underwent bronchoscopy. The bronchial washing of the left lower lobe showed malignant cells consistent with adenocarcinoma. A PET scan was performed on 12/05/2011 and showed markedly FDG avid negativity left lower lobe lung mass with no mediastinal or hilar lymphadenopathy and no findings for metastatic disease involving the lungs, bones, abdomen or pelvis. The patient was referred to Dr. Laneta Simmers and on 12/21/2011 the patient underwent flexible fiberoptic bronchoscopy, left muscle-sparing thoracotomy with left lower lobe lobectomy with lymph node dissection. 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 The patient is feeling fine today but still has generalized weakness  and shortness of breath with exertion was mild cough no more hemoptysis she has some soreness in the left side of her chest. She denied having any significant weight loss no headache or blurry vision.  The patient is married and has 2 children she works at [employer] in [employer].  @SFHPI @  Past Medical History  Diagnosis Date  . Psoriasis     has used methotrexate since she was 61y.o., when psoriasis flares. Currently methotrexate on hold since 11/05/2011   . Hyperlipidemia   . Osteopenia   . GERD (gastroesophageal reflux disease)   . Arthritis     arm  . Hiatal hernia   . Hypertension     treated for while in hosp. 10/2011, given med. , unsure of name of med., not told to continue   . Coughing up blood     10/2011, admitted to [facility]   . Recurrent upper respiratory infection (URI)     lung infection, treated /w antibiotic - 10/2011  . Cancer     lung cancer    Past Surgical History  Procedure Date  . Colonoscopy 8/02  . Esophagogastroduodenoscopy 8/02  . Tubal ligation 1982  . Benign tumors of left foot 1992/1994  . Vd     x 2  . Abnormal pap     low grade intrepithelial lesion. Colposcipy okay  . Video bronchoscopy 11/16/2011    Procedure: VIDEO BRONCHOSCOPY WITH FLUORO;  Surgeon: Comer Locket. Vassie Loll, MD;  Location: [facility];  Service: Cardiopulmonary;  Laterality: Bilateral;  . Foot neuroma surgery     L foot- 1990's  . Appendectomy     1982- along /w tubal ligation   . Video bronchoscopy 12/21/2011  Procedure: VIDEO BRONCHOSCOPY;  Surgeon: Alleen Borne, MD;  Location: [facility] OR;  Service: Thoracic;  Laterality: N/A;    Family History  Problem Relation Age of Onset  . Coronary artery disease Mother   . Lung cancer Father   . Breast cancer Paternal Aunt   . Cirrhosis Maternal Grandmother   . Anesthesia problems Neg Hx     Social History History  Substance Use Topics  . Smoking status: Former Smoker -- 1.0 packs/day for 25 years    Types: Cigarettes    Quit date:  06/25/2010  . Smokeless tobacco: Never Used  . Alcohol Use: No    Allergies  Allergen Reactions  . Aspirin     REACTION: stomach burning  . Codeine     Upset stomach  . Pravastatin Sodium     REACTION: mood changes and snapping at everyone    Current Outpatient Prescriptions  Medication Sig Dispense Refill  . bisacodyl (DULCOLAX) 5 MG EC tablet Take 5 mg by mouth daily as needed.      . calcium carbonate (TUMS EX) 750 MG chewable tablet Chew 1 tablet by mouth daily as needed.      . fluocinonide ointment (LIDEX) 0.05 % Apply 1 application topically 2 (two) times daily as needed. For psoriasis      . metoprolol tartrate (LOPRESSOR) 25 MG tablet Take 1 tablet (25 mg total) by mouth 2 (two) times daily.  60 tablet  1  . Probiotic Product (PROBIOTIC PO) Take 1 capsule by mouth daily.      . traMADol (ULTRAM) 50 MG tablet Take 1 tablet (50 mg total) by mouth every 6 (six) hours as needed for pain.  40 tablet  0  . ergocalciferol (VITAMIN D2) 50000 UNITS capsule Take 50,000 Units by mouth every 30 (thirty) days.         Review of Systems  A comprehensive review of systems was negative except for: Constitutional: positive for fatigue Respiratory: positive for cough, dyspnea on exertion and pleurisy/chest pain  Physical Exam  NWG:NFAOZ, healthy, no distress, well nourished and well developed SKIN: skin color, texture, turgor are normal HEAD: Normocephalic, No masses, lesions, tenderness or abnormalities EYES: normal EARS: External ears normal OROPHARYNX:no exudate and no erythema  NECK: supple, no adenopathy LYMPH:  no palpable lymphadenopathy, no hepatosplenomegaly BREAST:not examined LUNGS: clear to auscultation , and palpation HEART: regular rate & rhythm, no murmurs and no gallops ABDOMEN:abdomen soft, non-tender, normal bowel sounds and no masses or organomegaly BACK: Back symmetric, no curvature. EXTREMITIES:no joint deformities, effusion, or inflammation, no edema, no  skin discoloration, no clubbing, no cyanosis  NEURO: alert & oriented x 3 with fluent speech, no focal motor/sensory deficits  PERFORMANCE STATUS: ECOG   Studies/Results: Dg Chest 2 View  01/12/2012  *RADIOLOGY REPORT*  Clinical Data: 61 year old female status post left lung cancer surgery 3 weeks ago.  CHEST - 2 VIEW  Comparison: 12/26/2011 and earlier.  Findings: Small to moderate left pleural effusion has mildly decreased.  Ventilation is improved at the left lung base with less nodular opacity.  Left pneumothorax no longer visible.  The right lung is stable and clear except for a focal scarring.  Stable cardiac size and mediastinal contours.  No acute osseous abnormality identified.  IMPRESSION: Mildly decreased left pleural effusion and improved left lung ventilation.  No new cardiopulmonary abnormality.  Original Report Authenticated By: Harley Hallmark, M.D.   Dg Chest 2 View  12/26/2011  *RADIOLOGY REPORT*  Clinical Data: Postop (left  lower lobectomy), cough, congestion  CHEST - 2 VIEW  Comparison: 12/25/2011; 12/24/2011; 12/23/2011  Findings:  Grossly unchanged cardiac silhouette and mediastinal contours. Unchanged position of right jugular approach and venous catheter with tip overlying the distal SVC.  Grossly unchanged mild elevation of the left hemidiaphragm with small left-sided effusion and left basilar heterogeneous / consolidative opacities. No new focal airspace opacities.  There is persistent blunting of the right costophrenic angle which may suggest a small right-sided pleural effusion.  Unchanged small hiatal hernia.  There is grossly unchanged slightly asymmetric right apical pleural parenchymal thickening.  Unchanged tiny left apical pneumothorax.  Grossly unchanged bones.  IMPRESSION: 1.  Unchanged tiny left apical pneumothorax. 2.  Grossly unchanged small left-sided effusion and left basilar heterogeneous / consolidative opacities, atelectasis versus infiltrate.  Original Report  Authenticated By: Waynard Reeds, M.D.   Dg Chest Port 1 View  12/25/2011  *RADIOLOGY REPORT*  Clinical Data: Chest tube removal.  PORTABLE CHEST - 1 VIEW  Comparison: 12/25/2011 6:12 a.m.  Findings: Left-sided chest tube removed.  Small left apical pneumothorax without change.  Left-sided pleural effusion.  Elevated left hemidiaphragm.  Right central line tip distal superior vena cava level.  Heart size within normal limits.  Mild central pulmonary vascular prominence.  IMPRESSION: Left-sided chest tube removed.  Small left apical pneumothorax without change.  Original Report Authenticated By: Fuller Canada, M.D.   Dg Chest Port 1 View  12/25/2011  *RADIOLOGY REPORT*  Clinical Data: Left lower lobectomy  PORTABLE CHEST - 1 VIEW  Comparison: 12/24/2011  Findings: Improved left apical pneumothorax which is now less than 5%.  Stable volume loss at the left base.  While the left chest tubes has been removed with the other in place.  Stable right internal jugular vein center venous catheter.  Upper normal heart size.  IMPRESSION: One of the left chest tubes has been removed and the left apical pneumothorax is improved.  Original Report Authenticated By: Donavan Burnet, M.D.   Dg Chest Port 1 View  12/24/2011  *RADIOLOGY REPORT*  Clinical Data: Chest tubes  PORTABLE CHEST - 1 VIEW  Comparison:   the previous day's study  Findings: Two left chest tubes remain in place with a slight increase in a relatively small apical pneumothorax extending down to the posterior aspect of the third rib.  Right IJ central line stable.  Patchy atelectasis at the left lung base.  IMPRESSION:  1.  Slight increase in size of small   left apical pneumothorax with stable left chest tubes.  Original Report Authenticated By: Osa Craver, M.D.   Dg Chest Portable 1 View  12/23/2011  *RADIOLOGY REPORT*  Clinical Data: Left thoracotomy and lobectomy.  PORTABLE CHEST - 1 VIEW  Comparison:   the previous day's study  Findings:  Right IJ central line and 2 left chest tubes remain in place.  No definite pneumothorax.  Heart size normal.  Patchy atelectasis or infiltrate at the left lung base stable.  Right lung clear.  IMPRESSION:  1.  Little change in the left lower lobe atelectasis/infiltrate. 2. Support hardware stable in position.  No pneumothorax.  Original Report Authenticated By: Osa Craver, M.D.   Dg Chest Portable 1 View  12/22/2011  *RADIOLOGY REPORT*  Clinical Data: Status post lobectomy  PORTABLE CHEST - 1 VIEW  Comparison: 12/21/2011  Findings: Two left chest tubes remaining good position.  Previously questioned extrapleural air in the retrocardiac region less prominent on today's film  not clearly representing a basilar pneumothorax.  Slight left basilar atelectasis and left pleural effusion.  Right lung clear. Central venous catheter tip SVC/RA junction.  Subcutaneous air left chest wall is stable.  IMPRESSION:  No visible apical pneumothorax.  No definite extrapleural air in the retrocardiac space.  Improved basilar atelectasis.  Original Report Authenticated By: Elsie Stain, M.D.   Dg Chest Portable 1 View  12/21/2011  *RADIOLOGY REPORT*  Clinical Data: Post thoracic surgery  PORTABLE CHEST - 1 VIEW  Comparison: Portable exam 1400 hours compared to 12/18/2011  Findings: New left thoracostomy tubes, tips at lateral mid left chest and at left apex. New right jugular central venous catheter, tip projecting over SVC. Heart appears prominent size. Mediastinal contours and pulmonary vascularity normal. Left basilar atelectasis. No gross pleural effusion or segmental infiltrate. Gas at medial left lung base may represent a small loculated pneumothorax or aerated lung. Bones unremarkable. Previously seen left infrahilar mass no longer identified. No right pneumothorax seen.  IMPRESSION: Two left thoracostomy tubes with question small loculated pneumothorax versus aerated lung at medial left base.  Original Report  Authenticated By: Lollie Marrow, M.D.     ASSESSMENT: This is a very pleasant 61 years old white female recently diagnosed with a stage IIA (T2b, N0, M0) non-small cell lung cancer, consistent with squama cell carcinoma status post left lower lobectomy with lymph node dissection.   PLAN: I have a lengthy discussion with the patient and her husband today about her disease stage, prognosis and treatment options. I showed them data from adjuvant online which showed a 5 years survival benefit of around 6% with adjuvant chemotherapy with a platinum based regimen.  I also discussed with the patient the option of enrollment in a clinical trial according to the ECoG protocol 1505 where the patient would be treated with standard chemotherapy plus minus Avastin.  She is interested in proceeding with systemic chemotherapy but she would like to take a close to home in [place]. I will arrange for the patient an appointment with a medical oncologist in [place]. She knows to call me if she has any concerns or questions. I gave the patient and her husband the time to ask questions and I answered them completely to their satisfactions All questions were answered. The patient knows to call the clinic with any problems, questions or concerns. We can certainly see the patient much sooner if necessary.  Thank you so much for allowing me to participate in the care of Regina Grimes. I will continue to follow up the patient with you and assist in her care.  I spent 25 minutes counseling the patient face to face. The total time spent in the appointment was 50 minutes.   Ourania Hamler K. 01/18/2012, 5:03 PM

## 2012-01-18 NOTE — Progress Notes (Signed)
Spoke with pt at [facility] 01/18/12.  Educational/resource information given.

## 2012-01-18 NOTE — Progress Notes (Signed)
[facility] Brief Psychosocial Assessment Clinical Social Work  Clinical Social Work met with patient, patient's spouse, MD, and pharmacist in [place]. MD discussed patient's diagnosis and treatment options in depth.  Patient and spouse had many questions which MD addressed. Patient unsure whether she'd like to receive treatment closer to home at [facility].  Patient and spouse plan to discuss at home and follow up regarding their decision.  CSW briefly discussed support services at [facility] and encouraged patient to call with any questions or concerns.  Kathrin Penner, MSW, [facility] Clinical Social Worker [facility] [phone]

## 2012-01-19 ENCOUNTER — Telehealth: Payer: Self-pay | Admitting: Internal Medicine

## 2012-01-19 ENCOUNTER — Encounter: Payer: Self-pay | Admitting: *Deleted

## 2012-01-19 NOTE — Telephone Encounter (Signed)
s/w pt and she is aware of her 5/1 mri,message to nurse to ck on staples from surg    aom

## 2012-01-22 ENCOUNTER — Other Ambulatory Visit: Payer: Self-pay | Admitting: Surgery

## 2012-01-22 ENCOUNTER — Telehealth: Payer: Self-pay | Admitting: *Deleted

## 2012-01-22 DIAGNOSIS — G8918 Other acute postprocedural pain: Secondary | ICD-10-CM

## 2012-01-22 NOTE — Telephone Encounter (Signed)
Spoke with pt today.  She would like to get treatment closer to home.  Called [place] and set up appt time.  Pt is aware of time and place and verbalized understanding

## 2012-01-24 ENCOUNTER — Telehealth: Payer: Self-pay | Admitting: *Deleted

## 2012-01-24 ENCOUNTER — Ambulatory Visit (HOSPITAL_COMMUNITY)
Admission: RE | Admit: 2012-01-24 | Discharge: 2012-01-24 | Disposition: A | Discharge status: Home / Self Care | Payer: 59 | Source: Ambulatory Visit | Attending: Internal Medicine | Admitting: Internal Medicine

## 2012-01-24 DIAGNOSIS — Z8673 Personal history of transient ischemic attack (TIA), and cerebral infarction without residual deficits: Secondary | ICD-10-CM | POA: Insufficient documentation

## 2012-01-24 DIAGNOSIS — Z902 Acquired absence of lung [part of]: Secondary | ICD-10-CM | POA: Insufficient documentation

## 2012-01-24 DIAGNOSIS — C3490 Malignant neoplasm of unspecified part of unspecified bronchus or lung: Secondary | ICD-10-CM | POA: Insufficient documentation

## 2012-01-24 MED ORDER — GADOBENATE DIMEGLUMINE 529 MG/ML IV SOLN: 15.0000 mL | Freq: Once | INTRAVENOUS | Status: AC | PRN

## 2012-01-24 NOTE — Telephone Encounter (Signed)
Pt called wanting to know MRI brain results, per Dr Donnald Garre okay to tell pt it was okay.  Called and informed pt, she verbalized understanding. SLJ

## 2012-01-31 ENCOUNTER — Ambulatory Visit: Payer: Self-pay | Admitting: Oncology

## 2012-02-15 ENCOUNTER — Telehealth: Payer: Self-pay | Admitting: *Deleted

## 2012-02-15 NOTE — Telephone Encounter (Signed)
Spoke with pt today regarding gas card information.

## 2012-02-24 ENCOUNTER — Ambulatory Visit: Payer: Self-pay | Admitting: Oncology

## 2012-02-28 LAB — CBC CANCER CENTER
Basophil #: 0.1 x10 3/mm (ref 0.0–0.1)
Eosinophil #: 0.1 x10 3/mm (ref 0.0–0.7)
HCT: 31.5 % — ABNORMAL LOW (ref 35.0–47.0)
HGB: 9.7 g/dL — ABNORMAL LOW (ref 12.0–16.0)
Lymphocyte #: 2.7 x10 3/mm (ref 1.0–3.6)
MCH: 23.3 pg — ABNORMAL LOW (ref 26.0–34.0)
MCV: 76 fL — ABNORMAL LOW (ref 80–100)
Monocyte #: 0.9 x10 3/mm (ref 0.2–0.9)
Neutrophil #: 7.5 x10 3/mm — ABNORMAL HIGH (ref 1.4–6.5)
Neutrophil %: 66 %
RBC: 4.17 10*6/uL (ref 3.80–5.20)

## 2012-02-28 LAB — COMPREHENSIVE METABOLIC PANEL
Albumin: 3.7 g/dL (ref 3.4–5.0)
Alkaline Phosphatase: 120 U/L (ref 50–136)
Anion Gap: 9 (ref 7–16)
BUN: 15 mg/dL (ref 7–18)
Chloride: 106 mmol/L (ref 98–107)
Co2: 27 mmol/L (ref 21–32)
EGFR (African American): >60
EGFR (Non-African Amer.): >60
Osmolality: 284 (ref 275–301)
Potassium: 3.9 mmol/L (ref 3.5–5.1)
SGPT (ALT): 23 U/L
Sodium: 142 mmol/L (ref 136–145)
Total Protein: 7.7 g/dL (ref 6.4–8.2)

## 2012-03-20 LAB — COMPREHENSIVE METABOLIC PANEL
Albumin: 3.7 g/dL (ref 3.4–5.0)
Alkaline Phosphatase: 110 U/L (ref 50–136)
BUN: 12 mg/dL (ref 7–18)
Co2: 26 mmol/L (ref 21–32)
EGFR (African American): >60
EGFR (Non-African Amer.): >60
Glucose: 117 mg/dL — ABNORMAL HIGH (ref 65–99)
Osmolality: 286 (ref 275–301)
Potassium: 4 mmol/L (ref 3.5–5.1)
SGOT(AST): 19 U/L (ref 15–37)
SGPT (ALT): 21 U/L
Sodium: 143 mmol/L (ref 136–145)
Total Protein: 7.7 g/dL (ref 6.4–8.2)

## 2012-03-20 LAB — CBC CANCER CENTER
Basophil #: 0 x10 3/mm (ref 0.0–0.1)
Basophil %: 0.3 %
Eosinophil #: 0.2 x10 3/mm (ref 0.0–0.7)
Eosinophil %: 1.6 %
HGB: 10.4 g/dL — ABNORMAL LOW (ref 12.0–16.0)
Lymphocyte #: 2.1 x10 3/mm (ref 1.0–3.6)
MCH: 24.4 pg — ABNORMAL LOW (ref 26.0–34.0)
MCHC: 31.3 g/dL — ABNORMAL LOW (ref 32.0–36.0)
MCV: 78 fL — ABNORMAL LOW (ref 80–100)
Monocyte #: 0.9 x10 3/mm (ref 0.2–0.9)
Monocyte %: 8.9 %
Neutrophil %: 66.7 %
Platelet: 304 x10 3/mm (ref 150–440)
RBC: 4.27 10*6/uL (ref 3.80–5.20)
RDW: 21.6 % — ABNORMAL HIGH (ref 11.5–14.5)

## 2012-03-25 ENCOUNTER — Ambulatory Visit: Payer: Self-pay | Admitting: Oncology

## 2012-04-10 LAB — COMPREHENSIVE METABOLIC PANEL
Calcium, Total: 9.4 mg/dL (ref 8.5–10.1)
Chloride: 103 mmol/L (ref 98–107)
EGFR (African American): >60
Glucose: 104 mg/dL — ABNORMAL HIGH (ref 65–99)
Osmolality: 280 (ref 275–301)
Potassium: 3.6 mmol/L (ref 3.5–5.1)
SGOT(AST): 17 U/L (ref 15–37)
SGPT (ALT): 22 U/L
Sodium: 140 mmol/L (ref 136–145)
Total Protein: 7.5 g/dL (ref 6.4–8.2)

## 2012-04-10 LAB — CBC CANCER CENTER
Basophil #: 0 x10 3/mm (ref 0.0–0.1)
Basophil %: 0.3 %
HGB: 11.2 g/dL — ABNORMAL LOW (ref 12.0–16.0)
Lymphocyte %: 28.1 %
MCH: 25.8 pg — ABNORMAL LOW (ref 26.0–34.0)
MCHC: 32.6 g/dL (ref 32.0–36.0)
MCV: 79 fL — ABNORMAL LOW (ref 80–100)
Monocyte %: 9 %
Neutrophil #: 5.6 x10 3/mm (ref 1.4–6.5)
Neutrophil %: 61.1 %
RDW: 26 % — ABNORMAL HIGH (ref 11.5–14.5)

## 2012-04-16 ENCOUNTER — Ambulatory Visit (INDEPENDENT_AMBULATORY_CARE_PROVIDER_SITE_OTHER): Payer: 59 | Admitting: Surgery

## 2012-04-16 ENCOUNTER — Other Ambulatory Visit: Payer: Self-pay | Admitting: Surgery

## 2012-04-16 ENCOUNTER — Ambulatory Visit
Admission: RE | Admit: 2012-04-16 | Discharge: 2012-04-16 | Disposition: A | Discharge status: Home / Self Care | Payer: 59 | Source: Ambulatory Visit | Attending: Surgery | Admitting: Surgery

## 2012-04-16 ENCOUNTER — Encounter: Payer: Self-pay | Admitting: Surgery

## 2012-04-16 VITALS — BP 165/92 | HR 88 | Temp 98.4°F | Resp 16 | Ht 65.0 in | Wt 141.5 lb

## 2012-04-16 DIAGNOSIS — C3490 Malignant neoplasm of unspecified part of unspecified bronchus or lung: Secondary | ICD-10-CM

## 2012-04-16 DIAGNOSIS — Z9889 Other specified postprocedural states: Secondary | ICD-10-CM

## 2012-04-16 NOTE — Progress Notes (Signed)
[address]            [name] [address]          [phone]      HPI:  The patient returns today for postoperative followup status post left lower lobectomy on 12/21/2011 for a stage T2b No (stage IIA) squamous cell carcinoma of the lung. She was seen in consultation by Dr. Arbutus Ped postoperatively and was referred to an oncologist at [facility] where she has just completed adjuvant chemotherapy. She said it was a little rough. She has mild left chest wall discomfort and numbness related to the incision. She occasionally has some clear sputum but denies any significant cough. She has had no chest pain. She denies shortness of breath. She is planning on returning to work soon.  Current Outpatient Prescriptions  Medication Sig Dispense Refill  . bisacodyl (DULCOLAX) 5 MG EC tablet Take 5 mg by mouth daily as needed.      . calcium carbonate (TUMS EX) 750 MG chewable tablet Chew 1 tablet by mouth daily as needed.      . ergocalciferol (VITAMIN D2) 50000 UNITS capsule Take 50,000 Units by mouth every 30 (thirty) days.       . fluocinonide ointment (LIDEX) 0.05 % Apply 1 application topically 2 (two) times daily as needed. For psoriasis      . metoprolol tartrate (LOPRESSOR) 25 MG tablet Take 1 tablet (25 mg total) by mouth 2 (two) times daily.  60 tablet  1  . Probiotic Product (PROBIOTIC PO) Take 1 capsule by mouth daily.      . traMADol (ULTRAM) 50 MG tablet TAKE ONE TABLET BY MOUTH EVERY 6 HOURS AS NEEDED FOR PAIN  40 tablet  0     Physical Exam:  She looks well. There is no cervical or supraclavicular adenopathy. Lung exam is clear. Cardiac exam shows a regular rate and rhythm with normal heart sounds. The left thoracotomy incision is well-healed. There are no skin lesions. Abdominal exam shows active bowel sounds. Abdomen is soft and nontender. There are no palpable masses or organomegaly.  Diagnostic Tests:  *RADIOLOGY REPORT*     Clinical Data: A follow up appointment.  History of lung cancer on chemotherapy.   CHEST - 2 VIEW   Comparison: Chest x-ray 01/12/2012.   Findings: Postoperative changes of left lower lobectomy.  A small amount of chronic pleural scarring in the inferior aspect of the left hemithorax.  No acute consolidative airspace disease or definite pleural effusions.  No definite suspicious appearing pulmonary nodules or masses on this plain film examination.  Mild bilateral apical pleuroparenchymal thickening, most compatible with scarring (right greater than left), unchanged.  Pulmonary vasculature is normal.  Heart size is normal.  Mediastinal contours are unremarkable.   IMPRESSION: 1.  Status post left lower lobectomy with similar postoperative appearance of the thorax, as detailed above.  No definite evidence to suggest local recurrence or new metastatic disease in the lungs on this plain film examination.   Original Report Authenticated By: Florencia Reasons, M.D.   Impression:  She is doing well 4 months following left lower lobectomy. I told her she can return to normal activity without restriction.  Plan:  She will plan to followup with her oncologist in [place]. She said she is scheduled to have a CT scan of the chest in October. I told her she did not  need to return to see me as long as she is maintaining close followup with her oncologist. I will be happy to see her back if the need arises.

## 2012-04-25 ENCOUNTER — Ambulatory Visit: Payer: Self-pay | Admitting: Oncology

## 2012-07-10 ENCOUNTER — Ambulatory Visit: Payer: Self-pay | Admitting: Oncology

## 2012-07-15 ENCOUNTER — Ambulatory Visit: Payer: Self-pay | Admitting: Oncology

## 2012-07-16 LAB — CBC CANCER CENTER
Eosinophil #: 0.3 x10 3/mm (ref 0.0–0.7)
Lymphocyte %: 30.8 %
MCH: 29.4 pg (ref 26.0–34.0)
MCHC: 32.4 g/dL (ref 32.0–36.0)
Monocyte #: 0.4 x10 3/mm (ref 0.2–0.9)
Monocyte %: 7.7 %
Neutrophil #: 3 x10 3/mm (ref 1.4–6.5)
Neutrophil %: 55.5 %
Platelet: 244 x10 3/mm (ref 150–440)
RBC: 4.02 10*6/uL (ref 3.80–5.20)
RDW: 13.8 % (ref 11.5–14.5)

## 2012-07-16 LAB — COMPREHENSIVE METABOLIC PANEL
Albumin: 3.7 g/dL (ref 3.4–5.0)
Alkaline Phosphatase: 107 U/L (ref 50–136)
Anion Gap: 8 (ref 7–16)
BUN: 12 mg/dL (ref 7–18)
Calcium, Total: 9 mg/dL (ref 8.5–10.1)
Creatinine: 0.93 mg/dL (ref 0.60–1.30)
EGFR (African American): >60
EGFR (Non-African Amer.): >60
Glucose: 79 mg/dL (ref 65–99)
Potassium: 4.2 mmol/L (ref 3.5–5.1)
SGOT(AST): 25 U/L (ref 15–37)
Total Protein: 7.5 g/dL (ref 6.4–8.2)

## 2012-07-25 ENCOUNTER — Encounter: Payer: Self-pay | Admitting: Internal Medicine

## 2012-07-26 ENCOUNTER — Ambulatory Visit: Payer: Self-pay | Admitting: Oncology

## 2012-08-25 ENCOUNTER — Other Ambulatory Visit: Payer: Self-pay | Admitting: Internal Medicine

## 2012-09-03 ENCOUNTER — Ambulatory Visit: Payer: Self-pay | Admitting: General Surgery

## 2012-09-25 ENCOUNTER — Ambulatory Visit: Payer: Self-pay | Admitting: Oncology

## 2012-10-10 ENCOUNTER — Ambulatory Visit: Payer: Self-pay | Admitting: Oncology

## 2012-10-26 ENCOUNTER — Ambulatory Visit: Payer: Self-pay | Admitting: Oncology

## 2013-02-11 ENCOUNTER — Ambulatory Visit: Payer: Self-pay | Admitting: Oncology

## 2013-02-12 LAB — COMPREHENSIVE METABOLIC PANEL
Alkaline Phosphatase: 99 U/L (ref 50–136)
BUN: 14 mg/dL (ref 7–18)
Bilirubin,Total: 0.2 mg/dL (ref 0.2–1.0)
Chloride: 103 mmol/L (ref 98–107)
Creatinine: 1.01 mg/dL (ref 0.60–1.30)
EGFR (African American): >60
EGFR (Non-African Amer.): >60
Osmolality: 284 (ref 275–301)
Potassium: 4.2 mmol/L (ref 3.5–5.1)
SGOT(AST): 25 U/L (ref 15–37)
SGPT (ALT): 26 U/L (ref 12–78)
Sodium: 141 mmol/L (ref 136–145)

## 2013-02-12 LAB — CBC CANCER CENTER
Basophil %: 0.8 %
Eosinophil #: 0.4 x10 3/mm (ref 0.0–0.7)
Eosinophil %: 5.7 %
HGB: 12.6 g/dL (ref 12.0–16.0)
MCH: 30.4 pg (ref 26.0–34.0)
MCV: 89 fL (ref 80–100)
Monocyte #: 0.3 x10 3/mm (ref 0.2–0.9)
Monocyte %: 4 %
Neutrophil %: 63.3 %
RBC: 4.16 10*6/uL (ref 3.80–5.20)
RDW: 14.5 % (ref 11.5–14.5)

## 2013-02-23 ENCOUNTER — Ambulatory Visit: Payer: Self-pay | Admitting: Oncology

## 2013-04-25 ENCOUNTER — Ambulatory Visit: Payer: Self-pay | Admitting: Oncology

## 2013-05-12 ENCOUNTER — Ambulatory Visit: Payer: Self-pay | Admitting: Oncology

## 2013-05-26 ENCOUNTER — Ambulatory Visit: Payer: Self-pay | Admitting: Oncology

## 2013-06-26 ENCOUNTER — Ambulatory Visit (INDEPENDENT_AMBULATORY_CARE_PROVIDER_SITE_OTHER): Payer: 59 | Admitting: Internal Medicine

## 2013-06-26 ENCOUNTER — Encounter: Payer: Self-pay | Admitting: Internal Medicine

## 2013-06-26 VITALS — BP 160/80 | HR 87 | Temp 97.7°F | Wt 155.0 lb

## 2013-06-26 DIAGNOSIS — J209 Acute bronchitis, unspecified: Secondary | ICD-10-CM

## 2013-06-26 MED ORDER — HYDROCODONE-HOMATROPINE 5-1.5 MG/5ML PO SYRP: 5.0000 mL | ORAL_SOLUTION | Freq: Every evening | ORAL | Status: DC | PRN

## 2013-06-26 MED ORDER — DOXYCYCLINE MONOHYDRATE 100 MG PO TABS: 100.0000 mg | ORAL_TABLET | Freq: Two times a day (BID) | ORAL | Status: DC

## 2013-06-26 NOTE — Progress Notes (Signed)
Subjective:    Patient ID: Regina Grimes, female    DOB: 1951-09-12, 62 y.o.   MRN: [id number]  HPI Had lung cancer Excision then adjuvant chemo at [facility] Now goes every 6 months for follow up  CT last in August--looked okay  Has congestion --especially at night when lying down Rattle in chest Started ~3 weeks ago. Tried allegra D---only brief help  Green mucus at first---now more white No fever No shortness of breath Occasionally hot at night but no sweats, etc  Current Outpatient Prescriptions on File Prior to Visit  Medication Sig Dispense Refill  . fluocinonide ointment (LIDEX) 0.05 % Apply 1 application topically 2 (two) times daily as needed. For psoriasis       No current facility-administered medications on file prior to visit.    Allergies  Allergen Reactions  . Aspirin     REACTION: stomach burning  . Codeine     Upset stomach  . Pravastatin Sodium     REACTION: mood changes and snapping at everyone    Past Medical History  Diagnosis Date  . Psoriasis     has used methotrexate since she was 62y.o., when psoriasis flares. Currently methotrexate on hold since 11/05/2011   . Hyperlipidemia   . Osteopenia   . GERD (gastroesophageal reflux disease)   . Arthritis     arm  . Hiatal hernia   . Hypertension     treated for while in hosp. 10/2011, given med. , unsure of name of med., not told to continue   . Coughing up blood     10/2011, admitted to [facility]   . Recurrent upper respiratory infection (URI)     lung infection, treated /w antibiotic - 10/2011  . Cancer     lung cancer    Past Surgical History  Procedure Laterality Date  . Colonoscopy  8/02  . Esophagogastroduodenoscopy  8/02  . Tubal ligation  1982  . Benign tumors of left foot  1992/1994  . Vd      x 2  . Abnormal pap      low grade intrepithelial lesion. Colposcipy okay  . Video bronchoscopy  11/16/2011    Procedure: VIDEO BRONCHOSCOPY WITH FLUORO;  Surgeon: Comer Locket. Vassie Loll, MD;   Location: [facility];  Service: Cardiopulmonary;  Laterality: Bilateral;  . Foot neuroma surgery      L foot- 1990's  . Appendectomy      1982- along /w tubal ligation   . Video bronchoscopy  12/21/2011    Procedure: VIDEO BRONCHOSCOPY;  Surgeon: Alleen Borne, MD;  Location: [facility] OR;  Service: Thoracic;  Laterality: N/A;    Family History  Problem Relation Age of Onset  . Coronary artery disease Mother   . Lung cancer Father   . Breast cancer Paternal Aunt   . Cirrhosis Maternal Grandmother   . Anesthesia problems Neg Hx     History   Social History  . Marital Status: Married    Spouse Name: N/A    Number of Children: N/A  . Years of Education: N/A   Occupational History  . [employer]   Social History Main Topics  . Smoking status: Former Smoker -- 1.00 packs/day for 25 years    Types: Cigarettes    Quit date: 06/25/2010  . Smokeless tobacco: Never Used  . Alcohol Use: No  . Drug Use: No  . Sexual Activity: Not on file   Other Topics Concern  .  Not on file   Social History Narrative  . No narrative on file   Review of Systems Negative breast biopsy also    Objective:   Physical Exam  Constitutional: She appears well-developed and well-nourished. No distress.  HENT:  Mouth/Throat: Oropharynx is clear and moist. No oropharyngeal exudate.  No sinus tenderness Moderate nasal congestion and inflammation TMs fine  Neck: Normal range of motion. Neck supple.  Pulmonary/Chest: Effort normal and breath sounds normal. No respiratory distress. She has no wheezes. She has no rales.  Lymphadenopathy:    She has no cervical adenopathy.          Assessment & Plan:

## 2013-06-26 NOTE — Assessment & Plan Note (Signed)
Going back for 3 weeks Cough keeping her up  Will try doxy Cough med

## 2013-07-22 ENCOUNTER — Ambulatory Visit: Payer: Self-pay | Admitting: Oncology

## 2013-09-05 IMAGING — CT CT CHEST W/O CM
1 series · 16 of 30 positions shown, 20 images · non-contrast
Comparison: none

REASON FOR EXAM: lt chest pain
COMMENTS:

PROCEDURE:     CT  - CT CHEST WITHOUT CONTRAST  - November 09, 2011 [DATE]
RESULT:
HISTORY: Chest pain.

[Series 2: soft tissue · axial · 0.65mm/px · z∈[-920,-790]mm · 16 of 30 slices shown, 20 images]
[im 3/30  soft-tissue]
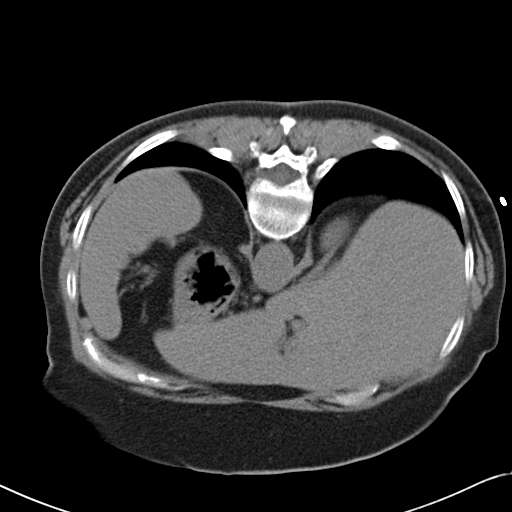
[im 3/30  bone]
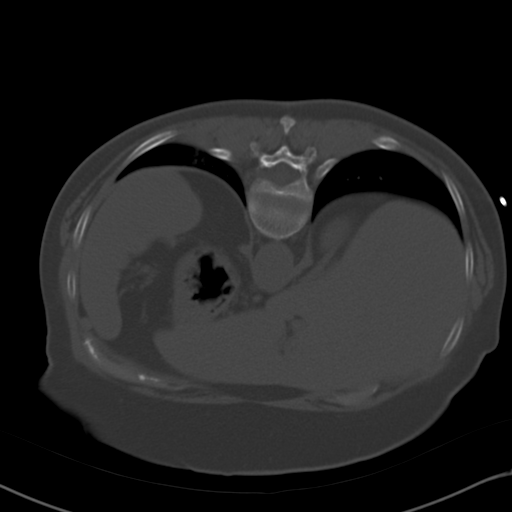
[im 5/30  soft-tissue]
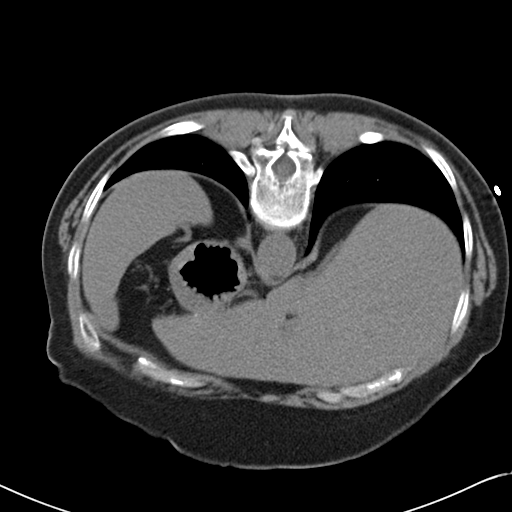
[im 7/30  soft-tissue]
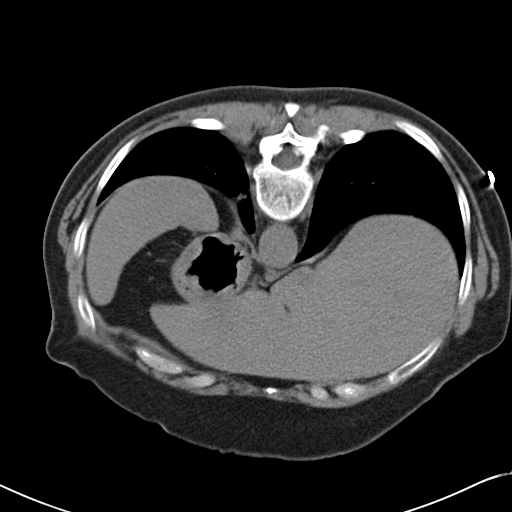
[im 9/30  soft-tissue]
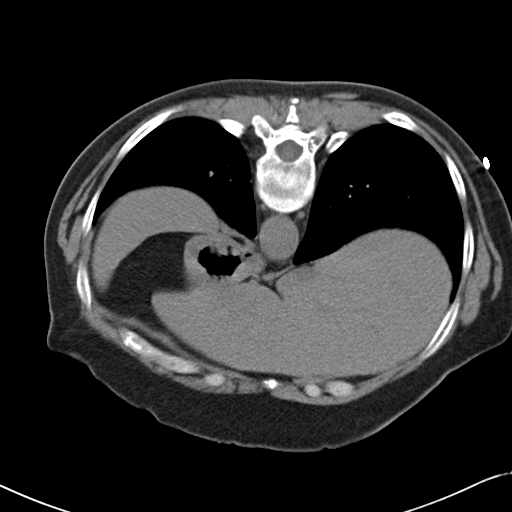
[im 11/30  soft-tissue]
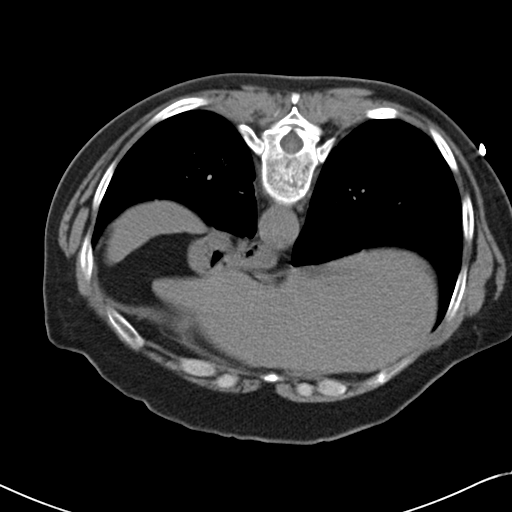
[im 13/30  soft-tissue]
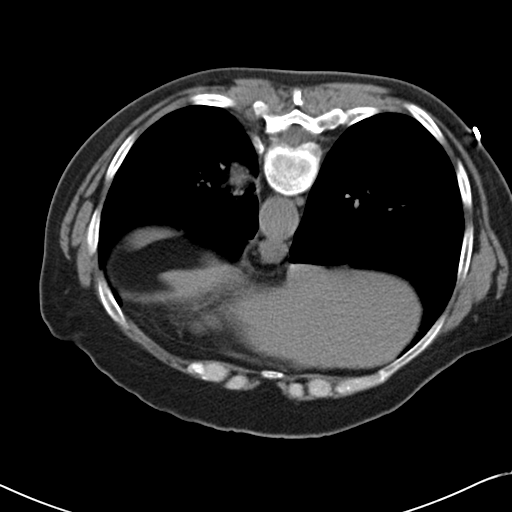
[im 15/30  soft-tissue]
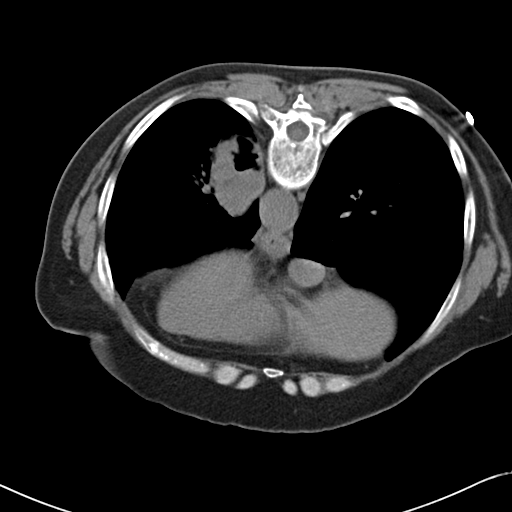
[im 17/30  soft-tissue]
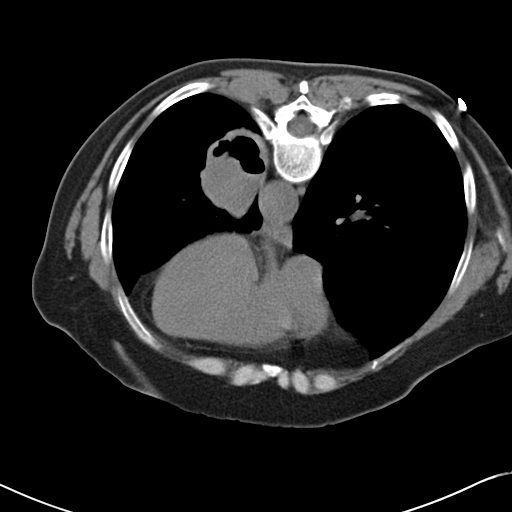
[im 19/30  soft-tissue]
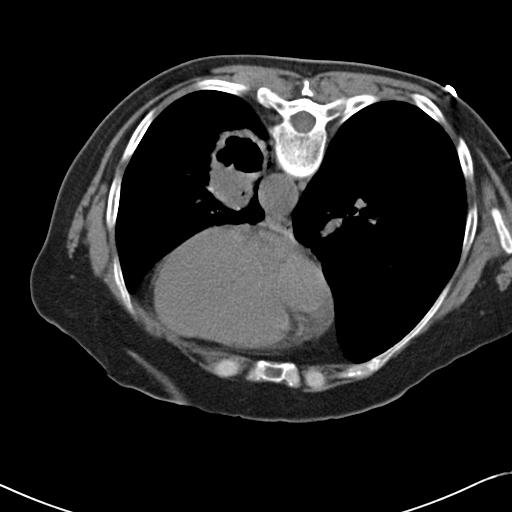
[im 19/30  bone]
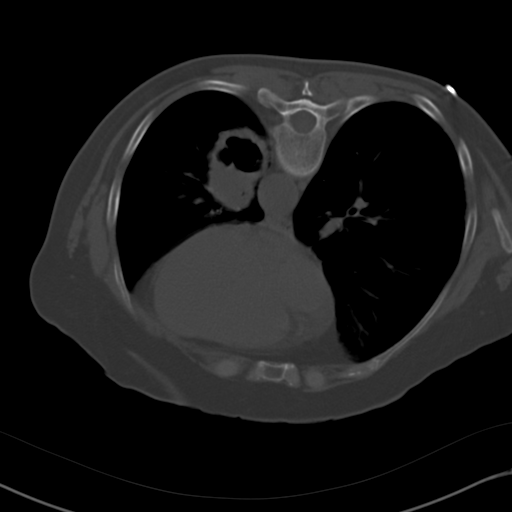
[im 21/30  soft-tissue]
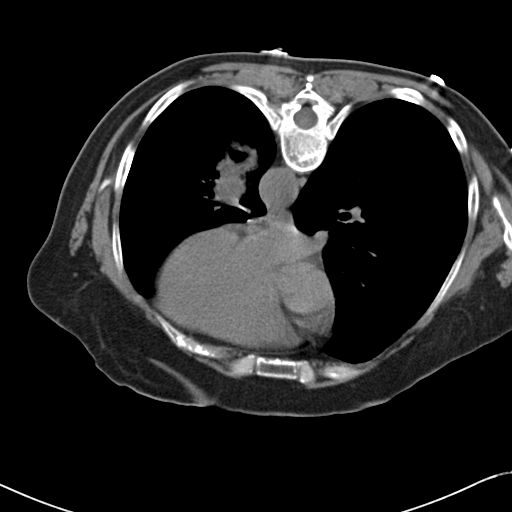
[im 23/30  soft-tissue]
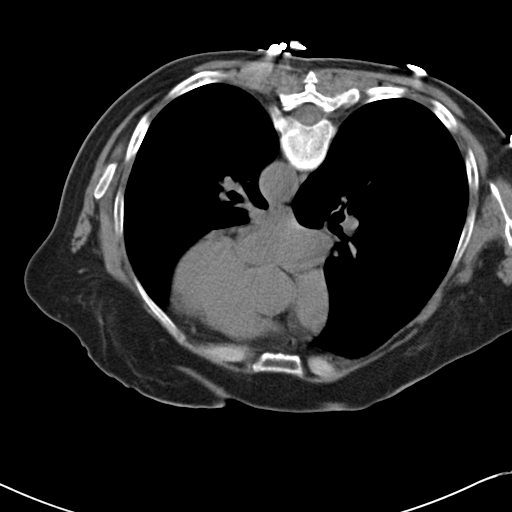
[im 25/30  soft-tissue]
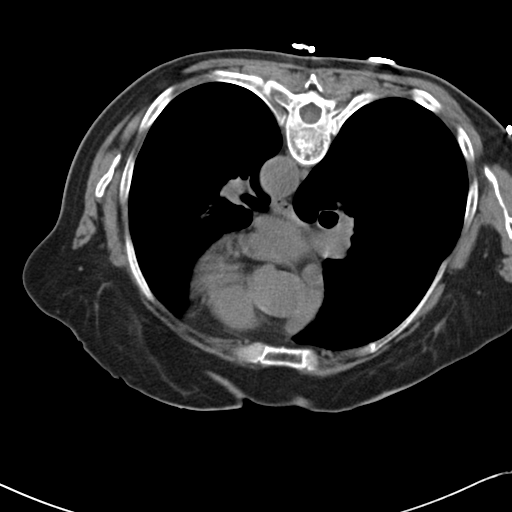
[im 26/30  lung]
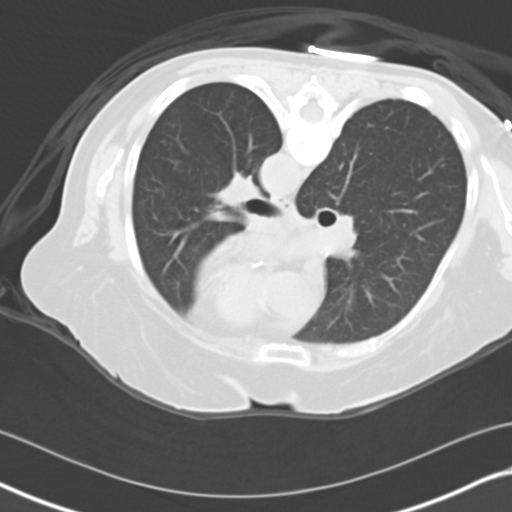
[im 27/30  soft-tissue]
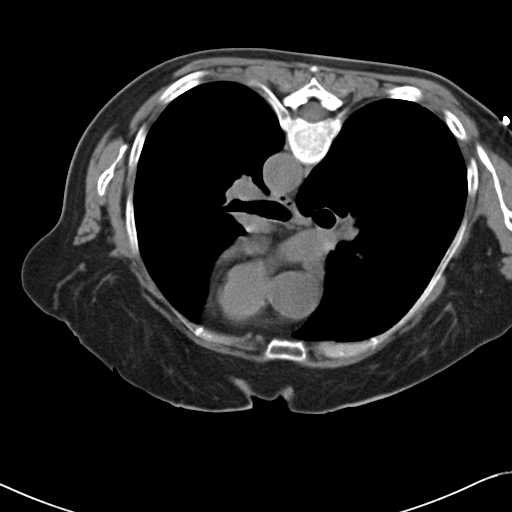
[im 27/30  lung]
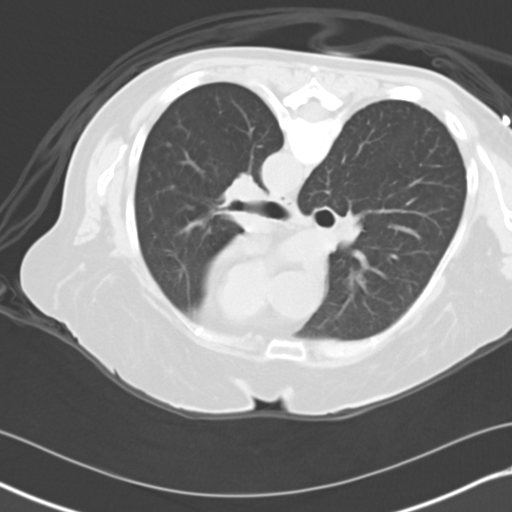
[im 28/30  lung]
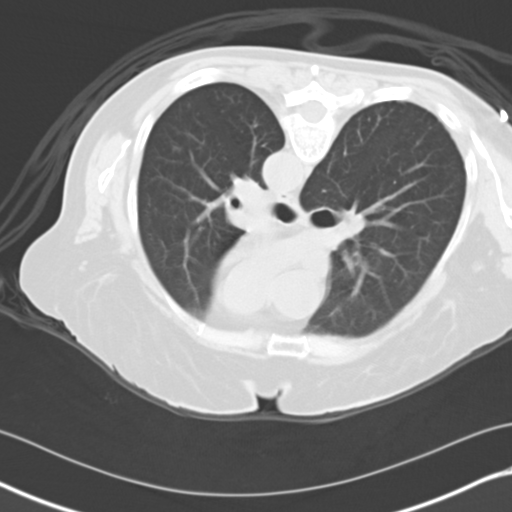
[im 29/30  soft-tissue]
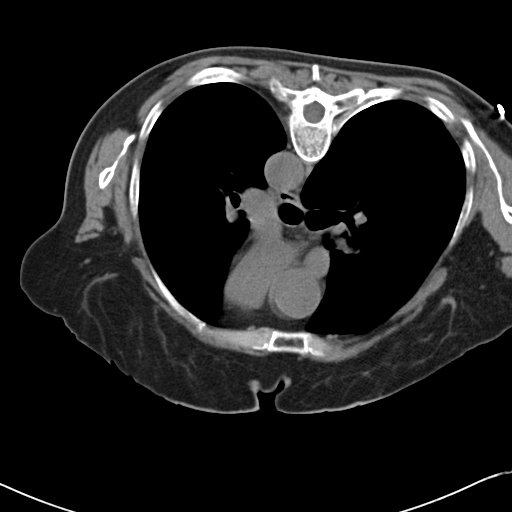
[im 29/30  lung]
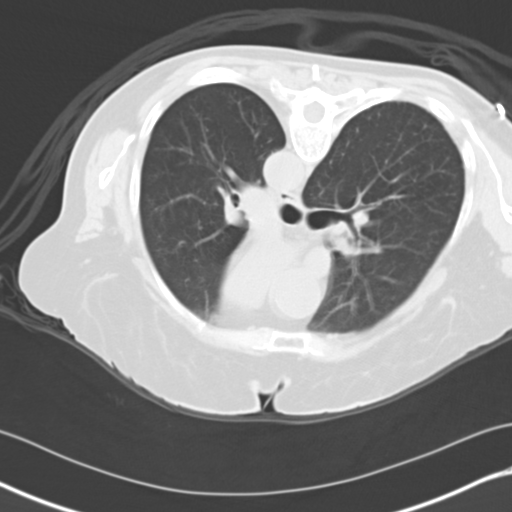

[16 of 30 positions shown; findings below may reference images not displayed]

PROCEDURE AND FINDINGS:  Prone CT of the chest was obtained prior to
possible biopsy of left lower lobe mass lesion. The previously identified
left lower lobe mass lesion has diminished slightly in size and cavitation
has increased. These findings suggest that this is an infectious cavity.
These findings were discussed with the patient and the patient's husband.
These findings were also discussed with Dr. Reyes Jr. It was elected not to
perform biopsy at this time and to follow this lesion for resolution. If
this lesion does not resolve, biopsy can be performed.
IMPRESSION: Partial resolution of cavitary lung mass. Malignancy still
cannot be excluded. Follow-up imaging is suggested to demonstrate
resolution. Bronchoscopy may also prove useful for further evaluation as
this may be an infectious process.

## 2013-10-14 ENCOUNTER — Encounter: Payer: Self-pay | Admitting: Family Medicine

## 2013-10-14 ENCOUNTER — Ambulatory Visit (INDEPENDENT_AMBULATORY_CARE_PROVIDER_SITE_OTHER): Payer: 59 | Admitting: Family Medicine

## 2013-10-14 ENCOUNTER — Ambulatory Visit (INDEPENDENT_AMBULATORY_CARE_PROVIDER_SITE_OTHER)
Admission: RE | Admit: 2013-10-14 | Discharge: 2013-10-14 | Disposition: A | Discharge status: Home / Self Care | Payer: 59 | Source: Ambulatory Visit | Attending: Family Medicine | Admitting: Family Medicine

## 2013-10-14 VITALS — BP 138/90 | HR 72 | Temp 97.9°F | Ht 65.0 in | Wt 161.5 lb

## 2013-10-14 DIAGNOSIS — J069 Acute upper respiratory infection, unspecified: Secondary | ICD-10-CM

## 2013-10-14 DIAGNOSIS — M546 Pain in thoracic spine: Secondary | ICD-10-CM

## 2013-10-14 NOTE — Progress Notes (Signed)
Pre-visit discussion using our clinic review tool. No additional management support is needed unless otherwise documented below in the visit note.  

## 2013-10-14 NOTE — Patient Instructions (Signed)
Xray today I think you have a viral upper respiratory infection - rest and get lots of fluids Update [place] if fever returns or severe cough For the back pain - use heat , massage/ aleve with food if it helps and the stretch we reviewed

## 2013-10-14 NOTE — Progress Notes (Signed)
Subjective:    Patient ID: Regina Grimes, female    DOB: Feb 03, 1951, 63 y.o.   MRN: [id number]  HPI Friday- pt developed body aches  100.6 fever and chills  Now having pain next to her R shoulder blade  Aleve helps with pain  Worse to abduct her arm   Has felt like she has run a low grade fever -no chills however  Got flu shot in oct   Some nasal congestion - blowing nose  Very little cough Not productive  Ears and throat are ok  This feels better now - and no more fever at all   Has had surgery for lung cancer in the past  LLL removed  Took 4 tx of chemo    Patient Active Problem List   Diagnosis Date Noted  . Acute bronchitis 06/26/2013  . Lung cancer 11/24/2011  . BLOOD IN STOOL 11/02/2010  . GERD 10/27/2010  . OSTEOPENIA 10/27/2010  . HYPERLIPIDEMIA 02/04/2007  . PSORIASIS 01/22/2007   Past Medical History  Diagnosis Date  . Psoriasis     has used methotrexate since she was 63y.o., when psoriasis flares. Currently methotrexate on hold since 11/05/2011   . Hyperlipidemia   . Osteopenia   . GERD (gastroesophageal reflux disease)   . Arthritis     arm  . Hiatal hernia   . Hypertension     treated for while in hosp. 10/2011, given med. , unsure of name of med., not told to continue   . Coughing up blood     10/2011, admitted to [facility]   . Recurrent upper respiratory infection (URI)     lung infection, treated /w antibiotic - 10/2011  . Cancer     lung cancer   Past Surgical History  Procedure Laterality Date  . Colonoscopy  8/02  . Esophagogastroduodenoscopy  8/02  . Tubal ligation  1982  . Benign tumors of left foot  1992/1994  . Vd      x 2  . Abnormal pap      low grade intrepithelial lesion. Colposcipy okay  . Video bronchoscopy  11/16/2011    Procedure: VIDEO BRONCHOSCOPY WITH FLUORO;  Surgeon: Leanna Sato. Elsworth Soho, MD;  Location: [facility];  Service: Cardiopulmonary;  Laterality: Bilateral;  . Foot neuroma surgery      L foot- 1990's  .  Appendectomy      1982- along /w tubal ligation   . Video bronchoscopy  12/21/2011    Procedure: VIDEO BRONCHOSCOPY;  Surgeon: Gaye Pollack, MD;  Location: [facility] OR;  Service: Thoracic;  Laterality: N/A;   History  Substance Use Topics  . Smoking status: Former Smoker -- 1.00 packs/day for 25 years    Types: Cigarettes    Quit date: 06/25/2010  . Smokeless tobacco: Never Used  . Alcohol Use: No   Family History  Problem Relation Age of Onset  . Coronary artery disease Mother   . Lung cancer Father   . Breast cancer Paternal Aunt   . Cirrhosis Maternal Grandmother   . Anesthesia problems Neg Hx    Allergies  Allergen Reactions  . Aspirin     REACTION: stomach burning  . Codeine     Upset stomach  . Pravastatin Sodium     REACTION: mood changes and snapping at everyone   Current Outpatient Prescriptions on File Prior to Visit  Medication Sig Dispense Refill  . esomeprazole (NEXIUM) 40 MG capsule Take 40 mg by mouth daily before breakfast.      .  fluocinonide ointment (LIDEX) 9.62 % Apply 1 application topically 2 (two) times daily as needed. For psoriasis       No current facility-administered medications on file prior to visit.     Review of Systems Review of Systems  Constitutional: Negative for fever, appetite change, fatigue and unexpected weight change.  Eyes: Negative for pain and visual disturbance.  ENT pos for improved cong and rhinorrhea / neg for facial pain  Respiratory: Negative for wheeze and shortness of breath.   Cardiovascular: Negative for cp or palpitations    Gastrointestinal: Negative for nausea, diarrhea and constipation.  Genitourinary: Negative for urgency and frequency.  Skin: Negative for pallor or rash   MSK pos for back pain / positional Neurological: Negative for weakness, light-headedness, numbness and headaches.  Hematological: Negative for adenopathy. Does not bruise/bleed easily.  Psychiatric/Behavioral: Negative for dysphoric mood. The  patient is not nervous/anxious.         Objective:   Physical Exam  Constitutional: She appears well-developed and well-nourished.  Well appearing   HENT:  Head: Normocephalic and atraumatic.  Right Ear: External ear normal.  Left Ear: External ear normal.  Mouth/Throat: Oropharynx is clear and moist. No oropharyngeal exudate.  Nares are injected and boggy No sinus tenderness Throat clear   Eyes: Conjunctivae and EOM are normal. Pupils are equal, round, and reactive to light. Right eye exhibits no discharge. Left eye exhibits no discharge. No scleral icterus.  Neck: Normal range of motion. Neck supple.  Cardiovascular: Normal rate and regular rhythm.   Pulmonary/Chest: Effort normal and breath sounds normal. No respiratory distress. She has no wheezes. She has no rales.  Dec bx in L lower lung  No rales/ rhonchi or wheeze Good air exch  Musculoskeletal: Normal range of motion. She exhibits tenderness. She exhibits no edema.  Tender in musculature medial to R scapula  Triggered by rhomboid stretch done in office No cw/ rib pain  No TS bony tenderness  Lymphadenopathy:    She has no cervical adenopathy.  Neurological: She is alert. She has normal reflexes.  Skin: Skin is warm and dry. No rash noted. No erythema.  Psychiatric: She has a normal mood and affect.          Assessment & Plan:

## 2013-10-15 NOTE — Assessment & Plan Note (Signed)
This seems to be improving  Disc symptomatic care - see instructions on AVS  Update if not starting to improve in a week or if worsening

## 2013-10-15 NOTE — Assessment & Plan Note (Signed)
Suspect rhomboid strain on the R  Imp with rhomboid stretch Disc symptomatic care - see instructions on AVS -nsaid/ stretch/heat Update if not starting to improve in a week or if worsening    cxr today in light of hx of lung ca and also uri symptoms

## 2013-10-19 IMAGING — CR DG CHEST 1V PORT
1 series · 1 of 1 positions shown · non-contrast
Comparison: the previous day's study

CLINICAL DATA: Left thoracotomy and lobectomy.

PORTABLE CHEST - 1 VIEW

[view not recorded]
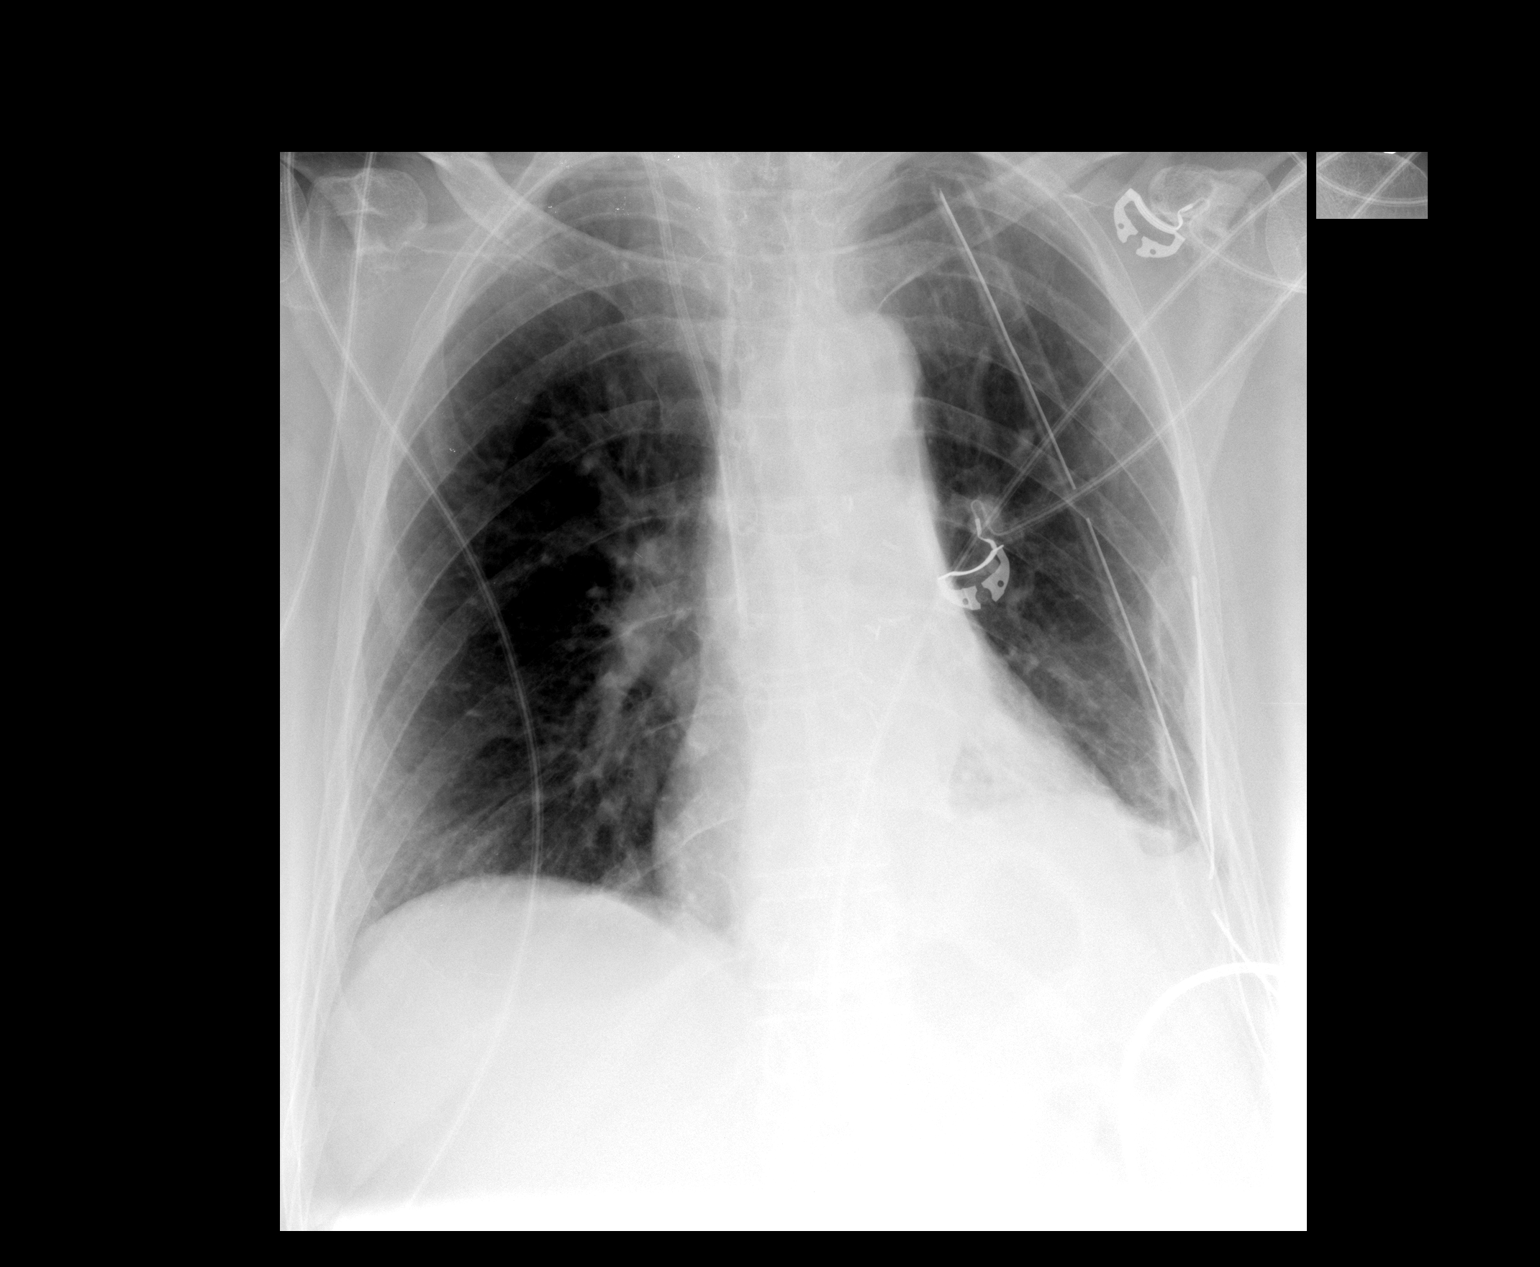

[1 of 1 positions shown; findings below may reference images not displayed]

FINDINGS: Right IJ central line and 2 left chest tubes remain in
place.  No definite pneumothorax.  Heart size normal.  Patchy
atelectasis or infiltrate at the left lung base stable.  Right lung
clear.
IMPRESSION: 1.  Little change in the left lower lobe atelectasis/infiltrate.
2. Support hardware stable in position.  No pneumothorax.

## 2013-10-20 IMAGING — CR DG CHEST 1V PORT
1 series · 1 of 1 positions shown · non-contrast
Comparison: the previous day's study

CLINICAL DATA: Chest tubes

PORTABLE CHEST - 1 VIEW

[AP]
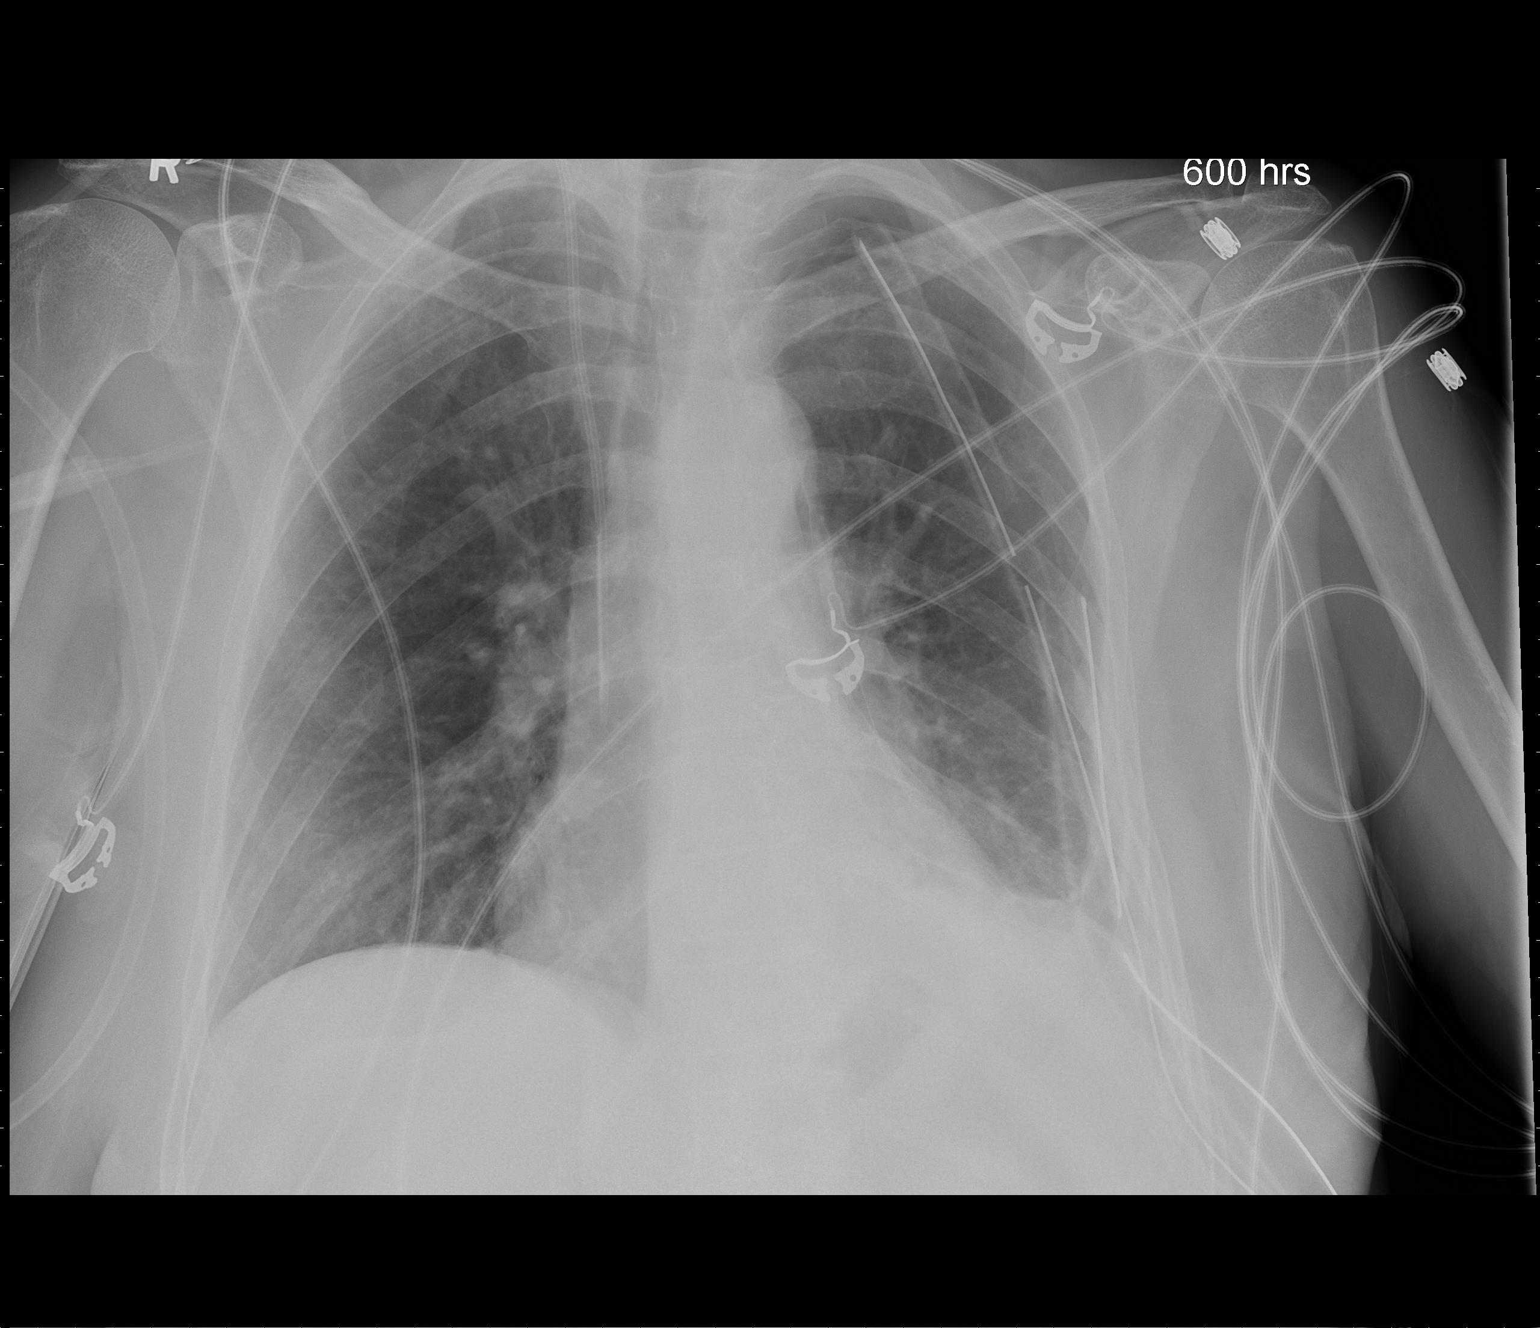

[1 of 1 positions shown; findings below may reference images not displayed]

FINDINGS: Two left chest tubes remain in place with a slight
increase in a relatively small apical pneumothorax extending down
to the posterior aspect of the third rib.  Right IJ central line
stable.  Patchy atelectasis at the left lung base.
IMPRESSION: 1.  Slight increase in size of small   left apical pneumothorax
with stable left chest tubes.

## 2013-10-30 ENCOUNTER — Telehealth: Payer: Self-pay

## 2013-10-30 NOTE — Telephone Encounter (Signed)
Pt left v/m requesting chest xray report faxed to Dr Santo Held verified  fax # [phone]. Done.

## 2013-11-12 ENCOUNTER — Ambulatory Visit: Payer: Self-pay | Admitting: Oncology

## 2013-11-23 ENCOUNTER — Ambulatory Visit: Payer: Self-pay | Admitting: Oncology

## 2014-01-08 ENCOUNTER — Encounter: Payer: 59 | Admitting: Internal Medicine

## 2014-01-29 ENCOUNTER — Encounter: Payer: 59 | Admitting: Internal Medicine

## 2014-04-25 ENCOUNTER — Ambulatory Visit: Payer: Self-pay | Admitting: Family Medicine

## 2014-05-20 ENCOUNTER — Ambulatory Visit: Payer: Self-pay | Admitting: Oncology

## 2014-05-26 ENCOUNTER — Ambulatory Visit: Payer: Self-pay | Admitting: Oncology

## 2014-07-13 ENCOUNTER — Ambulatory Visit (INDEPENDENT_AMBULATORY_CARE_PROVIDER_SITE_OTHER): Payer: 59 | Admitting: Internal Medicine

## 2014-07-13 ENCOUNTER — Encounter: Payer: Self-pay | Admitting: Internal Medicine

## 2014-07-13 VITALS — BP 138/80 | HR 77 | Temp 97.8°F | Ht 65.0 in | Wt 159.0 lb

## 2014-07-13 DIAGNOSIS — E785 Hyperlipidemia, unspecified: Secondary | ICD-10-CM

## 2014-07-13 DIAGNOSIS — Z0000 Encounter for general adult medical examination without abnormal findings: Secondary | ICD-10-CM | POA: Insufficient documentation

## 2014-07-13 DIAGNOSIS — K219 Gastro-esophageal reflux disease without esophagitis: Secondary | ICD-10-CM

## 2014-07-13 DIAGNOSIS — L409 Psoriasis, unspecified: Secondary | ICD-10-CM

## 2014-07-13 NOTE — Patient Instructions (Signed)
Please schedule your screening mammogram.

## 2014-07-13 NOTE — Progress Notes (Signed)
Subjective:    Patient ID: Regina Grimes, female    DOB: Jun 19, 1951, 63 y.o.   MRN: [id number]  HPI Here for physical  No recurrence of cancer Doesn't have much stamina Gets SOB easy even walking the dog Discussed working on fitness  Gained lots of weight with the steroids with chemo This helped psoriasis which is now worsening again Wonders about going back on MTX I asked her to clear this with Dr Oliva Bustard  Has stopped the nexium Now on tagamet daily--not sure of dose This controls reflux and no swallowing problems  Current Outpatient Prescriptions on File Prior to Visit  Medication Sig Dispense Refill  . fluocinonide ointment (LIDEX) 7.89 % Apply 1 application topically 2 (two) times daily as needed. For psoriasis       No current facility-administered medications on file prior to visit.    Allergies  Allergen Reactions  . Aspirin     REACTION: stomach burning  . Codeine     Upset stomach  . Pravastatin Sodium     REACTION: mood changes and snapping at everyone    Past Medical History  Diagnosis Date  . Psoriasis     has used methotrexate since she was 63y.o., when psoriasis flares. Currently methotrexate on hold since 11/05/2011   . Hyperlipidemia   . Osteopenia   . GERD (gastroesophageal reflux disease)   . Arthritis     arm  . Hiatal hernia   . Hypertension     treated for while in hosp. 10/2011, given med. , unsure of name of med., not told to continue   . Coughing up blood     10/2011, admitted to [facility]   . Recurrent upper respiratory infection (URI)     lung infection, treated /w antibiotic - 10/2011  . Cancer     lung cancer    Past Surgical History  Procedure Laterality Date  . Colonoscopy  8/02  . Esophagogastroduodenoscopy  8/02  . Tubal ligation  1982  . Benign tumors of left foot  1992/1994  . Vd      x 2  . Abnormal pap      low grade intrepithelial lesion. Colposcipy okay  . Video bronchoscopy  11/16/2011    Procedure: VIDEO  BRONCHOSCOPY WITH FLUORO;  Surgeon: Leanna Sato. Elsworth Soho, MD;  Location: [facility];  Service: Cardiopulmonary;  Laterality: Bilateral;  . Foot neuroma surgery      L foot- 1990's  . Appendectomy      1982- along /w tubal ligation   . Video bronchoscopy  12/21/2011    Procedure: VIDEO BRONCHOSCOPY;  Surgeon: Gaye Pollack, MD;  Location: [facility] OR;  Service: Thoracic;  Laterality: N/A;    Family History  Problem Relation Age of Onset  . Coronary artery disease Mother   . Lung cancer Father   . Breast cancer Paternal Aunt   . Cirrhosis Maternal Grandmother   . Anesthesia problems Neg Hx     History   Social History  . Marital Status: Married    Spouse Name: N/A    Number of Children: N/A  . Years of Education: N/A   Occupational History  . Lab [employer]   Social History Main Topics  . Smoking status: Former Smoker -- 1.00 packs/day for 25 years    Types: Cigarettes    Quit date: 06/25/2010  . Smokeless tobacco: Never Used  . Alcohol Use: No  . Drug Use: No  . Sexual  Activity: Not on file   Other Topics Concern  . Not on file   Social History Narrative  . No narrative on file   Review of Systems  Constitutional: Positive for fatigue and unexpected weight change.       Wears seat belt Takes centrum vitamin now  HENT: Positive for tinnitus. Negative for hearing loss and trouble swallowing.        Left ear tinnitus with chemo--rare now Full dentures  Eyes: Negative for visual disturbance.       No diplopia or unilateral vision loss  Respiratory: Positive for shortness of breath. Negative for cough and chest tightness.        Gets DOE  Cardiovascular: Negative for chest pain, palpitations and leg swelling.  Gastrointestinal: Positive for constipation. Negative for nausea, vomiting, abdominal pain and blood in stool.       Uses correctol prn with success  Endocrine: Negative for polydipsia and polyuria.  Genitourinary: Negative for dysuria, hematuria,  difficulty urinating and dyspareunia.  Musculoskeletal: Positive for arthralgias. Negative for back pain and joint swelling.       Occ wrist pain--relates to being on computer all day Aleve or tylenol help  Skin: Positive for rash.  Allergic/Immunologic: Positive for environmental allergies. Negative for immunocompromised state.       Mild symptoms--rare sudafed  Neurological: Negative for dizziness, syncope, weakness, light-headedness, numbness and headaches.  Hematological: Negative for adenopathy. Does not bruise/bleed easily.  Psychiatric/Behavioral: Positive for sleep disturbance.       Occ bad nights--sleeps okay in general       Objective:   Physical Exam  Constitutional: She is oriented to person, place, and time. She appears well-developed and well-nourished. No distress.  HENT:  Head: Normocephalic and atraumatic.  Right Ear: External ear normal.  Left Ear: External ear normal.  Mouth/Throat: Oropharynx is clear and moist. No oropharyngeal exudate.  Eyes: Conjunctivae and EOM are normal. Pupils are equal, round, and reactive to light.  Neck: Normal range of motion. Neck supple. No thyromegaly present.  Cardiovascular: Normal rate, regular rhythm, normal heart sounds and intact distal pulses.  Exam reveals no gallop.   No murmur heard. Pulmonary/Chest: Effort normal and breath sounds normal. No respiratory distress. She has no wheezes. She has no rales.  Abdominal: Soft. There is no tenderness.  Genitourinary:  No breast masses or tenderness  Musculoskeletal: She exhibits no edema and no tenderness.  Lymphadenopathy:    She has no cervical adenopathy.  Neurological: She is alert and oriented to person, place, and time.  Skin: No erythema.  Psoriatic rash worst on elbows  Psychiatric: She has a normal mood and affect. Her behavior is normal.          Assessment & Plan:

## 2014-07-13 NOTE — Assessment & Plan Note (Signed)
Healthy Needs to work on fitness Pneumovax in a few weeks and zostavax Due for screening mammogram Pap done today

## 2014-07-13 NOTE — Assessment & Plan Note (Signed)
If okay with Dr Oliva Bustard, I will restart the methotrexate (15mg  weekly)

## 2014-07-13 NOTE — Assessment & Plan Note (Signed)
Follows regularly with Dr Oliva Bustard

## 2014-07-13 NOTE — Assessment & Plan Note (Signed)
Controlled with cimetidine OTC

## 2014-07-13 NOTE — Addendum Note (Signed)
Addended by: Despina Hidden on: 07/13/2014 04:39 PM   Modules accepted: Orders

## 2014-07-13 NOTE — Assessment & Plan Note (Signed)
Will recheck  No statin for now

## 2014-07-15 LAB — COMPREHENSIVE METABOLIC PANEL
A/G RATIO: 1.6 (ref 1.1–2.5)
ALBUMIN: 4.3 g/dL (ref 3.6–4.8)
ALK PHOS: 89 IU/L (ref 39–117)
ALT: 20 IU/L (ref 0–32)
AST: 23 IU/L (ref 0–40)
BILIRUBIN TOTAL: 0.2 mg/dL (ref 0.0–1.2)
BUN / CREAT RATIO: 20 (ref 11–26)
BUN: 14 mg/dL (ref 8–27)
CHLORIDE: 100 mmol/L (ref 97–108)
CO2: 27 mmol/L (ref 18–29)
Calcium: 9.1 mg/dL (ref 8.7–10.3)
Creatinine, Ser: 0.71 mg/dL (ref 0.57–1.00)
GFR calc non Af Amer: 92 mL/min/{1.73_m2} (ref >59)
GFR, EST AFRICAN AMERICAN: 106 mL/min/{1.73_m2} (ref >59)
Globulin, Total: 2.7 g/dL (ref 1.5–4.5)
Glucose: 84 mg/dL (ref 65–99)
Potassium: 4.6 mmol/L (ref 3.5–5.2)
Sodium: 141 mmol/L (ref 134–144)
Total Protein: 7 g/dL (ref 6.0–8.5)

## 2014-07-15 LAB — CBC WITH DIFFERENTIAL/PLATELET
BASOS ABS: 0 10*3/uL (ref 0.0–0.2)
Basos: 0 %
EOS ABS: 0.4 10*3/uL (ref 0.0–0.4)
EOS: 5 %
HCT: 36.5 % (ref 34.0–46.6)
Hemoglobin: 11.9 g/dL (ref 11.1–15.9)
IMMATURE GRANS (ABS): 0 10*3/uL (ref 0.0–0.1)
IMMATURE GRANULOCYTES: 0 %
Lymphocytes Absolute: 2.1 10*3/uL (ref 0.7–3.1)
Lymphs: 28 %
MCH: 28.8 pg (ref 26.6–33.0)
MCHC: 32.6 g/dL (ref 31.5–35.7)
MCV: 88 fL (ref 79–97)
MONOS ABS: 0.6 10*3/uL (ref 0.1–0.9)
Monocytes: 8 %
NEUTROS PCT: 59 %
Neutrophils Absolute: 4.5 10*3/uL (ref 1.4–7.0)
RBC: 4.13 x10E6/uL (ref 3.77–5.28)
RDW: 15.6 % — AB (ref 12.3–15.4)
WBC: 7.7 10*3/uL (ref 3.4–10.8)

## 2014-07-15 LAB — LIPID PANEL
CHOL/HDL RATIO: 8.7 ratio — AB (ref 0.0–4.4)
Cholesterol, Total: 236 mg/dL — ABNORMAL HIGH (ref 100–199)
HDL: 27 mg/dL — ABNORMAL LOW (ref >39)
Triglycerides: 495 mg/dL — ABNORMAL HIGH (ref 0–149)

## 2014-07-15 LAB — T4, FREE: Free T4: 1.04 ng/dL (ref 0.82–1.77)

## 2014-07-16 LAB — PAP LB (LIQUID-BASED): PAP Smear Comment: 0

## 2014-07-22 ENCOUNTER — Encounter: Payer: Self-pay | Admitting: Internal Medicine

## 2014-07-22 MED ORDER — METHOTREXATE 2.5 MG PO TABS: 15.0000 mg | ORAL_TABLET | ORAL | Status: DC

## 2014-07-22 NOTE — Telephone Encounter (Signed)
Sent message back thru my-chart

## 2014-07-22 NOTE — Telephone Encounter (Signed)
Please let her know that I sent in the prescription for her Set her up for met C and CBC in about 3 months to be sure no problems with the methotrexate Make sure she is taking folic acid $RemoveBe'1mg'[id number]$  daily

## 2014-08-07 ENCOUNTER — Encounter: Payer: Self-pay | Admitting: Internal Medicine

## 2014-08-07 ENCOUNTER — Ambulatory Visit: Payer: Self-pay | Admitting: Internal Medicine

## 2014-08-13 ENCOUNTER — Telehealth: Payer: Self-pay | Admitting: Internal Medicine

## 2014-08-13 NOTE — Telephone Encounter (Signed)
Pt stated she wanted to get her pneumonia and shingles shot   The next available you have is 09/29/14.  She stated Dr Silvio Pate would want her to get it sooner.  Can she get both at same time and what date and time can i put her in

## 2014-08-17 NOTE — Telephone Encounter (Signed)
Spoke with pt and pt scheduled prevnar 13 08/27/14 at 3:30 pm and shingles vaccine 09/30/13 at 10 AM.

## 2014-08-21 NOTE — Telephone Encounter (Signed)
Pt calling stating that her insurance will not cover her prevnar vaccine because shes not 67? I advised that most insurances are covering the prevnar, she did state they will cover her shingles at 100%.

## 2014-08-22 NOTE — Telephone Encounter (Signed)
She would have to tell them that she is on methotrexate. I think the prevnar should be covered because she is on an immunosuppressant medication She can check again, but if they say no, just give her the zostavax. She should not get the zostavax after she has restarted the methotrexate though (since it is a live vaccine)

## 2014-08-24 NOTE — Telephone Encounter (Signed)
Patient advised.  Appt scheduled for Zostavax in January.

## 2014-08-24 NOTE — Telephone Encounter (Signed)
Pt said she will contact ins co about prevnar again; pt cancelled shingles vaccine appt at this time; pt has been taking methotrexate for 3 weeks; pt's last dose of methotrexate was 08/21/14. Pt wants to know how long would she need to be off methotrexate before she could take shingles vaccine and how soon after taking shingles vaccine would she need to wait before starting methotrexate again? Pt request cb.

## 2014-08-24 NOTE — Telephone Encounter (Signed)
Please let her know that I checked this out with a pharmacist and they can be given together but best that they are separated briefly. I would recommend she hold off on the methotrexate for a week before and after the zostavax---can send Rx if she needs it or give it here

## 2014-08-27 ENCOUNTER — Ambulatory Visit (INDEPENDENT_AMBULATORY_CARE_PROVIDER_SITE_OTHER): Payer: 59 | Admitting: *Deleted

## 2014-08-27 DIAGNOSIS — Z23 Encounter for immunization: Secondary | ICD-10-CM

## 2014-09-30 ENCOUNTER — Ambulatory Visit: Payer: 59 | Admitting: Internal Medicine

## 2014-09-30 ENCOUNTER — Ambulatory Visit: Payer: 59

## 2014-09-30 ENCOUNTER — Ambulatory Visit (INDEPENDENT_AMBULATORY_CARE_PROVIDER_SITE_OTHER): Payer: 59

## 2014-09-30 DIAGNOSIS — Z23 Encounter for immunization: Secondary | ICD-10-CM

## 2014-10-28 ENCOUNTER — Ambulatory Visit: Payer: 59 | Admitting: Internal Medicine

## 2014-11-02 ENCOUNTER — Ambulatory Visit: Payer: Self-pay | Admitting: Oncology

## 2014-11-24 ENCOUNTER — Ambulatory Visit
Admit: 2014-11-24 | Disposition: A | Discharge status: Home / Self Care | Payer: Self-pay | Attending: Oncology | Admitting: Oncology

## 2015-01-08 ENCOUNTER — Other Ambulatory Visit: Payer: Self-pay | Admitting: Oncology

## 2015-01-08 ENCOUNTER — Other Ambulatory Visit: Payer: Self-pay | Admitting: Specialist

## 2015-01-08 DIAGNOSIS — C3411 Malignant neoplasm of upper lobe, right bronchus or lung: Secondary | ICD-10-CM

## 2015-01-08 DIAGNOSIS — R0602 Shortness of breath: Secondary | ICD-10-CM

## 2015-01-08 DIAGNOSIS — R918 Other nonspecific abnormal finding of lung field: Secondary | ICD-10-CM

## 2015-01-17 NOTE — Consult Note (Signed)
PATIENT NAME:  Regina Grimes, Regina Grimes MR#:  [id number] DATE OF BIRTH:  06/06/1951  DATE OF CONSULTATION:  11/06/2011  REFERRING PHYSICIAN:  Dr. Doy Hutching  CONSULTING PHYSICIAN:  Herbon E. Raul Del, MD  PRIMARY CARE PHYSICIAN: Dr. Silvio Pate   REASON FOR CONSULTATION: Hemoptysis with a large cavitary masslike lesion.   HISTORY OF PRESENT ILLNESS: Regina Grimes is a pleasant 64 year old lady ex-smoker who is on methotrexate chronically for psoriasis, had been in her usual state of health up until about 3 or 4 days prior to admission when she developed hemoptysis or streaks of blood. Seemed to be worse when she lays down. She is not clear whether she may have had a little nosebleed with one of these episodes. This morning she also had streaks of blood but no blood subsequently. When I saw her husband is presently in the room. Because of the hemoptysis, low-grade fever and mild shortness of breath she came to the Emergency Room. On work-up she had a chest x-ray initially that showed a cavitary masslike lesion in the left lower lobe. Hence went on to a chest CT scan. Chest CT scan was done with contrast. There was a thick-walled cavitary mass medially in the left lower lobe. The mass was measuring about 5.3 cm x 3.9 cm x 5.6 cm this appeared inferior dimension. There was also a large left hilar lymph node. There were a few borderline enlarged axillary and porta hepatis lymph nodes. Emphysematous changes were also seen but no pleural or pericardial effusion noted. Patient is started on Zosyn and clindamycin. Sputum cultures apparently were being collected for AFB and culture. Blood cultures thus far are negative. Her initial white count on admission was 9.6, hemoglobin 12.6, platelet count 331. Her coags were also within normal limits, that is PT and INR. Presently not on any aspirin, Plavix or any other blood thinners. Did discuss with the patient the differential which include pneumonia, abscess versus malignancy. Copy of her  CT scan was also given. Patient denies any lymph nodes elsewhere nor is she bleeding anywhere. She has lost at least 28 pounds she says since October. She blames this on the fact that her mother was sick and she was traveling back and forth to the hospital and not eating as she should.   PAST MEDICAL HISTORY:  1. Psoriasis.  2. Status post appendectomy.  3. Status post bilateral tubal ligation.   MEDICATION: Methotrexate 15 mg q. week.   ALLERGIES: Codeine.   SOCIAL HISTORY: Ex-smoker but does not drink. Quit smoking approximately two years ago.   OCCUPATIONAL HISTORY: Worked primarily in office environment. No exposure to any fumes or chemicals.   FAMILY HISTORY: Noted for hypertension, otherwise unremarkable.   REVIEW OF SYSTEMS: Presently not having any fever, chills. No sweats. Appetite is poor to fair. No skin changes, rashes. No oral lesions or thrush. No neck pain, stiffness, or parotid gland enlargement. No pleurisy. No hemoptysis this evening. Did have specks of blood this morning. The amount of blood was not massive. Grossly estimation less than 40 mL. There is no ongoing chest pain, palpitations, significant or paroxysmal nocturnal dyspnea. Did have some chest pain two days ago when she took deep breath or move. No nausea, vomiting, diarrhea, blood in stool, dysuria, flank pain, rashes, polyphagia, polydipsia, heat or cold intolerance. No seizures, transient ischemic attacks, numbness, tingling, passing out spells. No suicide ideation. The rest of her review is noncontributory today.   PHYSICAL EXAMINATION:  VITAL SIGNS: Temperature 97.5, pulse 70, respirations 16,  blood pressure 110/70, oxygen saturation 97% on room air.   GENERAL: Small framed lady sitting in the bed erect, no apparent distress, did not cough while I was in the room.   HEENT: Normocephalic, nontraumatic. Extraocular movements are intact. No nystagmus. Oropharynx benign.   NECK: Supple. No jugular venous  distention, nodes, or thyromegaly.   CHEST: Bilateral breath sounds, moving air well, somewhat decreased, however, in the left lower chest.    CARDIAC: Regular. No obvious murmur. No peripheral signs of spontaneous bacterial endocarditis.   ABDOMEN: No palpable organomegaly or masses or ascites.   EXTREMITIES: No edema, cyanosis, Homans signs.   SKIN: Fair turgor. psoaritic changes vaguely seen on upper extremities and torso.   LYMPH: No nodes in neck or supraclavicular were palpable.   NEUROLOGICAL: No obvious focal deficits. Did not ambulate, however.   PSYCH: Appropriate affect. Alert and oriented.   MUSCULOSKELETAL: No joint effusions or tenderness.   LABORATORY, DIAGNOSTIC AND RADIOLOGICAL DATA: Glucose 97, BUN 8, creatinine 0.72, sodium 143, potassium 4.1, chloride 107, CO2 29, calcium 8.8, WBC 8.6, hemoglobin 11.4, hematocrit 33.5, platelets 274, MCV 90, neutrophils 67%, lymphocytes 21%. EKG showed normal sinus changes, no specific ST changes were also noted. Repeat chest x-ray today showed persistent cavitary mass left lower lobe.   IMPRESSION: This is a pleasant 64 year old lady, ex-smoker, who presents with what sounds like hemoptysis in the setting of a large cavitary mass with thick walls in the left lower lung. Differential as already suggested is consistent with pneumonia versus abscess, however, underlying malignancy cannot be ruled out. This becomes highly probable in a patient who does not have any significant fever, abnormal white count, weight loss and an ex-smoker certainly one has to raise that possibility high up on the differential. Hence, I agree with starting on Zosyn. I would consider adding Levaquin for community-acquired bacteria. Zosyn should cover aspiration organisms including anaerobic organisms. I have already discussed with radiology and depending on her response and what comes back on her sputum cultures as well as AFB stains if possible would move on to  transthoracic needle biopsy as early as Wednesday. Patient is in agreement to do so. Will reassess in the morning. Again, thank you for the referral.   ____________________________ Herbon E. Raul Del, MD hef:cms D: 11/07/2011 08:10:00 ET T: 11/07/2011 10:04:20 ET JOB#: [id number]  cc: Herbon E. Raul Del, MD, <Dictator> Erby Pian MD ELECTRONICALLY SIGNED 11/07/2011 17:21

## 2015-01-17 NOTE — Discharge Summary (Signed)
PATIENT NAME:  Regina Grimes, Regina Grimes MR#:  [id number] DATE OF BIRTH:  1951/01/22  DATE OF ADMISSION:  11/05/2011 DATE OF DISCHARGE:  11/08/2011  DISCHARGE DIAGNOSES:  1. Hemoptysis. 2. Left lung mass.  3. Psoriasis.    DISCHARGE MEDICATIONS:  1. Augmentin 875 mg p.o. b.i.d. for seven days.  2. Advised to stop methotrexate until lung biopsies done and results are available.   FOLLOW UP: Patient has an appointment CT guided biopsy radiology tomorrow at 8:00. Also to be n.p.o. after midnight for biopsy tomorrow and also to not take any aspirin or ibuprofen containing products until biopsies done.   CONSULTATION: Dr. Raul Del, pulmonary.   HOSPITAL COURSE: This is a 64 year old female patient who goes to Dr. Silvio Pate as primary came in because of hemoptysis for 3 to 4 days. Patient's hemoptysis worse when she lays down. Patient also had low-grade fever and mild trouble breathing when she came to the ER. Look in the history and physical for full details. Patient's x-ray showed cavitary mass-like lesion in the left lower lobe. CT of the chest also done with contrast which showed thick walled cavitary mass in the left lower lobe around 5.3 x 3.9 x 5.6 cm. Also has large left hilar lymph node. Patient also had enlarged lymph nodes in the axilla, porta hepatis. She is admitted for possible pneumonia versus malignancy versus possible lung abscess versus tuberculosis. Placed on respiratory isolation. Sputum AFB were sent and QuantiFERON gold were sent and started on Zosyn, Levaquin and clindamycin. Patient's initial white count was 9.6 and she did not have any night sweats but she did have some weight loss. Patient's sputum AFB the results are obtained from [facility] today which showed negative by the smear but the culture takes two weeks. Patient also had QuantiFERON gold was done which was also not available at this time until 02/15. Patient still had pain in the left lower chest but no hemoptysis since admission. Her  white count has been stable at 8.6 and electrolytes were normal. Sputum regular cultures showed no WBCs, rare gram-negative rods. Patient's blood cultures have been negative. Patient had a repeat x-ray chest done this morning. According to Dr. Raul Del there is not much change from before and she still has left lower lobe mass concerning for mass. Patient is scheduled for CT-guided biopsy today but she did have some breakfast so that his cancelled. Patient is requesting to go home because of some family emergency. Patient is scheduled for CT-guided biopsy in the morning and asked her to be n.p.o. and avoid aspirin and ibuprofen containing products. She will be finishing antibiotics with Augmentin 875 for a week and Dr. Raul Del will follow on the biopsy results. We think the patient likely might have malignancy with lymph node enlargement. I called the lab and spoke to them. If the AFB culture comes back positive for TB, QuantiFERON gold comes back positive she will be contacted. At this time there is low suspicion for tuberculosis, the fact that she has a large lung mass with weight loss and lymph node enlargement. Patient has been updated about the situation. Condition is stable. Vitals this morning: Temperature 97.3, pulse 70, respirations 18, blood pressure 125/79, saturation 94% on room air.   TIME SPENT ON DISCHARGE PREPARATION: More than 30 minutes.   I called radiology by myself and found out about the time for the CT-guided biopsy, that will be 8:00 tomorrow morning.  ____________________________ Epifanio Lesches, MD sk:cms D: 11/08/2011 10:59:46 ET T: 11/08/2011 11:18:53 ET JOB#:  [id number]  cc: Epifanio Lesches, MD, <Dictator> Epifanio Lesches MD ELECTRONICALLY SIGNED 11/16/2011 9:14

## 2015-01-17 NOTE — H&P (Signed)
PATIENT NAME:  Regina Grimes, Regina Grimes MR#:  [id number] DATE OF BIRTH:  1951-01-25  DATE OF ADMISSION:  11/05/2011  REFERRING PHYSICIAN: Dr. Jasmine December  FAMILY PHYSICIAN: Dr. Silvio Pate   REASON FOR ADMISSION: Hemoptysis.   HISTORY OF PRESENT ILLNESS: The patient is a 64 year old female with a history of psoriasis on methotrexate who presents with a 3 to 4 day history of hemoptysis and having some left lower pleuritic pain, worse with movement. Hemoptysis is worse after she moves. She has had low-grade fever and cough. Minimal shortness of breath. In the Emergency Room a chest x-ray revealed a left cavitary mass versus pneumonia. She is now admitted for further evaluation.   PAST MEDICAL HISTORY:  1. Psoriasis.  2. Status post appendectomy.  3. Status post bilateral tubal ligation.   MEDICATIONS: Methotrexate 15 mg p.o. q. week.   ALLERGIES: Codeine.   SOCIAL HISTORY: No history of alcohol abuse. The patient quit smoking two years ago.   FAMILY HISTORY: Positive for hypertension but otherwise unremarkable.   REVIEW OF SYSTEMS: CONSTITUTIONAL: Low-grade fever but no change in weight. EYES: No blurred or double vision. No glaucoma. ENT: No tinnitus or hearing loss. No nasal discharge or bleeding. No difficulty swallowing. RESPIRATORY: Some cough. No wheezing. Does have both hemoptysis and painful respiration. No history of tuberculosis. CARDIOVASCULAR: No chest pain or orthopnea. No palpitations or syncope. GI: No nausea, vomiting, or diarrhea. No abdominal pain. No change in bowel habits. GU: No dysuria or hematuria. No incontinence. ENDOCRINE: No polyuria or polydipsia. No heat or cold intolerance. HEMATOLOGIC: The patient denies anemia, easy bruising, or bleeding.  LYMPHATIC: No swollen glands. MUSCULOSKELETAL: The patient does have back pain, but denies any neck, shoulder, knee, or hip pain. No gout. NEUROLOGIC: No numbness or migraines. Denies stroke or seizures. PSYCH: The patient denies anxiety,  insomnia, or depression.   PHYSICAL EXAMINATION:  GENERAL: The patient is in no acute distress.   VITAL SIGNS: Vital signs are currently remarkable for a blood pressure of 220/100 with a heart rate of 94 and a respiratory rate of 18. She is afebrile.   HEENT: Normocephalic, atraumatic. Pupils equally round and reactive to light and accommodation. Extraocular movements are intact. Sclerae are anicteric. Conjunctivae are clear.  Oropharynx is clear.   NECK: Supple without jugular venous distention or bruits. No adenopathy or thyromegaly is noted.   LUNGS: Decreased breath sounds at the left base without wheezes or rales. Some dullness at the left base. No rhonchi.   CARDIAC: Regular rate and rhythm with normal S1 and S2. No significant rubs, murmurs, or gallops. PMI is nondisplaced. Chest wall is nontender.   ABDOMEN: Soft, nontender, with normoactive bowel sounds. No organomegaly or masses were appreciated. No hernias or bruits were noted.   EXTREMITIES: Without clubbing, cyanosis, or edema. Pulses were 2+ bilaterally.   SKIN: Warm and dry without rash or lesions.   NEUROLOGIC: Cranial nerves II through XII grossly intact. Deep tendon reflexes were symmetric. Motor and sensory exam is nonfocal.   PSYCH: Exam revealed a patient who is alert and oriented to person, place, and time. She was cooperative and used good judgment.   LABORATORY, DIAGNOSTIC, AND RADIOLOGICAL DATA: Chest x-ray revealed a cavitary mass lesion in the left lower lobe consistent with an abscess or cavitary malignancy. White count was 9.6 with a hemoglobin of 12.6. Glucose was 84 with a BUN of 8 and a creatinine of 0.7 and a sodium of 146 with a potassium of 3.1.   ASSESSMENT:  1.  Left lower lobe abscess versus malignancy.  2. Hemoptysis.  3. Malignant hypertension.  4. Hypokalemia.  5. Psoriasis.   PLAN: The patient will be admitted to the floor with IV antibiotics.  We will send off blood and sputum cultures at  this time. We will obtain a CT of the chest to further evaluate her left lower lobe lesion. We will hold her methotrexate for now.  We will supplement potassium and begin IV fluids. Follow up routine labs and PA and lateral chest x-ray in the morning. Further treatment and evaluation will depend upon the patient's progress.   TOTAL TIME SPENT ON THIS PATIENT: 50 minutes.    ____________________________ Leonie Douglas Doy Hutching, MD jds:bjt D: 11/05/2011 17:15:38 ET T: 11/06/2011 06:28:40 ET JOB#: [id number]  cc: Leonie Douglas. Doy Hutching, MD, <Dictator> Venia Carbon, MD Tyrone Balash Lennice Sites MD ELECTRONICALLY SIGNED 11/06/2011 10:02

## 2015-03-18 ENCOUNTER — Other Ambulatory Visit: Payer: Self-pay | Admitting: Internal Medicine

## 2015-03-18 NOTE — Telephone Encounter (Signed)
Ok to fill 

## 2015-03-18 NOTE — Telephone Encounter (Signed)
Approved: okay for 1 year

## 2015-03-19 NOTE — Telephone Encounter (Signed)
rx sent to pharmacy by e-script  

## 2015-03-23 ENCOUNTER — Telehealth: Payer: Self-pay | Admitting: *Deleted

## 2015-03-23 DIAGNOSIS — R042 Hemoptysis: Secondary | ICD-10-CM

## 2015-03-23 DIAGNOSIS — C3490 Malignant neoplasm of unspecified part of unspecified bronchus or lung: Secondary | ICD-10-CM

## 2015-03-23 NOTE — Telephone Encounter (Signed)
Pt scheduled for CXR/ Lab/ MD tomorrow per VO Dr Ma Hillock and agrees to come in for CXR at 41 followed by lab/ md

## 2015-03-24 ENCOUNTER — Other Ambulatory Visit: Payer: Self-pay | Admitting: Oncology

## 2015-03-24 ENCOUNTER — Inpatient Hospital Stay: Payer: 59

## 2015-03-24 ENCOUNTER — Ambulatory Visit
Admission: RE | Admit: 2015-03-24 | Discharge: 2015-03-24 | Disposition: A | Discharge status: Home / Self Care | Payer: 59 | Source: Ambulatory Visit | Attending: Internal Medicine | Admitting: Internal Medicine

## 2015-03-24 ENCOUNTER — Inpatient Hospital Stay: Payer: 59 | Attending: Oncology | Admitting: Oncology

## 2015-03-24 ENCOUNTER — Encounter: Payer: Self-pay | Admitting: Oncology

## 2015-03-24 ENCOUNTER — Ambulatory Visit
Admission: RE | Admit: 2015-03-24 | Discharge: 2015-03-24 | Disposition: A | Discharge status: Home / Self Care | Payer: 59 | Source: Ambulatory Visit | Attending: Oncology | Admitting: Oncology

## 2015-03-24 VITALS — BP 168/95 | HR 82 | Temp 97.4°F | Wt 162.7 lb

## 2015-03-24 DIAGNOSIS — Z79899 Other long term (current) drug therapy: Secondary | ICD-10-CM | POA: Insufficient documentation

## 2015-03-24 DIAGNOSIS — Z85118 Personal history of other malignant neoplasm of bronchus and lung: Secondary | ICD-10-CM | POA: Insufficient documentation

## 2015-03-24 DIAGNOSIS — I10 Essential (primary) hypertension: Secondary | ICD-10-CM | POA: Diagnosis not present

## 2015-03-24 DIAGNOSIS — M129 Arthropathy, unspecified: Secondary | ICD-10-CM | POA: Diagnosis not present

## 2015-03-24 DIAGNOSIS — Z9221 Personal history of antineoplastic chemotherapy: Secondary | ICD-10-CM | POA: Insufficient documentation

## 2015-03-24 DIAGNOSIS — R042 Hemoptysis: Secondary | ICD-10-CM | POA: Diagnosis not present

## 2015-03-24 DIAGNOSIS — E785 Hyperlipidemia, unspecified: Secondary | ICD-10-CM | POA: Insufficient documentation

## 2015-03-24 DIAGNOSIS — C3432 Malignant neoplasm of lower lobe, left bronchus or lung: Secondary | ICD-10-CM | POA: Diagnosis not present

## 2015-03-24 DIAGNOSIS — R05 Cough: Secondary | ICD-10-CM

## 2015-03-24 DIAGNOSIS — Z87891 Personal history of nicotine dependence: Secondary | ICD-10-CM

## 2015-03-24 DIAGNOSIS — Z803 Family history of malignant neoplasm of breast: Secondary | ICD-10-CM | POA: Diagnosis not present

## 2015-03-24 DIAGNOSIS — C3490 Malignant neoplasm of unspecified part of unspecified bronchus or lung: Secondary | ICD-10-CM

## 2015-03-24 DIAGNOSIS — Z801 Family history of malignant neoplasm of trachea, bronchus and lung: Secondary | ICD-10-CM | POA: Diagnosis not present

## 2015-03-24 DIAGNOSIS — L409 Psoriasis, unspecified: Secondary | ICD-10-CM | POA: Diagnosis not present

## 2015-03-24 DIAGNOSIS — K449 Diaphragmatic hernia without obstruction or gangrene: Secondary | ICD-10-CM | POA: Insufficient documentation

## 2015-03-24 DIAGNOSIS — M8580 Other specified disorders of bone density and structure, unspecified site: Secondary | ICD-10-CM | POA: Insufficient documentation

## 2015-03-24 DIAGNOSIS — K219 Gastro-esophageal reflux disease without esophagitis: Secondary | ICD-10-CM | POA: Insufficient documentation

## 2015-03-24 LAB — CBC WITH DIFFERENTIAL/PLATELET
BASOS ABS: 0 10*3/uL (ref 0–0.1)
BASOS PCT: 1 %
EOS ABS: 0.3 10*3/uL (ref 0–0.7)
EOS PCT: 6 %
HEMATOCRIT: 34.4 % — AB (ref 35.0–47.0)
Hemoglobin: 11 g/dL — ABNORMAL LOW (ref 12.0–16.0)
Lymphocytes Relative: 29 %
Lymphs Abs: 1.6 10*3/uL (ref 1.0–3.6)
MCH: 26.3 pg (ref 26.0–34.0)
MCHC: 31.8 g/dL — ABNORMAL LOW (ref 32.0–36.0)
MCV: 82.7 fL (ref 80.0–100.0)
MONO ABS: 0.3 10*3/uL (ref 0.2–0.9)
Monocytes Relative: 5 %
Neutro Abs: 3.3 10*3/uL (ref 1.4–6.5)
Neutrophils Relative %: 59 %
Platelets: 276 10*3/uL (ref 150–440)
RBC: 4.16 MIL/uL (ref 3.80–5.20)
RDW: 16.7 % — AB (ref 11.5–14.5)
WBC: 5.5 10*3/uL (ref 3.6–11.0)

## 2015-03-24 LAB — COMPREHENSIVE METABOLIC PANEL
ALBUMIN: 3.8 g/dL (ref 3.5–5.0)
ALK PHOS: 89 U/L (ref 38–126)
ALT: 19 U/L (ref 14–54)
AST: 26 U/L (ref 15–41)
Anion gap: 3 — ABNORMAL LOW (ref 5–15)
BUN: 12 mg/dL (ref 6–20)
CHLORIDE: 104 mmol/L (ref 101–111)
CO2: 29 mmol/L (ref 22–32)
Calcium: 8.3 mg/dL — ABNORMAL LOW (ref 8.9–10.3)
Creatinine, Ser: 0.73 mg/dL (ref 0.44–1.00)
Glucose, Bld: 114 mg/dL — ABNORMAL HIGH (ref 65–99)
POTASSIUM: 4.2 mmol/L (ref 3.5–5.1)
SODIUM: 136 mmol/L (ref 135–145)
Total Bilirubin: 0.5 mg/dL (ref 0.3–1.2)
Total Protein: 7.3 g/dL (ref 6.5–8.1)

## 2015-03-24 MED ORDER — PREDNISONE 10 MG PO TABS: ORAL_TABLET | ORAL | Status: DC

## 2015-03-24 MED ORDER — AZITHROMYCIN 500 MG PO TABS: 500.0000 mg | ORAL_TABLET | Freq: Every day | ORAL | Status: DC

## 2015-03-24 NOTE — Progress Notes (Signed)
Patient does have living will.  Former smoker. Patient coughed up bright red blood yesterday.

## 2015-03-24 NOTE — Progress Notes (Signed)
[place] @ [facility] Telephone:([phone]) [phone]  [phone]) [phone]     Regina Grimes OB: [birth year]  MR#: [id number]  URK#:[id number]  Patient Care Team: Regina Carbon, MD as PCP - General Regina Gleason, MD (Unknown Physician Specialty) Regina Pollack, MD (Cardiothoracic Surgery)  CHIEF COMPLAINT:  Chief Complaint  Patient presents with  . Follow-up    Oncology History   1.  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  T2, N0, M0 tumor (stage IB) 2.  Adjuvant chemotherapy starting in May of 2013 with cis-platinum and Taxol /carboplatinum and Taxol 3.  Patient finished chemotherapy with cis-platinum and Taxol on April 10, 2012.     Adenocarcinoma of lung, stage 2   11/24/2011 Initial Diagnosis Adenocarcinoma of lung, stage 2    Oncology Flowsheet 12/21/2011 12/22/2011  ondansetron (ZOFRAN) IJ - -  ondansetron (ZOFRAN) IV - 4 mg    INTERVAL HISTORY: 64 year old lady 64 year old lady came today with complaints of hemoptysis started yesterday.  Patient at 2 different episodes of hemoptysis.  Few days prior to having hemoptysis patient had started having cough and greenish yellowish expectoration.  No fever.  No chest pain.  Patient has previous history of stage Ib carcinoma for long status post resection and chemotherapy. Had a chest x-ray which revealed volume loss but no evidence of mass infiltrate from pneumonia Patient is extremely apprehensive Has quit smoking  REVIEW OF SYSTEMS:   GENERAL:  apprehensive lady not in any acute distress PERFORMANCE STATUS (ECOG):  0 HEENT:  No visual changes, runny nose, sore throat, mouth sores or tenderness. Lungs: Cough, yellowish,expectoration.  H Reason is not on any aspirin or ibuprofen.  e started having hemoptysis since yesterday.  2 episodes.  No chest pain. Cardiac:  No chest pain, palpitations, orthopnea, or PND. GI:  No nausea, vomiting, diarrhea,  constipation, melena or hematochezia. GU:  No urgency, frequency, dysuria, or hematuria. Musculoskeletal:  No back pain.  No joint pain.  No muscle tenderness. Extremities:  No pain or swelling. Skin:  No rashes or skin changes. Neuro:  No headache, numbness or weakness, balance or coordination issues. Endocrine:  No diabetes, thyroid issues, hot flashes or night sweats. Psych: very  apprehensive Pain:  No focal pain. Review of systems:  All other systems reviewed and found to be negative. As per HPI. Otherwise, a complete review of systems is negatve.  PAST MEDICAL HISTORY: Past Medical History  Diagnosis Date  . Psoriasis     has used methotrexate since she was 64y.o., when psoriasis flares. Currently methotrexate on hold since 11/05/2011   . Hyperlipidemia   . Osteopenia   . GERD (gastroesophageal reflux disease)   . Arthritis     arm  . Hiatal hernia   . Hypertension     treated for while in hosp. 10/2011, given med. , unsure of name of med., not told to continue   . Coughing up blood     10/2011, admitted to [facility]   . Recurrent upper respiratory infection (URI)     lung infection, treated /w antibiotic - 10/2011  . Cancer     lung cancer    PAST SURGICAL HISTORY: Past Surgical History  Procedure Laterality Date  . Colonoscopy  8/02  . Esophagogastroduodenoscopy  8/02  . Tubal ligation  1982  . Benign tumors of left foot  1992/1994  . Vd      x 2  .  Abnormal pap      low grade intrepithelial lesion. Colposcipy okay  . Video bronchoscopy  11/16/2011    Procedure: VIDEO BRONCHOSCOPY WITH FLUORO;  Surgeon: Regina Grimes. Elsworth Soho, MD;  Location: [facility];  Service: Cardiopulmonary;  Laterality: Bilateral;  . Foot neuroma surgery      L foot- 1990's  . Appendectomy      1982- along /w tubal ligation   . Video bronchoscopy  12/21/2011    Procedure: VIDEO BRONCHOSCOPY;  Surgeon: Regina Pollack, MD;  Location: [facility] OR;  Service: Thoracic;  Laterality: N/A;    FAMILY  HISTORY Family History  Problem Relation Age of Onset  . Coronary artery disease Mother   . Lung cancer Father   . Breast cancer Paternal Aunt   . Cirrhosis Maternal Grandmother   . Anesthesia problems Neg Hx     ADVANCED DIRECTIVES:  Patient does have advanced health care directive  HEALTH MAINTENANCE: History  Substance Use Topics  . Smoking status: Former Smoker -- 1.00 packs/day for 25 years    Types: Cigarettes    Quit date: 06/25/2010  . Smokeless tobacco: Never Used  . Alcohol Use: No      Allergies  Allergen Reactions  . Aspirin     REACTION: stomach burning  . Codeine     Upset stomach  . Pravastatin Sodium     REACTION: mood changes and snapping at everyone    Current Outpatient Prescriptions  Medication Sig Dispense Refill  . fluocinonide ointment (LIDEX) 3.47 % Apply 1 application topically 2 (two) times daily as needed. For psoriasis    . omeprazole (PRILOSEC) 20 MG capsule Take 20 mg by mouth daily.    Marland Kitchen azithromycin (ZITHROMAX) 500 MG tablet Take 1 tablet (500 mg total) by mouth daily. 5 tablet 0  . cimetidine (TAGAMET) 400 MG tablet Take 400 mg by mouth daily.    . methotrexate (RHEUMATREX) 2.5 MG tablet TAKE SIX TABLETS BY MOUTH ONCE A WEEK (Patient not taking: Reported on 03/24/2015) 24 tablet 11  . predniSONE (DELTASONE) 10 MG tablet Take 5 tabs po x 1 day, 4 tabs po x 1 day, 3 tabs po x 1 day, 2 tabs po x 1 day, 1 tab po x 1 day 15 tablet 0   No current facility-administered medications for this visit.    OBJECTIVE:  Filed Vitals:   03/24/15 1009  BP: 168/95  Pulse: 82  Temp: 97.4 F (36.3 C)     Body mass index is 27.07 kg/(m^2).    ECOG FS:0 - Asymptomatic  PHYSICAL EXAM: GENERAL:  Well developed, well nourished, sitting comfortably in the exam room in no acute distress. MENTAL STATUS:  Alert and oriented to person, place and time. HEAD:  .  Normocephalic, atraumatic, face symmetric, no Cushingoid features. EYES: .  Pupils equal round  and reactive to light and accomodation.  No conjunctivitis or scleral icterus. ENT:  Oropharynx clear without lesion.  Tongue normal. Mucous membranes moist.  RESPIRATORY:  Clear to auscultation without rales, wheezes or rhonchi. CARDIOVASCULAR:  Regular rate and rhythm without murmur, rub or gallop. BREAST:  Right breast without masses, skin changes or nipple discharge.  Left breast without masses, skin changes or nipple discharge. ABDOMEN:  Soft, non-tender, with active bowel sounds, and no hepatosplenomegaly.  No masses. BACK:  No CVA tenderness.  No tenderness on percussion of the back or rib cage. SKIN:  No rashes, ulcers or lesions. EXTREMITIES: No edema, no skin discoloration or tenderness.  No  palpable cords. LYMPH NODES: No palpable cervical, supraclavicular, axillary or inguinal adenopathy  NEUROLOGICAL: Unremarkable. PSYCH:  Appropriate.   LAB RESULTS:  Appointment on 03/24/2015  Component Date Value Ref Range Status  . WBC 03/24/2015 5.5  3.6 - 11.0 K/uL Final  . RBC 03/24/2015 4.16  3.80 - 5.20 MIL/uL Final  . Hemoglobin 03/24/2015 11.0* 12.0 - 16.0 g/dL Final  . HCT 03/24/2015 34.4* 35.0 - 47.0 % Final  . MCV 03/24/2015 82.7  80.0 - 100.0 fL Final  . MCH 03/24/2015 26.3  26.0 - 34.0 pg Final  . MCHC 03/24/2015 31.8* 32.0 - 36.0 g/dL Final  . RDW 03/24/2015 16.7* 11.5 - 14.5 % Final  . Platelets 03/24/2015 276  150 - 440 K/uL Final  . Neutrophils Relative % 03/24/2015 59   Final  . Neutro Abs 03/24/2015 3.3  1.4 - 6.5 K/uL Final  . Lymphocytes Relative 03/24/2015 29   Final  . Lymphs Abs 03/24/2015 1.6  1.0 - 3.6 K/uL Final  . Monocytes Relative 03/24/2015 5   Final  . Monocytes Absolute 03/24/2015 0.3  0.2 - 0.9 K/uL Final  . Eosinophils Relative 03/24/2015 6   Final  . Eosinophils Absolute 03/24/2015 0.3  0 - 0.7 K/uL Final  . Basophils Relative 03/24/2015 1   Final  . Basophils Absolute 03/24/2015 0.0  0 - 0.1 K/uL Final  . Sodium 03/24/2015 136  135 - 145 mmol/L  Final  . Potassium 03/24/2015 4.2  3.5 - 5.1 mmol/L Final  . Chloride 03/24/2015 104  101 - 111 mmol/L Final  . CO2 03/24/2015 29  22 - 32 mmol/L Final  . Glucose, Bld 03/24/2015 114* 65 - 99 mg/dL Final  . BUN 03/24/2015 12  6 - 20 mg/dL Final  . Creatinine, Ser 03/24/2015 0.73  0.44 - 1.00 mg/dL Final  . Calcium 03/24/2015 8.3* 8.9 - 10.3 mg/dL Final  . Total Protein 03/24/2015 7.3  6.5 - 8.1 g/dL Final  . Albumin 03/24/2015 3.8  3.5 - 5.0 g/dL Final  . AST 03/24/2015 26  15 - 41 U/L Final  . ALT 03/24/2015 19  14 - 54 U/L Final  . Alkaline Phosphatase 03/24/2015 89  38 - 126 U/L Final  . Total Bilirubin 03/24/2015 0.5  0.3 - 1.2 mg/dL Final  . GFR calc non Af Amer 03/24/2015 >60  >60 mL/min Final  . GFR calc Af Amer 03/24/2015 >60  >60 mL/min Final   Comment: (NOTE) The eGFR has been calculated using the CKD EPI equation. This calculation has not been validated in all clinical situations. eGFR's persistently <60 mL/min signify possible Chronic Kidney Disease.   . Anion gap 03/24/2015 3* 5 - 15 Final      STUDIES: Dg Chest 2 View  03/24/2015   CLINICAL DATA:  Hemoptysis. Congestion for approximately 2 weeks. History of lung carcinoma  EXAM: CHEST  2 VIEW  COMPARISON:  May 20, 2014  FINDINGS: . Operative change in the left base region is stable. There is no edema or consolidation. The heart size and pulmonary vascularity are normal. No adenopathy. No focal bone lesions.  IMPRESSION: Volume loss on the left, postoperative in etiology. No edema or consolidation. No mass or adenopathy appreciable.   Electronically Signed   By: Lowella Grip III M.D.   On: 03/24/2015 09:26    ASSESSMENT: Carcinoma of the lung stage IB status post resection and adjuvant chemotherapy Recent  episode of hemoptysis  MEDICAL DECISION MAKING:  chest x-rays been reviewed  Independently.  volume loss but no pulmonary mass or infiltrate Hemoptysis could be secondary to acute bronchitis.  Will start  patient on Zithromax and steroids Proceed with ct scan  If CT scan is abnormal and hemoptysis continues bronchoscopy would be advised.    Patient expressed understanding and was in agreement with this plan. She also understands that She can call clinic at any time with any questions, concerns, or complaints.    Adenocarcinoma of lung, stage 2   Staging form: Lung, AJCC 7th Edition     Clinical: Stage IIA (T2b, N0, M0) - Marni Griffon, MD   03/24/2015 8:03 PM

## 2015-03-25 ENCOUNTER — Other Ambulatory Visit: Payer: Self-pay | Admitting: Family Medicine

## 2015-03-30 ENCOUNTER — Ambulatory Visit
Admission: RE | Admit: 2015-03-30 | Discharge: 2015-03-30 | Disposition: A | Discharge status: Home / Self Care | Payer: 59 | Source: Ambulatory Visit | Attending: Oncology | Admitting: Oncology

## 2015-03-30 DIAGNOSIS — K449 Diaphragmatic hernia without obstruction or gangrene: Secondary | ICD-10-CM | POA: Insufficient documentation

## 2015-03-30 DIAGNOSIS — E079 Disorder of thyroid, unspecified: Secondary | ICD-10-CM | POA: Insufficient documentation

## 2015-03-30 DIAGNOSIS — C3490 Malignant neoplasm of unspecified part of unspecified bronchus or lung: Secondary | ICD-10-CM | POA: Diagnosis present

## 2015-03-30 DIAGNOSIS — R042 Hemoptysis: Secondary | ICD-10-CM | POA: Insufficient documentation

## 2015-03-30 MED ORDER — IOHEXOL 350 MG/ML SOLN
75.0000 mL | Freq: Once | INTRAVENOUS | Status: AC | PRN
  Administered 2015-03-30: 75 mL via INTRAVENOUS

## 2015-04-01 ENCOUNTER — Inpatient Hospital Stay: Payer: 59 | Attending: Oncology | Admitting: Oncology

## 2015-04-01 VITALS — BP 163/93 | HR 78 | Temp 97.8°F | Wt 162.3 lb

## 2015-04-01 DIAGNOSIS — C3432 Malignant neoplasm of lower lobe, left bronchus or lung: Secondary | ICD-10-CM | POA: Diagnosis not present

## 2015-04-01 DIAGNOSIS — K760 Fatty (change of) liver, not elsewhere classified: Secondary | ICD-10-CM | POA: Diagnosis not present

## 2015-04-01 DIAGNOSIS — K449 Diaphragmatic hernia without obstruction or gangrene: Secondary | ICD-10-CM | POA: Diagnosis not present

## 2015-04-01 DIAGNOSIS — Z7952 Long term (current) use of systemic steroids: Secondary | ICD-10-CM

## 2015-04-01 DIAGNOSIS — K219 Gastro-esophageal reflux disease without esophagitis: Secondary | ICD-10-CM | POA: Diagnosis not present

## 2015-04-01 DIAGNOSIS — Z803 Family history of malignant neoplasm of breast: Secondary | ICD-10-CM | POA: Diagnosis not present

## 2015-04-01 DIAGNOSIS — Z79899 Other long term (current) drug therapy: Secondary | ICD-10-CM | POA: Insufficient documentation

## 2015-04-01 DIAGNOSIS — Z9221 Personal history of antineoplastic chemotherapy: Secondary | ICD-10-CM | POA: Diagnosis not present

## 2015-04-01 DIAGNOSIS — Z801 Family history of malignant neoplasm of trachea, bronchus and lung: Secondary | ICD-10-CM | POA: Diagnosis not present

## 2015-04-01 DIAGNOSIS — I10 Essential (primary) hypertension: Secondary | ICD-10-CM | POA: Diagnosis not present

## 2015-04-01 DIAGNOSIS — M8580 Other specified disorders of bone density and structure, unspecified site: Secondary | ICD-10-CM | POA: Diagnosis not present

## 2015-04-01 DIAGNOSIS — Z87891 Personal history of nicotine dependence: Secondary | ICD-10-CM | POA: Insufficient documentation

## 2015-04-01 DIAGNOSIS — M129 Arthropathy, unspecified: Secondary | ICD-10-CM | POA: Diagnosis not present

## 2015-04-01 DIAGNOSIS — C3490 Malignant neoplasm of unspecified part of unspecified bronchus or lung: Secondary | ICD-10-CM

## 2015-04-01 DIAGNOSIS — E785 Hyperlipidemia, unspecified: Secondary | ICD-10-CM | POA: Insufficient documentation

## 2015-04-01 DIAGNOSIS — R042 Hemoptysis: Secondary | ICD-10-CM | POA: Diagnosis not present

## 2015-04-01 NOTE — Progress Notes (Signed)
Patient does have living will.  Former smoker.

## 2015-04-02 ENCOUNTER — Encounter: Payer: Self-pay | Admitting: Oncology

## 2015-04-02 NOTE — Progress Notes (Signed)
[place] @ [facility] Telephone:(336) [phone]  Fax:([phone]     AMANDINE COVINO OB: 04-02-1951  MR#: [id number]  [id number]#:[id number]  Patient Care Team: Venia Carbon, MD as PCP - General Forest Gleason, MD (Unknown Physician Specialty) Gaye Pollack, MD (Cardiothoracic Surgery)  CHIEF COMPLAINT:  Chief Complaint  Patient presents with  . Follow-up    Oncology History   1.  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  T2, N0, M0 tumor (stage IB) 2.  Adjuvant chemotherapy starting in May of 2013 with cis-platinum and Taxol /carboplatinum and Taxol 3.  Patient finished chemotherapy with cis-platinum and Taxol on April 10, 2012.     Adenocarcinoma of lung, stage 2   11/24/2011 Initial Diagnosis Adenocarcinoma of lung, stage 2    Oncology Flowsheet 12/21/2011 12/22/2011  ondansetron (ZOFRAN) IJ - -  ondansetron (ZOFRAN) IV - 4 mg    INTERVAL HISTORY: 64 year old lady 64 year old lady came today with complaints of hemoptysis started yesterday.  Patient at 2 different episodes of hemoptysis.  Few days prior to having hemoptysis patient had started having cough and greenish yellowish expectoration.  No fever.  No chest pain.  Patient has previous history of stage Ib carcinoma for long status post resection and chemotherapy. Had a chest x-ray which revealed volume loss but no evidence of mass infiltrate from pneumonia Patient is extremely apprehensive Has quit smoking  July, 2016 Patient with carcinoma of lung and hemoptysis.  No further episode of hemoptysis patient has been started on Zithromax and prednisone taper.  No chest pain also had a CT scan of the chest  REVIEW OF SYSTEMS:   GENERAL:  apprehensive lady not in any acute distress PERFORMANCE STATUS (ECOG):  0 HEENT:  No visual changes, runny nose, sore throat, mouth sores or tenderness. Lungs: Cough, yellowish,expectoration.  H Reason is not on  any aspirin or ibuprofen.  e started having hemoptysis since yesterday.  2 episodes.  No chest pain. Cardiac:  No chest pain, palpitations, orthopnea, or PND. GI:  No nausea, vomiting, diarrhea, constipation, melena or hematochezia. GU:  No urgency, frequency, dysuria, or hematuria. Musculoskeletal:  No back pain.  No joint pain.  No muscle tenderness. Extremities:  No pain or swelling. Skin:  No rashes or skin changes. Neuro:  No headache, numbness or weakness, balance or coordination issues. Endocrine:  No diabetes, thyroid issues, hot flashes or night sweats. Psych: very  apprehensive Pain:  No focal pain. Review of systems:  All other systems reviewed and found to be negative. As per HPI. Otherwise, a complete review of systems is negatve.  PAST MEDICAL HISTORY: Past Medical History  Diagnosis Date  . Psoriasis     has used methotrexate since she was 64y.o., when psoriasis flares. Currently methotrexate on hold since 11/05/2011   . Hyperlipidemia   . Osteopenia   . GERD (gastroesophageal reflux disease)   . Arthritis     arm  . Hiatal hernia   . Hypertension     treated for while in hosp. 10/2011, given med. , unsure of name of med., not told to continue   . Coughing up blood     10/2011, admitted to [facility]   . Recurrent upper respiratory infection (URI)     lung infection, treated /w antibiotic - 10/2011  . Cancer 10/2011    Left Lung cancer with Partial Lobectomy.    PAST SURGICAL HISTORY: Past Surgical History  Procedure Laterality Date  . Colonoscopy  8/02  . Esophagogastroduodenoscopy  8/02  . Tubal ligation  1982  . Benign tumors of left foot  1992/1994  . Vd      x 2  . Abnormal pap      low grade intrepithelial lesion. Colposcipy okay  . Video bronchoscopy  11/16/2011    Procedure: VIDEO BRONCHOSCOPY WITH FLUORO;  Surgeon: Leanna Sato. Elsworth Soho, MD;  Location: [facility];  Service: Cardiopulmonary;  Laterality: Bilateral;  . Foot neuroma surgery      L foot- 1990's   . Appendectomy      1982- along /w tubal ligation   . Video bronchoscopy  12/21/2011    Procedure: VIDEO BRONCHOSCOPY;  Surgeon: Gaye Pollack, MD;  Location: [facility] OR;  Service: Thoracic;  Laterality: N/A;    FAMILY HISTORY Family History  Problem Relation Age of Onset  . Coronary artery disease Mother   . Lung cancer Father   . Breast cancer Paternal Aunt   . Cirrhosis Maternal Grandmother   . Anesthesia problems Neg Hx     ADVANCED DIRECTIVES:  Patient does have advanced health care directive  HEALTH MAINTENANCE: History  Substance Use Topics  . Smoking status: Former Smoker -- 1.00 packs/day for 25 years    Types: Cigarettes    Quit date: 06/25/2010  . Smokeless tobacco: Never Used  . Alcohol Use: No      Allergies  Allergen Reactions  . Aspirin     REACTION: stomach burning  . Codeine     Upset stomach  . Pravastatin Sodium     REACTION: mood changes and snapping at everyone    Current Outpatient Prescriptions  Medication Sig Dispense Refill  . cimetidine (TAGAMET) 400 MG tablet Take 400 mg by mouth daily.    . fluocinonide ointment (LIDEX) 9.50 % Apply 1 application topically 2 (two) times daily as needed. For psoriasis    . methotrexate (RHEUMATREX) 2.5 MG tablet TAKE SIX TABLETS BY MOUTH ONCE A WEEK 24 tablet 11  . omeprazole (PRILOSEC) 20 MG capsule Take 20 mg by mouth daily.    Marland Kitchen azithromycin (ZITHROMAX) 500 MG tablet Take 1 tablet (500 mg total) by mouth daily. (Patient not taking: Reported on 04/01/2015) 5 tablet 0  . predniSONE (DELTASONE) 10 MG tablet Take 5 tabs po x 1 day, 4 tabs po x 1 day, 3 tabs po x 1 day, 2 tabs po x 1 day, 1 tab po x 1 day (Patient not taking: Reported on 04/01/2015) 15 tablet 0   No current facility-administered medications for this visit.    OBJECTIVE:  Filed Vitals:   04/01/15 1101  BP: 163/93  Pulse: 78  Temp: 97.8 F (36.6 C)     Body mass index is 27 kg/(m^2).    ECOG FS:0 - Asymptomatic  PHYSICAL EXAM: GENERAL:   Well developed, well nourished, sitting comfortably in the exam room in no acute distress. MENTAL STATUS:  Alert and oriented to person, place and time. HEAD:  .  Normocephalic, atraumatic, face symmetric, no Cushingoid features. EYES: .  Pupils equal round and reactive to light and accomodation.  No conjunctivitis or scleral icterus. ENT:  Oropharynx clear without lesion.  Tongue normal. Mucous membranes moist.  RESPIRATORY:  Clear to auscultation without rales, wheezes or rhonchi. CARDIOVASCULAR:  Regular rate and rhythm without murmur, rub or gallop. BREAST:  Right breast without masses, skin changes or nipple discharge.  Left breast without masses, skin changes or nipple discharge. ABDOMEN:  Soft, non-tender, with active bowel sounds, and no hepatosplenomegaly.  No masses. BACK:  No CVA tenderness.  No tenderness on percussion of the back or rib cage. SKIN:  No rashes, ulcers or lesions. EXTREMITIES: No edema, no skin discoloration or tenderness.  No palpable cords. LYMPH NODES: No palpable cervical, supraclavicular, axillary or inguinal adenopathy  NEUROLOGICAL: Unremarkable. PSYCH:  Appropriate.   LAB RESULTS:  No visits with results within 2 Day(s) from this visit. Latest known visit with results is:  Appointment on 03/24/2015  Component Date Value Ref Range Status  . WBC 03/24/2015 5.5  3.6 - 11.0 K/uL Final  . RBC 03/24/2015 4.16  3.80 - 5.20 MIL/uL Final  . Hemoglobin 03/24/2015 11.0* 12.0 - 16.0 g/dL Final  . HCT 03/24/2015 34.4* 35.0 - 47.0 % Final  . MCV 03/24/2015 82.7  80.0 - 100.0 fL Final  . MCH 03/24/2015 26.3  26.0 - 34.0 pg Final  . MCHC 03/24/2015 31.8* 32.0 - 36.0 g/dL Final  . RDW 03/24/2015 16.7* 11.5 - 14.5 % Final  . Platelets 03/24/2015 276  150 - 440 K/uL Final  . Neutrophils Relative % 03/24/2015 59   Final  . Neutro Abs 03/24/2015 3.3  1.4 - 6.5 K/uL Final  . Lymphocytes Relative 03/24/2015 29   Final  . Lymphs Abs 03/24/2015 1.6  1.0 - 3.6 K/uL Final   . Monocytes Relative 03/24/2015 5   Final  . Monocytes Absolute 03/24/2015 0.3  0.2 - 0.9 K/uL Final  . Eosinophils Relative 03/24/2015 6   Final  . Eosinophils Absolute 03/24/2015 0.3  0 - 0.7 K/uL Final  . Basophils Relative 03/24/2015 1   Final  . Basophils Absolute 03/24/2015 0.0  0 - 0.1 K/uL Final  . Sodium 03/24/2015 136  135 - 145 mmol/L Final  . Potassium 03/24/2015 4.2  3.5 - 5.1 mmol/L Final  . Chloride 03/24/2015 104  101 - 111 mmol/L Final  . CO2 03/24/2015 29  22 - 32 mmol/L Final  . Glucose, Bld 03/24/2015 114* 65 - 99 mg/dL Final  . BUN 03/24/2015 12  6 - 20 mg/dL Final  . Creatinine, Ser 03/24/2015 0.73  0.44 - 1.00 mg/dL Final  . Calcium 03/24/2015 8.3* 8.9 - 10.3 mg/dL Final  . Total Protein 03/24/2015 7.3  6.5 - 8.1 g/dL Final  . Albumin 03/24/2015 3.8  3.5 - 5.0 g/dL Final  . AST 03/24/2015 26  15 - 41 U/L Final  . ALT 03/24/2015 19  14 - 54 U/L Final  . Alkaline Phosphatase 03/24/2015 89  38 - 126 U/L Final  . Total Bilirubin 03/24/2015 0.5  0.3 - 1.2 mg/dL Final  . GFR calc non Af Amer 03/24/2015 >60  >60 mL/min Final  . GFR calc Af Amer 03/24/2015 >60  >60 mL/min Final   Comment: (NOTE) The eGFR has been calculated using the CKD EPI equation. This calculation has not been validated in all clinical situations. eGFR's persistently <60 mL/min signify possible Chronic Kidney Disease.   . Anion gap 03/24/2015 3* 5 - 15 Final      STUDIES: Dg Chest 2 View  03/24/2015   CLINICAL DATA:  Hemoptysis. Congestion for approximately 2 weeks. History of lung carcinoma  EXAM: CHEST  2 VIEW  COMPARISON:  May 20, 2014  FINDINGS: . Operative change in the left base region is stable. There is no edema or consolidation. The heart size and pulmonary vascularity are normal. No adenopathy. No focal bone lesions.  IMPRESSION: Volume loss on the  left, postoperative in etiology. No edema or consolidation. No mass or adenopathy appreciable.   Electronically Signed   By: Lowella Grip III M.D.   On: 03/24/2015 09:26   Ct Chest W Contrast  03/30/2015   CLINICAL DATA:  History of left lung cancer with partial lobectomy and 2013. 1 recent episode of hemoptysis (03/23/2015).  EXAM: CT CHEST WITH CONTRAST  TECHNIQUE: Multidetector CT imaging of the chest was performed during intravenous contrast administration.  CONTRAST:  58mL OMNIPAQUE IOHEXOL 350 MG/ML SOLN  COMPARISON:  05/12/2013  FINDINGS: Chest wall: No breast masses, supraclavicular or axillary lymphadenopathy. Stable 15 mm right thyroid lobe lesion. The bony thorax is intact. No destructive bone lesions or spinal canal compromise. Stable osteoporotic changes.  Mediastinum: The heart is normal in size. No pericardial effusion. The aorta is normal in caliber. No dissection. The branch vessels are patent. Stable scattered mediastinal lymph nodes. No mass or adenopathy. The esophagus is grossly normal. There is a small hiatal hernia.  Lungs/ pleura: Stable surgical changes from a left lower lobe lobectomy. No CT findings for recurrent tumor or metastatic pulmonary disease. No acute pulmonary findings. No pleural effusion. Stable biapical parenchymal scarring changes.  Upper abdomen: Mild diffuse fatty infiltration of the liver. No focal hepatic lesions. No adrenal gland lesions.  IMPRESSION: 1. Stable surgical changes from a left lower lobe lobectomy. No findings for recurrent tumor or metastatic disease. 2. No acute pulmonary findings. 3. Small scattered stable mediastinal lymph nodes. No mass or adenopathy. 4. Stable 15 mm right thyroid lobe lesion. 5. Small hiatal hernia.   Electronically Signed   By: Marijo Sanes M.D.   On: 03/30/2015 13:41    ASSESSMENT: Carcinoma of the lung stage IB status post resection and adjuvant chemotherapy Hemoptysis is resolved Patient continues to be on prednisone taper and Zithromax Had a CT scan of the chest  MEDICAL DECISION MAKING:  ct scan of the chest has been reviewed independently  there is no evidence of mass or enlarged lymph nodes.  (Any pathological lymph node was not noticed) Discussed his findings with the patient.  If there is no further hemoptysis no further intervention will be planned but the patient continues to hemoptysis bronchoscopy would be planned.  Ace and understood and will report to me if there is any further hemoptysis. An appointment in 6 months or before if there is any problem   Patient expressed understanding and was in agreement with this plan. She also understands that She can call clinic at any time with any questions, concerns, or complaints.    Adenocarcinoma of lung, stage 2   Staging form: Lung, AJCC 7th Edition     Clinical: Stage IIA (T2b, N0, M0) - Unsigned   Forest Gleason, MD   04/02/2015 1:12 PM

## 2015-04-06 ENCOUNTER — Ambulatory Visit
Admission: RE | Admit: 2015-04-06 | Discharge: 2015-04-06 | Disposition: A | Discharge status: Home / Self Care | Payer: 59 | Source: Ambulatory Visit | Attending: Oncology | Admitting: Oncology

## 2015-04-06 DIAGNOSIS — E041 Nontoxic single thyroid nodule: Secondary | ICD-10-CM | POA: Diagnosis not present

## 2015-04-06 DIAGNOSIS — C3490 Malignant neoplasm of unspecified part of unspecified bronchus or lung: Secondary | ICD-10-CM | POA: Diagnosis present

## 2015-04-26 ENCOUNTER — Other Ambulatory Visit: Payer: Self-pay | Admitting: Unknown Physician Specialty

## 2015-04-26 DIAGNOSIS — E041 Nontoxic single thyroid nodule: Secondary | ICD-10-CM

## 2015-04-30 ENCOUNTER — Other Ambulatory Visit: Payer: Self-pay | Admitting: Radiology

## 2015-05-03 ENCOUNTER — Ambulatory Visit
Admission: RE | Admit: 2015-05-03 | Discharge: 2015-05-03 | Disposition: A | Discharge status: Home / Self Care | Payer: 59 | Source: Ambulatory Visit | Attending: Unknown Physician Specialty | Admitting: Unknown Physician Specialty

## 2015-05-03 DIAGNOSIS — E042 Nontoxic multinodular goiter: Secondary | ICD-10-CM | POA: Diagnosis not present

## 2015-05-03 DIAGNOSIS — E041 Nontoxic single thyroid nodule: Secondary | ICD-10-CM

## 2015-05-03 NOTE — Procedures (Signed)
Informed consent was obtained. Under US guidance, FNA of right thyroid nodule was performed. No immediate complication.

## 2015-05-05 LAB — CYTOLOGY - NON PAP

## 2015-05-13 ENCOUNTER — Other Ambulatory Visit: Payer: Self-pay | Admitting: Unknown Physician Specialty

## 2015-05-13 DIAGNOSIS — E041 Nontoxic single thyroid nodule: Secondary | ICD-10-CM

## 2015-07-29 ENCOUNTER — Telehealth: Payer: Self-pay | Admitting: Internal Medicine

## 2015-07-29 NOTE — Telephone Encounter (Signed)
Please let her know that this is borderline. It will help me if a fasting blood sugar was also done I will review the labs when I get them

## 2015-07-29 NOTE — Telephone Encounter (Signed)
Patient called and said she had lab work done at work her A1c was high-6.3.  Patient had labs done in August.  Patient has had blurry vision in left eye and tingling on the bottom of right foot.  Patient is going to fax over the lab report and asked for Dr.Letvak to let her know how to proceed.

## 2015-07-30 ENCOUNTER — Telehealth: Payer: Self-pay | Admitting: Internal Medicine

## 2015-07-30 DIAGNOSIS — R7309 Other abnormal glucose: Secondary | ICD-10-CM

## 2015-07-30 NOTE — Telephone Encounter (Signed)
Correction--- her sugar was done and is just slightly elevated I would recommend just rechecking this at her next physical--- she should have at least a 4 hour fast before that visit She should avoid concentrated sweets like sugared beverages and desserts

## 2015-07-30 NOTE — Telephone Encounter (Signed)
Lm on pts requesting a call back

## 2015-07-30 NOTE — Addendum Note (Signed)
Addended by: Viviana Simpler I on: 07/30/2015 01:48 PM   Modules accepted: Orders

## 2015-07-30 NOTE — Telephone Encounter (Signed)
Lm on pts vm and advised per Dr Silvio Pate. Pt advised to contact office should she have additional questions

## 2015-07-30 NOTE — Telephone Encounter (Signed)
Spoke to pt and advised per Dr Silvio Pate. Pt states she is willing to have fasting labs but is requesting Orders be faxed to her at [facility]

## 2015-07-30 NOTE — Telephone Encounter (Signed)
I want to wait to see the labs and see if sugar was done With A1c of 6.3%, no need to rush (can just check at her next appt) Her last sugar here was mildly elevated, but doesn't indicate diabetes

## 2015-07-30 NOTE — Telephone Encounter (Signed)
Reviewed labs from [facility] No fasting sugar--just the A1c Will set up fasting glucose to check  Order put in---please schedule her for a fasting blood draw

## 2015-08-02 NOTE — Telephone Encounter (Signed)
Left message on machine with results, advised pt to call back if she has any questions.

## 2015-10-06 ENCOUNTER — Inpatient Hospital Stay: Payer: 59 | Admitting: Oncology

## 2015-10-06 ENCOUNTER — Inpatient Hospital Stay: Payer: 59

## 2015-11-03 ENCOUNTER — Encounter: Payer: Self-pay | Admitting: Hematology and Oncology

## 2015-11-05 ENCOUNTER — Ambulatory Visit
Admission: RE | Admit: 2015-11-05 | Discharge: 2015-11-05 | Disposition: A | Discharge status: Home / Self Care | Payer: 59 | Source: Ambulatory Visit | Attending: Unknown Physician Specialty | Admitting: Unknown Physician Specialty

## 2015-11-05 DIAGNOSIS — E041 Nontoxic single thyroid nodule: Secondary | ICD-10-CM | POA: Diagnosis present

## 2015-11-09 ENCOUNTER — Encounter: Payer: Self-pay | Admitting: Oncology

## 2015-11-09 ENCOUNTER — Inpatient Hospital Stay (HOSPITAL_BASED_OUTPATIENT_CLINIC_OR_DEPARTMENT_OTHER): Payer: 59 | Admitting: Oncology

## 2015-11-09 ENCOUNTER — Inpatient Hospital Stay: Payer: 59 | Attending: Oncology

## 2015-11-09 VITALS — BP 171/91 | HR 80 | Temp 97.6°F | Resp 80 | Wt 166.2 lb

## 2015-11-09 DIAGNOSIS — Z85118 Personal history of other malignant neoplasm of bronchus and lung: Secondary | ICD-10-CM | POA: Insufficient documentation

## 2015-11-09 DIAGNOSIS — M129 Arthropathy, unspecified: Secondary | ICD-10-CM

## 2015-11-09 DIAGNOSIS — Z803 Family history of malignant neoplasm of breast: Secondary | ICD-10-CM

## 2015-11-09 DIAGNOSIS — L409 Psoriasis, unspecified: Secondary | ICD-10-CM | POA: Insufficient documentation

## 2015-11-09 DIAGNOSIS — I10 Essential (primary) hypertension: Secondary | ICD-10-CM | POA: Insufficient documentation

## 2015-11-09 DIAGNOSIS — M8580 Other specified disorders of bone density and structure, unspecified site: Secondary | ICD-10-CM | POA: Insufficient documentation

## 2015-11-09 DIAGNOSIS — E785 Hyperlipidemia, unspecified: Secondary | ICD-10-CM | POA: Insufficient documentation

## 2015-11-09 DIAGNOSIS — C3490 Malignant neoplasm of unspecified part of unspecified bronchus or lung: Secondary | ICD-10-CM

## 2015-11-09 DIAGNOSIS — K449 Diaphragmatic hernia without obstruction or gangrene: Secondary | ICD-10-CM | POA: Insufficient documentation

## 2015-11-09 DIAGNOSIS — Z801 Family history of malignant neoplasm of trachea, bronchus and lung: Secondary | ICD-10-CM

## 2015-11-09 DIAGNOSIS — E041 Nontoxic single thyroid nodule: Secondary | ICD-10-CM | POA: Diagnosis not present

## 2015-11-09 DIAGNOSIS — Z9221 Personal history of antineoplastic chemotherapy: Secondary | ICD-10-CM

## 2015-11-09 DIAGNOSIS — K219 Gastro-esophageal reflux disease without esophagitis: Secondary | ICD-10-CM | POA: Insufficient documentation

## 2015-11-09 DIAGNOSIS — Z87891 Personal history of nicotine dependence: Secondary | ICD-10-CM | POA: Diagnosis not present

## 2015-11-09 NOTE — Progress Notes (Signed)
Patient states she had a thyroid biopsy in Friday with Dr. Tami Ribas.  States she heard results and thyroid is stable at this time.

## 2015-11-09 NOTE — Progress Notes (Signed)
[facility]  Telephone:(336) [phone]  Fax:(336) [phone]     Regina Grimes DOB: 19-Nov-1950  MR#: [id number]  RXV#:[id number]  Patient Care Team: Venia Carbon, MD as PCP - General Forest Gleason, MD (Unknown Physician Specialty) Gaye Pollack, MD (Cardiothoracic Surgery)  CHIEF COMPLAINT:  Chief Complaint  Patient presents with  . Adenocarcinoma of lung    INTERVAL HISTORY:  Patient is here for further follow-up regarding a left lower lobe lung cancer from March 2013. Patient is status post lobectomy as well as completion of platinum and Taxol chemotherapy. She was originally stage IIa with a T2b N0 M0 tumor. She was seen last in our office in June 2016 with complaints of hemoptysis as well as cough with yellow sputum production. CT scan was performed at that time and did not reveal any acute findings. She was treated with azithromycin as well as a prednisone taper and hemoptysis resolved. Today she overall reports feeling very well. She has been following with Dr. Tami Ribas regarding a right thyroid nodule. She is otherwise been in her normal state of health and denies any acute complaints.  REVIEW OF SYSTEMS:   Review of Systems  Constitutional: Negative for fever, chills, weight loss, malaise/fatigue and diaphoresis.  HENT: Negative.   Eyes: Negative.   Respiratory: Negative for cough, hemoptysis, sputum production, shortness of breath and wheezing.   Cardiovascular: Negative for chest pain, palpitations, orthopnea, claudication, leg swelling and PND.  Gastrointestinal: Negative for heartburn, nausea, vomiting, abdominal pain, diarrhea, constipation, blood in stool and melena.  Genitourinary: Negative.   Musculoskeletal: Negative.   Skin: Negative.   Neurological: Negative for dizziness, tingling, focal weakness, seizures and weakness.  Endo/Heme/Allergies: Does not bruise/bleed easily.  Psychiatric/Behavioral: Negative for depression. The patient is not  nervous/anxious and does not have insomnia.     As per HPI. Otherwise, a complete review of systems is negatve.  ONCOLOGY HISTORY: Oncology History   1.  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  T2, N0, M0 tumor (stage IB) 2.  Adjuvant chemotherapy starting in May of 2013 with cis-platinum and Taxol /carboplatinum and Taxol 3.  Patient finished chemotherapy with cis-platinum and Taxol on April 10, 2012.     Adenocarcinoma of lung, stage 2 ([place])   11/24/2011 Initial Diagnosis Adenocarcinoma of lung, stage 2    PAST MEDICAL HISTORY: Past Medical History  Diagnosis Date  . Psoriasis     has used methotrexate since she was 65y.o., when psoriasis flares. Currently methotrexate on hold since 11/05/2011   . Hyperlipidemia   . Osteopenia   . GERD (gastroesophageal reflux disease)   . Arthritis     arm  . Hiatal hernia   . Hypertension     treated for while in hosp. 10/2011, given med. , unsure of name of med., not told to continue   . Coughing up blood     10/2011, admitted to [facility]   . Recurrent upper respiratory infection (URI)     lung infection, treated /w antibiotic - 10/2011  . Cancer 10/2011    Left Lung cancer with Partial Lobectomy.    PAST SURGICAL HISTORY: Past Surgical History  Procedure Laterality Date  . Colonoscopy  8/02  . Esophagogastroduodenoscopy  8/02  . Tubal ligation  1982  . Benign tumors of left foot  1992/1994  . Vd      x 2  . Abnormal pap  low grade intrepithelial lesion. Colposcipy okay  . Video bronchoscopy  11/16/2011    Procedure: VIDEO BRONCHOSCOPY WITH FLUORO;  Surgeon: Leanna Sato. Elsworth Soho, MD;  Location: [facility];  Service: Cardiopulmonary;  Laterality: Bilateral;  . Foot neuroma surgery      L foot- 1990's  . Appendectomy      1982- along /w tubal ligation   . Video bronchoscopy  12/21/2011    Procedure: VIDEO BRONCHOSCOPY;  Surgeon: Gaye Pollack, MD;   Location: [facility];  Service: Thoracic;  Laterality: N/A;    FAMILY HISTORY Family History  Problem Relation Age of Onset  . Coronary artery disease Mother   . Lung cancer Father   . Breast cancer Paternal Aunt   . Cirrhosis Maternal Grandmother   . Anesthesia problems Neg Hx     GYNECOLOGIC HISTORY:  No LMP recorded. Patient is postmenopausal.     ADVANCED DIRECTIVES:    HEALTH MAINTENANCE: Social History  Substance Use Topics  . Smoking status: Former Smoker -- 1.00 packs/day for 25 years    Types: Cigarettes    Quit date: 06/25/2010  . Smokeless tobacco: Never Used  . Alcohol Use: No     Colonoscopy:  PAP:  Bone density:  Mammogram:  Allergies  Allergen Reactions  . Aspirin     REACTION: stomach burning  . Codeine     Upset stomach  . Pravastatin Sodium     REACTION: mood changes and snapping at everyone    Current Outpatient Prescriptions  Medication Sig Dispense Refill  . omeprazole (PRILOSEC) 20 MG capsule Take 20 mg by mouth daily.     No current facility-administered medications for this visit.    OBJECTIVE: BP 171/91 mmHg  Pulse 80  Temp(Src) 97.6 F (36.4 C) (Tympanic)  Resp 80  Wt 166 lb 3.6 oz (75.4 kg)   Body mass index is 27.66 kg/(m^2).    ECOG FS:0 - Asymptomatic  General: Well-developed, well-nourished, no acute distress. Eyes: Pink conjunctiva, anicteric sclera. HEENT: Normocephalic, moist mucous membranes, clear oropharnyx. Lungs: Clear to auscultation bilaterally. Heart: Regular rate and rhythm. No rubs, murmurs, or gallops. Abdomen: Soft, nontender, nondistended. No organomegaly noted, normoactive bowel sounds. Musculoskeletal: No edema, cyanosis, or clubbing. Neuro: Alert, answering all questions appropriately. Cranial nerves grossly intact. Skin: No rashes or petechiae noted. Psych: Normal affect. Lymphatics: No cervical, clavicular LAD.   LAB RESULTS:  No visits with results within 3 Day(s) from this visit. Latest known  visit with results is:  Hospital Outpatient Visit on 05/03/2015  Component Date Value Ref Range Status  . CYTOLOGY - NON GYN 05/03/2015    Final                   Value:Cytology - Non PAP CASE: [id number] PATIENT: Regina Grimes Non-Gyn Cytology Report     SPECIMEN SUBMITTED: A. FNA, right thyroid nodule  CLINICAL HISTORY: Solitary, partially cystic dominant right thyroid nodule measuring 1.8 x 2.2 x 1.3 cm. History of lung cancer.  PRE-OPERATIVE DIAGNOSIS: Thyroid nodule  POST-OPERATIVE DIAGNOSIS: Same as pre-op     DIAGNOSIS: A. THYROID NODULE, RIGHT LOBE; ULTRASOUND GUIDED FNA: - ATYPIA OF UNDETERMINED SIGNIFICANCE (BETHESDA CATEGORY III). - HEMOSIDERIN LADEN MACROPHAGES, CONSISTENT WITH CYSTIC DEGENERATION.  Comment: Smears contain colloid, hemosiderin laden macrophages, benign follicular groups, and a few groups of atypical cells with enlarged, haphazardly arranged nuclei. A mitotic figure is present.  GROSS DESCRIPTION:  A. Site: right thyroid nodule Procedure: ultrasound Cytotechnologist: Rivka Barbara and Ashlee Howze Specimen(s) collected: 4  Diff Quik stained slides 4 Pap stained slides Specimen la                         beled right:      Description: pale pink CytoLyt solution      Submitted for:           ThinPrep   Final Diagnosis performed by Quay Burow, MD.  Electronically signed 05/05/2015 3:51:50PM    The electronic signature indicates that the named Attending Pathologist has evaluated the specimen  Technical component performed at [facility], [address] [place], [place] [address] Lab: [phone] Dir: Darrick Penna. Evette Doffing, MD  Professional component performed at [facility], [facility], [place], [place], [place] [address] Lab: [phone] Dir: Dellia Nims. Rubinas, MD      STUDIES: No results found.  ASSESSMENT:  Carcinoma of left lower lobe of lung, stage IIa.  PLAN:  1. Carcinoma of left lung.  Originally diagnosed in March 2013. Patient is status post resection as well as completion of adjuvant chemotherapy with platinum and Taxol. Patient reports feeling very well and denies any complaints of cough, shortness of breath, or hemoptysis. Clinically there is no evidence of recurrent or progressive disease. Patient was seen in the office last approximately 6 months ago with new hemoptysis as well as productive cough with dark yellow sputum. CT scan was performed at that time and found no acute abnormalities. Patient was treated with azithromycin and prednisone taper and all hemoptysis and symptoms resolved. 2. Right thyroid nodule. Patient recently on February 10 had an ultrasound to evaluate for increase in size. Ultrasound determined that nodule was stable in size. She continues with follow-up with Dr. Tami Ribas near the end of February.  We will continue with routine follow-up in 6 months.  Patient expressed understanding and was in agreement with this plan. She also understands that She can call clinic at any time with any questions, concerns, or complaints.   Dr. Oliva Bustard was available for consultation and review of plan of care for this patient.  Adenocarcinoma of lung, stage 2 ([place])   Staging form: Lung, AJCC 7th Edition     Clinical: Stage IIA (T2b, N0, M0) - Unsigned   Evlyn Kanner, NP   11/09/2015 3:51 PM

## 2015-11-25 ENCOUNTER — Other Ambulatory Visit: Payer: Self-pay | Admitting: Otolaryngology

## 2015-11-25 DIAGNOSIS — E041 Nontoxic single thyroid nodule: Secondary | ICD-10-CM

## 2015-11-26 ENCOUNTER — Ambulatory Visit: Payer: 59

## 2015-12-14 ENCOUNTER — Other Ambulatory Visit: Payer: Self-pay

## 2015-12-14 ENCOUNTER — Encounter: Payer: Self-pay | Admitting: Internal Medicine

## 2016-03-06 ENCOUNTER — Ambulatory Visit (INDEPENDENT_AMBULATORY_CARE_PROVIDER_SITE_OTHER): Payer: 59 | Admitting: Internal Medicine

## 2016-03-06 ENCOUNTER — Encounter: Payer: Self-pay | Admitting: Internal Medicine

## 2016-03-06 VITALS — BP 138/90 | HR 74 | Temp 98.2°F | Ht 65.5 in | Wt 160.0 lb

## 2016-03-06 DIAGNOSIS — E785 Hyperlipidemia, unspecified: Secondary | ICD-10-CM | POA: Diagnosis not present

## 2016-03-06 DIAGNOSIS — Z0000 Encounter for general adult medical examination without abnormal findings: Secondary | ICD-10-CM | POA: Diagnosis not present

## 2016-03-06 DIAGNOSIS — K219 Gastro-esophageal reflux disease without esophagitis: Secondary | ICD-10-CM | POA: Diagnosis not present

## 2016-03-06 DIAGNOSIS — R7301 Impaired fasting glucose: Secondary | ICD-10-CM

## 2016-03-06 DIAGNOSIS — L409 Psoriasis, unspecified: Secondary | ICD-10-CM | POA: Diagnosis not present

## 2016-03-06 NOTE — Progress Notes (Signed)
Pre visit review using our clinic review tool, if applicable. No additional management support is needed unless otherwise documented below in the visit note. 

## 2016-03-06 NOTE — Assessment & Plan Note (Signed)
Prefers no meds

## 2016-03-06 NOTE — Addendum Note (Signed)
Addended by: Marchia Bond on: 03/06/2016 03:54 PM   Modules accepted: Miquel Dunn

## 2016-03-06 NOTE — Assessment & Plan Note (Signed)
Only takes the MTX occasionally Will check labs now (and later in year if she takes it more)

## 2016-03-06 NOTE — Addendum Note (Signed)
Addended by: Marchia Bond on: 03/06/2016 03:48 PM   Modules accepted: Miquel Dunn

## 2016-03-06 NOTE — Progress Notes (Signed)
Subjective:    Patient ID: Regina Grimes, female    DOB: Sep 17, 1951, 65 y.o.   MRN: [id number]  HPI Here for physical  Not consistent with methotrexate Only taking occasionally now with warmer weather (and short sleeves) T-gel for scalp and elbows is the other site  On PPI for GERD Occasionally takes 2 at a time no trouble with swallowing but has trouble talking at night, possibly due to due to thyroid nodule Has drainage--delsym some help Continues with [facility]---due for another biopsy in August (now seeing Dr Richardson Landry)  Lung cancer seems quiet Has kept up with Dr Delrae Rend he has now retired  Mom has moved to [place] to help her now--instead of [place]  Current Outpatient Prescriptions on File Prior to Visit  Medication Sig Dispense Refill  . folic acid (FOLVITE) 1 MG tablet Take 1 mg by mouth daily.    Marland Kitchen omeprazole (PRILOSEC) 20 MG capsule Take 20 mg by mouth daily.     No current facility-administered medications on file prior to visit.    Allergies  Allergen Reactions  . Aspirin     REACTION: stomach burning  . Codeine     Upset stomach  . Pravastatin Sodium     REACTION: mood changes and snapping at everyone    Past Medical History  Diagnosis Date  . Psoriasis     has used methotrexate since she was 65y.o., when psoriasis flares. Currently methotrexate on hold since 11/05/2011   . Hyperlipidemia   . Osteopenia   . GERD (gastroesophageal reflux disease)   . Arthritis     arm  . Hiatal hernia   . Hypertension     treated for while in hosp. 10/2011, given med. , unsure of name of med., not told to continue   . Coughing up blood     10/2011, admitted to [facility]   . Recurrent upper respiratory infection (URI)     lung infection, treated /w antibiotic - 10/2011  . Cancer [facility]) 10/2011    Left Lung cancer with Partial Lobectomy.    Past Surgical History  Procedure Laterality Date  . Colonoscopy  8/02  . Esophagogastroduodenoscopy   8/02  . Tubal ligation  1982  . Benign tumors of left foot  1992/1994  . Vd      x 2  . Abnormal pap      low grade intrepithelial lesion. Colposcipy okay  . Video bronchoscopy  11/16/2011    Procedure: VIDEO BRONCHOSCOPY WITH FLUORO;  Surgeon: Leanna Sato. Elsworth Soho, MD;  Location: [facility];  Service: Cardiopulmonary;  Laterality: Bilateral;  . Foot neuroma surgery      L foot- 1990's  . Appendectomy      1982- along /w tubal ligation   . Video bronchoscopy  12/21/2011    Procedure: VIDEO BRONCHOSCOPY;  Surgeon: Gaye Pollack, MD;  Location[facility];  Service: Thoracic;  Laterality: N/A;    Family History  Problem Relation Age of Onset  . Coronary artery disease Mother   . Heart disease Mother     atrial fibrillation  . Lung cancer Father   . Breast cancer Paternal Aunt   . Cirrhosis Maternal Grandmother   . Anesthesia problems Neg Hx   . Lung cancer Brother     Social History   Social History  . Marital Status: Married    Spouse Name: N/A  . Number of Children: N/A  . Years of Education: N/A   Occupational History  .  Lab [employer]   Social History Main Topics  . Smoking status: Former Smoker -- 1.00 packs/day for 25 years    Types: Cigarettes    Quit date: 06/25/2010  . Smokeless tobacco: Never Used  . Alcohol Use: No  . Drug Use: No  . Sexual Activity: Not on file   Other Topics Concern  . Not on file   Social History Narrative   Review of Systems  Constitutional: Negative for fatigue and unexpected weight change.       Tries to walk some Wears seat belt  HENT: Positive for tinnitus. Negative for dental problem and hearing loss.        Ringing since the chemo Keeps up with dentist  Eyes: Negative for visual disturbance.       No diplopia or unilateral vision loss Slight blurred vision  Respiratory: Positive for cough. Negative for chest tightness and shortness of breath.   Cardiovascular: Negative for chest pain, palpitations and leg  swelling.  Gastrointestinal: Negative for nausea, abdominal pain, constipation and blood in stool.  Endocrine: Positive for polydipsia. Negative for polyuria.       Drinks a lot of water Sugars were up at last check at [employer]  Genitourinary: Negative for dysuria, hematuria and dyspareunia.  Musculoskeletal: Negative for joint swelling and arthralgias.       Occ wrist pain--- on keyboard all day  Skin: Positive for rash.  Allergic/Immunologic: Positive for environmental allergies. Negative for immunocompromised state.  Neurological: Negative for dizziness, syncope, weakness, light-headedness and headaches.  Hematological: Negative for adenopathy. Does not bruise/bleed easily.  Psychiatric/Behavioral: Negative for dysphoric mood. The patient is not nervous/anxious.        Sleep is not great--but not tired in daytime         Objective:   Physical Exam  Constitutional: She is oriented to person, place, and time. She appears well-developed and well-nourished. No distress.  HENT:  Head: Normocephalic and atraumatic.  Right Ear: External ear normal.  Left Ear: External ear normal.  Mouth/Throat: Oropharynx is clear and moist. No oropharyngeal exudate.  Eyes: Conjunctivae are normal. Pupils are equal, round, and reactive to light.  Neck: Normal range of motion. Neck supple.  Cardiovascular: Normal rate, regular rhythm, normal heart sounds and intact distal pulses.  Exam reveals no gallop.   No murmur heard. Pulmonary/Chest: Effort normal and breath sounds normal. No respiratory distress. She has no wheezes. She has no rales.  Abdominal: Soft. There is no tenderness.  Musculoskeletal: She exhibits no edema or tenderness.  Lymphadenopathy:    She has no cervical adenopathy.  Neurological: She is alert and oriented to person, place, and time.  Skin:  Mild scalp flaking Slight redness on elbows  Psychiatric: She has a normal mood and affect. Her behavior is normal.            Assessment & Plan:

## 2016-03-06 NOTE — Patient Instructions (Signed)
You are due for your screening mammogram in November.

## 2016-03-06 NOTE — Assessment & Plan Note (Signed)
Due for mammo in November Colon due 2022 Pap due 2018 Discussed fitness UTD on imms---yearly flu

## 2016-03-06 NOTE — Assessment & Plan Note (Signed)
Okay with the prilosec ?cause of cough (vs thyroid nodule)

## 2016-03-07 ENCOUNTER — Other Ambulatory Visit: Payer: Self-pay | Admitting: Internal Medicine

## 2016-03-07 DIAGNOSIS — D509 Iron deficiency anemia, unspecified: Secondary | ICD-10-CM

## 2016-03-07 LAB — COMPREHENSIVE METABOLIC PANEL
ALBUMIN: 4.2 g/dL (ref 3.6–4.8)
ALK PHOS: 126 IU/L — AB (ref 39–117)
ALT: 12 IU/L (ref 0–32)
AST: 19 IU/L (ref 0–40)
Albumin/Globulin Ratio: 1.3 (ref 1.2–2.2)
BUN / CREAT RATIO: 14 (ref 12–28)
BUN: 10 mg/dL (ref 8–27)
Bilirubin Total: 0.3 mg/dL (ref 0.0–1.2)
CHLORIDE: 99 mmol/L (ref 96–106)
CO2: 24 mmol/L (ref 18–29)
Calcium: 9.1 mg/dL (ref 8.7–10.3)
Creatinine, Ser: 0.71 mg/dL (ref 0.57–1.00)
GFR calc Af Amer: 106 mL/min/{1.73_m2} (ref >59)
GFR calc non Af Amer: 92 mL/min/{1.73_m2} (ref >59)
Globulin, Total: 3.2 g/dL (ref 1.5–4.5)
Glucose: 94 mg/dL (ref 65–99)
Potassium: 4.4 mmol/L (ref 3.5–5.2)
SODIUM: 138 mmol/L (ref 134–144)
Total Protein: 7.4 g/dL (ref 6.0–8.5)

## 2016-03-07 LAB — CBC WITH DIFFERENTIAL/PLATELET
BASOS ABS: 0 10*3/uL (ref 0.0–0.2)
Basos: 0 %
EOS (ABSOLUTE): 0.4 10*3/uL (ref 0.0–0.4)
EOS: 4 %
HEMATOCRIT: 30.9 % — AB (ref 34.0–46.6)
Hemoglobin: 10 g/dL — ABNORMAL LOW (ref 11.1–15.9)
Immature Grans (Abs): 0 10*3/uL (ref 0.0–0.1)
Immature Granulocytes: 0 %
LYMPHS ABS: 2.3 10*3/uL (ref 0.7–3.1)
Lymphs: 26 %
MCH: 24 pg — AB (ref 26.6–33.0)
MCHC: 32.4 g/dL (ref 31.5–35.7)
MCV: 74 fL — ABNORMAL LOW (ref 79–97)
MONOS ABS: 0.7 10*3/uL (ref 0.1–0.9)
Monocytes: 8 %
NEUTROS ABS: 5.3 10*3/uL (ref 1.4–7.0)
Neutrophils: 62 %
Platelets: 355 10*3/uL (ref 150–379)
RBC: 4.17 x10E6/uL (ref 3.77–5.28)
RDW: 17.6 % — AB (ref 12.3–15.4)
WBC: 8.6 10*3/uL (ref 3.4–10.8)

## 2016-03-07 LAB — LIPID PANEL
CHOLESTEROL TOTAL: 228 mg/dL — AB (ref 100–199)
Chol/HDL Ratio: 9.1 ratio units — ABNORMAL HIGH (ref 0.0–4.4)
HDL: 25 mg/dL — ABNORMAL LOW (ref >39)
LDL CALC: 138 mg/dL — AB (ref 0–99)
Triglycerides: 323 mg/dL — ABNORMAL HIGH (ref 0–149)
VLDL Cholesterol Cal: 65 mg/dL — ABNORMAL HIGH (ref 5–40)

## 2016-03-07 LAB — HEMOGLOBIN A1C
ESTIMATED AVERAGE GLUCOSE: 126 mg/dL
HEMOGLOBIN A1C: 6 % — AB (ref 4.8–5.6)

## 2016-03-07 LAB — T4, FREE: Free T4: 1.06 ng/dL (ref 0.82–1.77)

## 2016-03-22 ENCOUNTER — Telehealth: Payer: Self-pay | Admitting: Internal Medicine

## 2016-03-22 DIAGNOSIS — Z1211 Encounter for screening for malignant neoplasm of colon: Secondary | ICD-10-CM

## 2016-03-22 NOTE — Telephone Encounter (Signed)
I do not recommend waiting but if she doesn't want to go to GI now---please have Terri or Tasha send her a FIT kit to do at home If that shows blood--she really needs to go back to Dr Fuller Plan for evaluation. It is important that she do the fecal test if she wants to hold off on GI

## 2016-03-22 NOTE — Telephone Encounter (Signed)
Spoke to pt about GI referral. She did not know why she was being referred back to Dr. Fuller Plan, that's why she did not make appt with his office when they called. I explained her lab results were released via mychart and that she had worsening anemia and that's why you wanted to send her to GI.  Pt would like to have her labs re-drawn before she see's Dr. Fuller Plan. She said she has been eating a lot more greens and red meat. Is that ok? Can she redraw labs?  Asked pt if she wanted me to de-activate her mychart due to her not getting results and she said "no" she will do better at looking at it.

## 2016-03-22 NOTE — Telephone Encounter (Signed)
Spoke to pt. She would like to start out with the FIT Kit. Terri, do you need orders from Dr Silvio Pate?

## 2016-03-22 NOTE — Telephone Encounter (Signed)
lmom for pt about doing the ifob. Lm for her to call and let me know if she wants it sent or will she pick it up, ifob ordered

## 2016-03-23 NOTE — Telephone Encounter (Signed)
ifob sent

## 2016-03-29 ENCOUNTER — Other Ambulatory Visit (INDEPENDENT_AMBULATORY_CARE_PROVIDER_SITE_OTHER): Payer: 59

## 2016-03-29 DIAGNOSIS — Z1211 Encounter for screening for malignant neoplasm of colon: Secondary | ICD-10-CM | POA: Diagnosis not present

## 2016-03-30 ENCOUNTER — Encounter: Payer: Self-pay | Admitting: Internal Medicine

## 2016-03-30 DIAGNOSIS — D649 Anemia, unspecified: Secondary | ICD-10-CM

## 2016-03-30 LAB — FECAL OCCULT BLOOD, IMMUNOCHEMICAL: Fecal Occult Bld: NEGATIVE

## 2016-03-30 NOTE — Telephone Encounter (Signed)
Spoke to Pt. Made lab appt for 04-10-16

## 2016-03-30 NOTE — Telephone Encounter (Signed)
Please call her to check the CBC again in the next 1-2 weeks

## 2016-04-10 ENCOUNTER — Other Ambulatory Visit (INDEPENDENT_AMBULATORY_CARE_PROVIDER_SITE_OTHER): Payer: 59

## 2016-04-10 DIAGNOSIS — D649 Anemia, unspecified: Secondary | ICD-10-CM | POA: Diagnosis not present

## 2016-04-11 ENCOUNTER — Encounter: Payer: Self-pay | Admitting: Internal Medicine

## 2016-04-11 LAB — CBC WITH DIFFERENTIAL/PLATELET
BASOS ABS: 0 10*3/uL (ref 0.0–0.2)
Basos: 0 %
EOS (ABSOLUTE): 0.4 10*3/uL (ref 0.0–0.4)
Eos: 5 %
HEMOGLOBIN: 10.2 g/dL — AB (ref 11.1–15.9)
Hematocrit: 32.8 % — ABNORMAL LOW (ref 34.0–46.6)
Immature Grans (Abs): 0 10*3/uL (ref 0.0–0.1)
Immature Granulocytes: 0 %
LYMPHS ABS: 2.4 10*3/uL (ref 0.7–3.1)
LYMPHS: 34 %
MCH: 23.8 pg — AB (ref 26.6–33.0)
MCHC: 31.1 g/dL — AB (ref 31.5–35.7)
MCV: 77 fL — ABNORMAL LOW (ref 79–97)
MONOCYTES: 7 %
Monocytes Absolute: 0.5 10*3/uL (ref 0.1–0.9)
NEUTROS ABS: 3.9 10*3/uL (ref 1.4–7.0)
Neutrophils: 54 %
PLATELETS: 354 10*3/uL (ref 150–379)
RBC: 4.28 x10E6/uL (ref 3.77–5.28)
RDW: 18.9 % — ABNORMAL HIGH (ref 12.3–15.4)
WBC: 7.2 10*3/uL (ref 3.4–10.8)

## 2016-04-12 ENCOUNTER — Encounter: Payer: Self-pay | Admitting: Internal Medicine

## 2016-04-16 ENCOUNTER — Other Ambulatory Visit: Payer: Self-pay | Admitting: Internal Medicine

## 2016-04-18 NOTE — Telephone Encounter (Signed)
She has an appt in January 2018.

## 2016-04-18 NOTE — Telephone Encounter (Signed)
As far as I know--they only come as 2.'5mg'$  Okay to refill for 6 months. I will want to see her again then due to the anemia--please set up an appointment

## 2016-05-08 ENCOUNTER — Encounter: Payer: Self-pay | Admitting: Internal Medicine

## 2016-05-08 ENCOUNTER — Telehealth: Payer: Self-pay

## 2016-05-08 MED ORDER — FLUOCINONIDE 0.05 % EX OINT: 1.0000 "application " | TOPICAL_OINTMENT | Freq: Two times a day (BID) | CUTANEOUS | 1 refills | Status: DC | PRN

## 2016-05-08 NOTE — Telephone Encounter (Signed)
Rx sent electronically.  

## 2016-05-09 ENCOUNTER — Other Ambulatory Visit: Payer: 59

## 2016-05-09 ENCOUNTER — Ambulatory Visit: Payer: 59 | Admitting: Oncology

## 2016-05-11 ENCOUNTER — Ambulatory Visit: Payer: 59 | Admitting: Internal Medicine

## 2016-05-24 ENCOUNTER — Other Ambulatory Visit: Payer: Self-pay | Admitting: Otolaryngology

## 2016-05-24 DIAGNOSIS — E041 Nontoxic single thyroid nodule: Secondary | ICD-10-CM

## 2016-05-31 ENCOUNTER — Ambulatory Visit
Admission: RE | Admit: 2016-05-31 | Discharge: 2016-05-31 | Disposition: A | Discharge status: Home / Self Care | Payer: 59 | Source: Ambulatory Visit | Attending: Otolaryngology | Admitting: Otolaryngology

## 2016-05-31 DIAGNOSIS — E041 Nontoxic single thyroid nodule: Secondary | ICD-10-CM | POA: Insufficient documentation

## 2016-05-31 NOTE — Procedures (Signed)
Under US guidance, FNA of right thyroid nodule was performed. No immediate complication.

## 2016-05-31 NOTE — Discharge Instructions (Signed)
Thyroid Biopsy The thyroid gland is a butterfly-shaped gland located in the front of the neck. It produces hormones that affect metabolism, growth and development, and body temperature. Thyroid biopsy is a procedure in which small samples of tissue or fluid are removed from the thyroid gland. The samples are then looked at under a microscope to check for abnormalities. This procedure is done to determine the cause of thyroid problems. It may be done to check for infection, cancer, or other thyroid problems. Two methods may be used for a thyroid biopsy. In one method, a thin needle is inserted through the skin and into the thyroid gland. In the other method, an open incision is made through the skin. LET [facility] CARE PROVIDER KNOW ABOUT:   Any allergies you have.  All medicines you are taking, including vitamins, herbs, eye drops, creams, and over-the-counter medicines.  Previous problems you or members of your family have had with the use of anesthetics.  Any blood disorders you have.  Previous surgeries you have had.  Medical conditions you have. RISKS AND COMPLICATIONS Generally, this is a safe procedure. However, problems can occur and include:  Bleeding from the procedure site.  Infection.  Injury to structures near the thyroid gland. BEFORE THE PROCEDURE   Ask your health care provider about:  Changing or stopping your regular medicines. This is especially important if you are taking diabetes medicines or blood thinners.  Taking medicines such as aspirin and ibuprofen. These medicines can thin your blood. Do not take these medicines before your procedure if your health care provider asks you not to.  Do not eat or drink anything after midnight on the night before the procedure or as directed by your health care provider.  You may have a blood sample taken. PROCEDURE Either of these methods may be used to perform a thyroid biopsy:  Fine needle biopsy. You may be given  medicine to help you relax (sedative). You will be asked to lie on your back with your head tipped backward to extend your neck. An area on your neck will be cleaned. A needle will then be inserted through the skin of your neck. You may be asked to avoid coughing, talking, swallowing, or making sounds during some portions of the procedure. The needle will be withdrawn once the tissue or fluid samples have been removed. Pressure may be applied to your neck to reduce swelling and ensure that bleeding has stopped. The samples will be sent to a lab for examination.  Open biopsy. You will be given medicine to make you sleep (general anesthetic). An incision will be made in your neck. A sample of thyroid tissue will be removed using surgical tools. The tissue sample will be sent for examination. In some cases, the sample may be examined during the biopsy. If that is done and cancer cells are found, some or all of the thyroid gland may be removed. The incision will be closed with stitches. AFTER THE PROCEDURE   Your recovery will be assessed and monitored.  You may have soreness and tenderness at the site of the biopsy. This should go away after a few days.  If you had an open biopsy, you may have a hoarse voice or sore throat for a couple days.  It is your responsibility to get your test results.   This information is not intended to replace advice given to you by your health care provider. Make sure you discuss any questions you have with your health  care provider.   Document Released: 07/09/2007 Document Revised: 10/02/2014 Document Reviewed: 12/04/2013 Elsevier Interactive Patient Education [employer].

## 2016-06-07 ENCOUNTER — Other Ambulatory Visit: Payer: Self-pay | Admitting: Internal Medicine

## 2016-06-07 DIAGNOSIS — Z1231 Encounter for screening mammogram for malignant neoplasm of breast: Secondary | ICD-10-CM

## 2016-06-23 ENCOUNTER — Encounter: Payer: Self-pay | Admitting: Internal Medicine

## 2016-06-26 LAB — CYTOLOGY - NON PAP

## 2016-06-27 ENCOUNTER — Telehealth: Payer: Self-pay | Admitting: Internal Medicine

## 2016-06-27 NOTE — Telephone Encounter (Signed)
Spoke to pt. Advised her what we have

## 2016-06-27 NOTE — Telephone Encounter (Signed)
I have not gotten anything but the original report from 9/6

## 2016-06-27 NOTE — Telephone Encounter (Signed)
Patient called to find out if Dr.Letvak received Biopsy results from Dr.Bennett.  Patient said if Dr.Letvak didn't receive the results, she'll call Dr.Bennett's office to get the results faxed.

## 2016-06-28 ENCOUNTER — Ambulatory Visit
Admission: RE | Admit: 2016-06-28 | Discharge: 2016-06-28 | Disposition: A | Discharge status: Home / Self Care | Payer: 59 | Source: Ambulatory Visit | Attending: Internal Medicine | Admitting: Internal Medicine

## 2016-06-28 ENCOUNTER — Other Ambulatory Visit: Payer: Self-pay | Admitting: Internal Medicine

## 2016-06-28 DIAGNOSIS — Z1231 Encounter for screening mammogram for malignant neoplasm of breast: Secondary | ICD-10-CM | POA: Diagnosis not present

## 2016-06-29 ENCOUNTER — Telehealth: Payer: Self-pay | Admitting: Internal Medicine

## 2016-06-29 ENCOUNTER — Encounter: Payer: Self-pay | Admitting: *Deleted

## 2016-06-29 DIAGNOSIS — E041 Nontoxic single thyroid nodule: Secondary | ICD-10-CM

## 2016-06-29 NOTE — Telephone Encounter (Signed)
Discussed with her after seeing the suspicious thyroid biopsy. Dr Richardson Landry has recommend partial thyroidectomy (which I think is appropriate). She wants a second opinion. I told her that general surgeons often do thyroid procedures--will set her up with Dr Jamal Collin

## 2016-07-05 ENCOUNTER — Encounter: Payer: Self-pay | Admitting: General Surgery

## 2016-07-05 ENCOUNTER — Ambulatory Visit (INDEPENDENT_AMBULATORY_CARE_PROVIDER_SITE_OTHER): Payer: 59 | Admitting: General Surgery

## 2016-07-05 VITALS — BP 132/74 | HR 74 | Resp 12 | Ht 65.0 in | Wt 161.0 lb

## 2016-07-05 DIAGNOSIS — E041 Nontoxic single thyroid nodule: Secondary | ICD-10-CM | POA: Diagnosis not present

## 2016-07-05 DIAGNOSIS — Z85118 Personal history of other malignant neoplasm of bronchus and lung: Secondary | ICD-10-CM | POA: Diagnosis not present

## 2016-07-05 NOTE — Patient Instructions (Signed)
Patient to have a ct chest scan and see Dr. Rogue Bussing.

## 2016-07-05 NOTE — Progress Notes (Signed)
Patient ID: Regina Grimes, female   DOB: [birth year], 65 y.o.   MRN: [id number]  Chief Complaint  Patient presents with  . Other    thyroid     HPI Regina Grimes is a 65 y.o. female here today for a evaluation of potential thyroidectomy. Patient had a ultrasound on 05/31/16. She has a dominant nodule in right lobe Denies any symptoms of pressure in neck area. She has had FNA and subsequent Afirma test showing atypia. Thyroidectomy was recommended. Pt desired a second opinion. She has had CA left lung and had lobectomy in 2013 I have reviewed the history of present illness with the patient.  HPI  Past Medical History:  Diagnosis Date  . Arthritis    arm  . Cancer [facility]) 10/2011   Left Lung cancer with Partial Lobectomy.  . Coughing up blood    10/2011, admitted to [place]   . GERD (gastroesophageal reflux disease)   . Hiatal hernia   . Hyperlipidemia   . Hypertension    treated for while in hosp. 10/2011, given med. , unsure of name of med., not told to continue   . Osteopenia   . Psoriasis    has used methotrexate since she was 65y.o., when psoriasis flares. Currently methotrexate on hold since 11/05/2011   . Recurrent upper respiratory infection (URI)    lung infection, treated /w antibiotic - 10/2011    Past Surgical History:  Procedure Laterality Date  . abnormal PAP     low grade intrepithelial lesion. Colposcipy okay  . APPENDECTOMY     1982- along /w tubal ligation   . Benign tumors of left foot  1992/1994  . BREAST BIOPSY Right 2013  . COLONOSCOPY  8/02  . ESOPHAGOGASTRODUODENOSCOPY  8/02  . FOOT NEUROMA SURGERY     L foot- 1990's  . TUBAL LIGATION  1982  . VD     x 2  . VIDEO BRONCHOSCOPY  11/16/2011   Procedure: VIDEO BRONCHOSCOPY WITH FLUORO;  Surgeon: Leanna Sato. Elsworth Soho, MD;  Location: [facility];  Service: Cardiopulmonary;  Laterality: Bilateral;  . VIDEO BRONCHOSCOPY  12/21/2011   Procedure: VIDEO BRONCHOSCOPY;  Surgeon: Gaye Pollack, MD;  Location: [facility]  OR;  Service: Thoracic;  Laterality: N/A;    Family History  Problem Relation Age of Onset  . Coronary artery disease Mother   . Heart disease Mother     atrial fibrillation  . Lung cancer Father   . Lung cancer Brother   . Breast cancer Paternal Aunt 34  . Cirrhosis Maternal Grandmother   . Anesthesia problems Neg Hx     Social History Social History  Substance Use Topics  . Smoking status: Former Smoker    Packs/day: 1.00    Years: 25.00    Types: Cigarettes    Quit date: 06/25/2010  . Smokeless tobacco: Never Used  . Alcohol use No    Allergies  Allergen Reactions  . Aspirin     REACTION: stomach burning  . Codeine     Upset stomach  . Pravastatin Sodium     REACTION: mood changes and snapping at everyone    Current Outpatient Prescriptions  Medication Sig Dispense Refill  . fluocinonide ointment (LIDEX) 8.46 % Apply 1 application topically 2 (two) times daily as needed. For psoriasis 30 g 1  . folic acid (FOLVITE) 1 MG tablet Take 1 mg by mouth daily.    . methotrexate (RHEUMATREX) 2.5 MG tablet Take 6 tablets by mouth once a  week.    . methotrexate (RHEUMATREX) 2.5 MG tablet TAKE SIX TABLETS BY MOUTH ONCE A WEEK 24 tablet 5  . omeprazole (PRILOSEC) 20 MG capsule Take 20 mg by mouth daily.     No current facility-administered medications for this visit.     Review of Systems Review of Systems  Constitutional: Negative.   Respiratory: Negative.   Cardiovascular: Negative.   Gastrointestinal: Negative.   Endocrine: Negative.   Genitourinary: Negative.     Blood pressure 132/74, pulse 74, resp. rate 12, height '5\' 5"'$  (1.651 m), weight 161 lb (73 kg).  Physical Exam Physical Exam  Constitutional: She is oriented to person, place, and time. She appears well-developed and well-nourished.  Eyes: Conjunctivae are normal. No scleral icterus.  Neck: Neck supple. No tracheal deviation present. Thyroid mass (ill defined smooth mass right lobe. ) present.   Cardiovascular: Normal rate, regular rhythm and normal heart sounds.   Pulmonary/Chest: Effort normal and breath sounds normal.  Lymphadenopathy:    She has no cervical adenopathy.  Neurological: She is alert and oriented to person, place, and time.  Skin: Skin is warm and dry.    Data Reviewed Labs, notes and ct scan reviewed   Assessment    Right thyroid nodule suspicious based on FNA and Afirma. Feel thyroidectomy is indicated based on these findings. She is due for chest CT as surveillance for lung CA. Would like this to be completed before any thyroid surgery.     Plan   Above impression and recommendation were explained fully to pt.     Patient to have a ct chest scan and see Dr. Rogue Bussing.  Patient has been scheduled for a CT chest with contrast at [place] for 07-11-16 at 1:30 pm(arrive 1:15 pm). Prep: liquids only 4 hours prior. Patient verbalizes understanding.    This information has been scribed by Gaspar Cola CMA.     Klani Caridi G 07/10/2016, 11:14 AM

## 2016-07-10 ENCOUNTER — Encounter: Payer: Self-pay | Admitting: General Surgery

## 2016-07-11 ENCOUNTER — Ambulatory Visit
Admission: RE | Admit: 2016-07-11 | Discharge: 2016-07-11 | Disposition: A | Discharge status: Home / Self Care | Payer: 59 | Source: Ambulatory Visit | Attending: General Surgery | Admitting: General Surgery

## 2016-07-11 DIAGNOSIS — J984 Other disorders of lung: Secondary | ICD-10-CM | POA: Diagnosis not present

## 2016-07-11 DIAGNOSIS — Z902 Acquired absence of lung [part of]: Secondary | ICD-10-CM | POA: Insufficient documentation

## 2016-07-11 DIAGNOSIS — Z85118 Personal history of other malignant neoplasm of bronchus and lung: Secondary | ICD-10-CM | POA: Diagnosis not present

## 2016-07-11 LAB — POCT I-STAT CREATININE: CREATININE: 0.6 mg/dL (ref 0.44–1.00)

## 2016-07-11 MED ORDER — IOPAMIDOL (ISOVUE-300) INJECTION 61%
75.0000 mL | Freq: Once | INTRAVENOUS | Status: AC | PRN
  Administered 2016-07-11: 75 mL via INTRAVENOUS

## 2016-08-01 ENCOUNTER — Inpatient Hospital Stay: Payer: 59

## 2016-08-01 ENCOUNTER — Inpatient Hospital Stay: Payer: 59 | Attending: Internal Medicine | Admitting: Internal Medicine

## 2016-08-01 VITALS — BP 205/95 | HR 69 | Temp 98.7°F | Resp 18 | Wt 162.7 lb

## 2016-08-01 DIAGNOSIS — Z801 Family history of malignant neoplasm of trachea, bronchus and lung: Secondary | ICD-10-CM | POA: Diagnosis not present

## 2016-08-01 DIAGNOSIS — Z803 Family history of malignant neoplasm of breast: Secondary | ICD-10-CM | POA: Diagnosis not present

## 2016-08-01 DIAGNOSIS — M129 Arthropathy, unspecified: Secondary | ICD-10-CM | POA: Diagnosis not present

## 2016-08-01 DIAGNOSIS — Z85118 Personal history of other malignant neoplasm of bronchus and lung: Secondary | ICD-10-CM

## 2016-08-01 DIAGNOSIS — I10 Essential (primary) hypertension: Secondary | ICD-10-CM | POA: Diagnosis not present

## 2016-08-01 DIAGNOSIS — Z23 Encounter for immunization: Secondary | ICD-10-CM | POA: Insufficient documentation

## 2016-08-01 DIAGNOSIS — M818 Other osteoporosis without current pathological fracture: Secondary | ICD-10-CM | POA: Diagnosis not present

## 2016-08-01 DIAGNOSIS — E041 Nontoxic single thyroid nodule: Secondary | ICD-10-CM | POA: Diagnosis present

## 2016-08-01 DIAGNOSIS — K219 Gastro-esophageal reflux disease without esophagitis: Secondary | ICD-10-CM | POA: Diagnosis not present

## 2016-08-01 DIAGNOSIS — Z9223 Personal history of estrogen therapy: Secondary | ICD-10-CM | POA: Diagnosis not present

## 2016-08-01 DIAGNOSIS — L409 Psoriasis, unspecified: Secondary | ICD-10-CM | POA: Diagnosis not present

## 2016-08-01 DIAGNOSIS — Z9049 Acquired absence of other specified parts of digestive tract: Secondary | ICD-10-CM | POA: Diagnosis not present

## 2016-08-01 DIAGNOSIS — Z79899 Other long term (current) drug therapy: Secondary | ICD-10-CM | POA: Diagnosis not present

## 2016-08-01 DIAGNOSIS — Z87891 Personal history of nicotine dependence: Secondary | ICD-10-CM | POA: Diagnosis not present

## 2016-08-01 DIAGNOSIS — J069 Acute upper respiratory infection, unspecified: Secondary | ICD-10-CM

## 2016-08-01 DIAGNOSIS — K449 Diaphragmatic hernia without obstruction or gangrene: Secondary | ICD-10-CM | POA: Insufficient documentation

## 2016-08-01 DIAGNOSIS — E785 Hyperlipidemia, unspecified: Secondary | ICD-10-CM | POA: Diagnosis not present

## 2016-08-01 MED ORDER — INFLUENZA VAC SPLIT QUAD 0.5 ML IM SUSY
0.5000 mL | PREFILLED_SYRINGE | Freq: Once | INTRAMUSCULAR | Status: AC
  Administered 2016-08-01: 0.5 mL via INTRAMUSCULAR

## 2016-08-01 NOTE — Progress Notes (Signed)
Patient is here for follow up, no complaints.  BP was high will recheck after visit   Would like her flu shot

## 2016-08-01 NOTE — Progress Notes (Signed)
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  Adjuvant chemotherapy starting in May of 2013 with cis-platinum and Taxol /carboplatinum and Taxol- finished April 10, 2012.     Adenocarcinoma of lung, stage 2 ([place])   11/24/2011 Initial Diagnosis    Adenocarcinoma of lung, stage 2        HISTORY OF PRESENTING ILLNESS:  Regina Grimes 65 y.o.  female previous history of stage IB- lung cancer noted to have right thyroid nodule- had FNA 2- atypia of undetermined significance; recent Affirma study suspicious for malignancy. Patient has been referred to [place] for further evaluation and recommendations.  Patient denies any unusual shortness of breath or cough. Denies any chest pain or shortness of breath. Denies any headaches. Denies any difficulty swallowing.  ROS: A complete 10 point review of system is done which is negative except mentioned above in history of present illness  MEDICAL HISTORY:  Past Medical History:  Diagnosis Date  . Arthritis    arm  . Cancer [facility]) 10/2011   Left Lung cancer with Partial Lobectomy.  . Coughing up blood    10/2011, admitted to [facility]   . GERD (gastroesophageal reflux disease)   . Hiatal hernia   . Hyperlipidemia   . Hypertension    treated for  while in hosp. 10/2011, given med. , unsure of name of med., not told to continue   . Osteopenia   . Psoriasis    has used methotrexate since she was 65y.o., when psoriasis flares. Currently methotrexate on hold since 11/05/2011   . Recurrent upper respiratory infection (URI)    lung infection, treated /w antibiotic - 10/2011    SURGICAL HISTORY: Past Surgical History:  Procedure Laterality Date  . abnormal PAP     low grade intrepithelial lesion. Colposcipy okay  . APPENDECTOMY     1982- along /w tubal ligation   . Benign tumors of left foot  1992/1994  . BREAST BIOPSY Right 2013  . COLONOSCOPY  8/02  . ESOPHAGOGASTRODUODENOSCOPY  8/02  . FOOT NEUROMA SURGERY     L foot- 1990's  . TUBAL LIGATION  1982  . VD     x 2  . VIDEO BRONCHOSCOPY  11/16/2011   Procedure: VIDEO BRONCHOSCOPY WITH FLUORO;  Surgeon: Leanna Sato. Elsworth Soho, MD;  Location: [facility];  Service: Cardiopulmonary;  Laterality: Bilateral;  . VIDEO BRONCHOSCOPY  12/21/2011   Procedure: VIDEO BRONCHOSCOPY;  Surgeon: Gaye Pollack, MD;  Location: [facility] OR;  Service: Thoracic;  Laterality: N/A;    SOCIAL HISTORY: Social History   Social History  . Marital status: Married    Spouse name: N/A  . Number of children: N/A  . Years of education: N/A   Occupational History  . Lab [employer]   Social History Main Topics  .  Smoking status: Former Smoker    Packs/day: 1.00    Years: 25.00    Types: Cigarettes    Quit date: 06/25/2010  . Smokeless tobacco: Never Used  . Alcohol use No  . Drug use: No  . Sexual activity: Not on file   Other Topics Concern  . Not on file   Social History Narrative  . No narrative on file    FAMILY HISTORY: Family History  Problem Relation Age of Onset  . Coronary artery disease Mother   . Heart disease Mother     atrial fibrillation  . Lung cancer Father   . Lung cancer Brother   . Breast cancer Paternal Aunt 49  . Cirrhosis Maternal Grandmother   . Anesthesia  problems Neg Hx     ALLERGIES:  is allergic to aspirin; codeine; and pravastatin sodium.  MEDICATIONS:  Current Outpatient Prescriptions  Medication Sig Dispense Refill  . fluocinonide ointment (LIDEX) 1.61 % Apply 1 application topically 2 (two) times daily as needed. For psoriasis 30 g 1  . folic acid (FOLVITE) 1 MG tablet Take 1 mg by mouth daily.    . methotrexate (RHEUMATREX) 2.5 MG tablet TAKE SIX TABLETS BY MOUTH ONCE A WEEK 24 tablet 5  . omeprazole (PRILOSEC) 20 MG capsule Take 20 mg by mouth daily.     No current facility-administered medications for this visit.       Marland Kitchen  PHYSICAL EXAMINATION: ECOG PERFORMANCE STATUS: 0 - Asymptomatic  Vitals:   08/01/16 0846  BP: (!) 205/95  Pulse: 69  Resp: 18  Temp: 98.7 F (37.1 C)   Filed Weights   08/01/16 0846  Weight: 162 lb 11.2 oz (73.8 kg)    GENERAL: Well-nourished well-developed; Alert, no distress and comfortable.   Alone. EYES: no pallor or icterus OROPHARYNX: no thrush or ulceration; good dentition  NECK: supple, positive for thyroid nodule on right  LYMPH:  no palpable lymphadenopathy in the cervical, axillary or inguinal regions LUNGS: clear to auscultation and  No wheeze or crackles HEART/CVS: regular rate & rhythm and no murmurs; No lower extremity edema ABDOMEN: abdomen soft, non-tender and normal bowel sounds Musculoskeletal:no cyanosis of digits and no clubbing  PSYCH: alert & oriented x 3 with fluent speech NEURO: no focal motor/sensory deficits SKIN:  no rashes or significant lesions  LABORATORY DATA:  I have reviewed the data as listed Lab Results  Component Value Date   WBC 7.2 04/10/2016   HGB 11.0 (L) 03/24/2015   HCT 32.8 (L) 04/10/2016   MCV 77 (L) 04/10/2016   PLT 354 04/10/2016    Recent Labs  03/06/16 1529 07/11/16 1341  NA 138  --   K 4.4  --   CL 99  --   CO2 24  --   GLUCOSE 94  --   BUN 10  --   CREATININE 0.71 0.60  CALCIUM 9.1  --   GFRNONAA 92  --   GFRAA 106  --    PROT 7.4  --   ALBUMIN 4.2  --   AST 19  --   ALT 12  --   ALKPHOS 126*  --   BILITOT 0.3  --     RADIOGRAPHIC STUDIES: I have personally reviewed the radiological images as listed and agreed with the findings in the report. Ct Chest W Contrast  Result Date: 07/11/2016 CLINICAL DATA:  History of left lung cancer with left lower lobectomy 5 years ago. Subsequent chemotherapy. Asymptomatic presently. EXAM: CT CHEST  WITH CONTRAST TECHNIQUE: Multidetector CT imaging of the chest was performed during intravenous contrast administration. CONTRAST:  72m ISOVUE-300 IOPAMIDOL (ISOVUE-300) INJECTION 61% COMPARISON:  03/30/2015 FINDINGS: Cardiovascular: Aortic and coronary artery atherosclerosis. Mediastinum/Nodes: No enlarged hilar or mediastinal lymph nodes. Small mediastinal nodes as seen previously are within normal limits. 15 mm right thyroid nodule is unchanged Lungs/Pleura: Previous left lower lobectomy. Stable benign subpleural nodules at the apices are unchanged. No evidence of developing mass. No infiltrate or collapse. No pleural effusion. Upper Abdomen: Small hiatal hernia up. Fatty change of the liver. No adrenal mass. Musculoskeletal: No acute finding. Minimal superior endplate deformities in the mid thoracic region as seen previously. IMPRESSION: Stable follow-up exam.  No evidence of recurrent or new malignancy. Previous left lower lobectomy. Benign appearing scarring at both lung apices. Electronically Signed   By: MNelson ChimesM.D.   On: 07/11/2016 15:50    ASSESSMENT & PLAN:   Right thyroid nodule # Right thyroid nodule 2.5 cm in size at least since 2013; mildly PET avid. FNA 2- atypical cells of undetermined significance; Affirma test- suspicious for malignancy. Recommend surgical evaluation/resection. Patient agrees with the plan. She will follow up with Dr. SOrma Render # Stage I B lung cancer status post adjuvant chemotherapy. Clinically no evidence of recurrence on most  recent CT scan.  # Follow-up with me in approximately 6-8 weeks.   Thank you Dr.Sankar for allowing me to participate in the care of your pleasant patient. Please do not hesitate to contact me with questions or concerns in the interim.   All questions were answered. The patient knows to call the clinic with any problems, questions or concerns.    GCammie Sickle MD 08/01/2016 5:03 PM

## 2016-08-01 NOTE — Assessment & Plan Note (Signed)
#   Right thyroid nodule 2.5 cm in size at least since 2013; mildly PET avid. FNA 2- atypical cells of undetermined significance; Affirma test- suspicious for malignancy. Recommend surgical evaluation/resection. Patient agrees with the plan. She will follow up with Dr. Orma Render  # Stage I B lung cancer status post adjuvant chemotherapy. Clinically no evidence of recurrence on most recent CT scan.  # Follow-up with me in approximately 6-8 weeks.   Thank you Dr.Sankar for allowing me to participate in the care of your pleasant patient. Please do not hesitate to contact me with questions or concerns in the interim.

## 2016-08-02 ENCOUNTER — Encounter: Payer: Self-pay | Admitting: Internal Medicine

## 2016-08-02 NOTE — Telephone Encounter (Signed)
PLEASE NOTE: All timestamps contained within this report are represented as [place] Standard Time. CONFIDENTIALTY NOTICE: This fax transmission is intended only for the addressee. It contains information that is legally privileged, confidential or otherwise protected from use or disclosure. If you are not the intended recipient, you are strictly prohibited from reviewing, disclosing, copying using or disseminating any of this information or taking any action in reliance on or regarding this information. If you have received this fax in error, please notify us immediately by telephone so that we can arrange for its return to [place]. Phone: [phone], Toll-Free: [phone], Fax: [phone] Page: 1 of 1 Call Id: [id number] [place] [facility] - Client Nonclinical Telephone Record [facility] - Client Client Site [place] - Day Physician Viviana Simpler - MD Contact Type Call Who Is Calling Patient / Member / Family / Caregiver Caller Name Point [place] Phone Number [phone] Patient Name Regina Grimes Call Type Message Only Information Provided Reason for Call Request for General Office Information Initial Comment Caller states she seen cancer MD yesterday, BP 203/45 then 205/101. Before she left manual was 175/101, this morning she woke up with a headache at 6am: 209/105. She is at work now, and has no idea what it is now. She states if she needs to come in she will. Additional Comment Call Closed By: Rosana Fret Transaction Date/Time: 08/02/2016 9:52:42 AM (ET)

## 2016-08-02 NOTE — Telephone Encounter (Signed)
Please get her in with someone today or tomorrow. Thanks

## 2016-08-03 ENCOUNTER — Ambulatory Visit (INDEPENDENT_AMBULATORY_CARE_PROVIDER_SITE_OTHER): Payer: 59 | Admitting: Family Medicine

## 2016-08-03 ENCOUNTER — Encounter: Payer: Self-pay | Admitting: Family Medicine

## 2016-08-03 VITALS — BP 170/100 | HR 75 | Temp 98.3°F | Wt 165.0 lb

## 2016-08-03 DIAGNOSIS — E784 Other hyperlipidemia: Secondary | ICD-10-CM

## 2016-08-03 DIAGNOSIS — E785 Hyperlipidemia, unspecified: Secondary | ICD-10-CM

## 2016-08-03 DIAGNOSIS — I10 Essential (primary) hypertension: Secondary | ICD-10-CM | POA: Diagnosis not present

## 2016-08-03 MED ORDER — AMLODIPINE BESYLATE 5 MG PO TABS: 5.0000 mg | ORAL_TABLET | Freq: Every day | ORAL | 4 refills | Status: DC

## 2016-08-03 NOTE — Progress Notes (Signed)
Subjective:    Patient ID: Regina Grimes, female    DOB: 11-Aug-1951, 65 y.o.   MRN: [id number]  HPI This is a 65 yo female who presents today for follow up of elevated blood pressure. She was seen two days ago at the cancer center and her blood pressure was 203/45, 205/101, 175/101. She had a headache on the top left side of her head that was relieved with acetaminophen and aspirin. No headache this morning. No palpitations or chest pain. No visual changes. She is very stressed due to taking care of her elderly mother and mother in law and she has recently learned that she will have to have thyroid surgery (scheduled for next month). She and her husband have been eating out more frequently. She has a dog and enjoys walking, but has had little time for exercise lately. She had a chest CT 07/11/16 that showed aortic and coronary atherosclerosis. Her mother had MI when in her 73s. HgbA1c 6/17- 6.0. Lipid panel showed elevated total, LDL, ratio, low HDL, high triglycerides. She would prefer not to go on prescription medication.   Past Medical History:  Diagnosis Date  . Arthritis    arm  . Cancer [facility]) 10/2011   Left Lung cancer with Partial Lobectomy.  . Coughing up blood    10/2011, admitted to [place]   . GERD (gastroesophageal reflux disease)   . Hiatal hernia   . Hyperlipidemia   . Hypertension    treated for while in hosp. 10/2011, given med. , unsure of name of med., not told to continue   . Osteopenia   . Psoriasis    has used methotrexate since she was 65y.o., when psoriasis flares. Currently methotrexate on hold since 11/05/2011   . Recurrent upper respiratory infection (URI)    lung infection, treated /w antibiotic - 10/2011   Past Surgical History:  Procedure Laterality Date  . abnormal PAP     low grade intrepithelial lesion. Colposcipy okay  . APPENDECTOMY     1982- along /w tubal ligation   . Benign tumors of left foot  1992/1994  . BREAST BIOPSY Right 2013  .  COLONOSCOPY  8/02  . ESOPHAGOGASTRODUODENOSCOPY  8/02  . FOOT NEUROMA SURGERY     L foot- 1990's  . TUBAL LIGATION  1982  . VD     x 2  . VIDEO BRONCHOSCOPY  11/16/2011   Procedure: VIDEO BRONCHOSCOPY WITH FLUORO;  Surgeon: Leanna Sato. Elsworth Soho, MD;  Location: [facility];  Service: Cardiopulmonary;  Laterality: Bilateral;  . VIDEO BRONCHOSCOPY  12/21/2011   Procedure: VIDEO BRONCHOSCOPY;  Surgeon: Gaye Pollack, MD;  Location: [facility] OR;  Service: Thoracic;  Laterality: N/A;   Family History  Problem Relation Age of Onset  . Coronary artery disease Mother   . Heart disease Mother     atrial fibrillation  . Lung cancer Father   . Lung cancer Brother   . Breast cancer Paternal Aunt 71  . Cirrhosis Maternal Grandmother   . Anesthesia problems Neg Hx    Social History  Substance Use Topics  . Smoking status: Former Smoker    Packs/day: 1.00    Years: 25.00    Types: Cigarettes    Quit date: 06/25/2010  . Smokeless tobacco: Never Used  . Alcohol use No      Review of Systems Per HPI    Objective:   Physical Exam  Constitutional: She is oriented to person, place, and time. She appears well-developed and well-nourished.  No distress.  HENT:  Head: Normocephalic and atraumatic.  Eyes: Conjunctivae are normal.  Neck: Normal range of motion. Neck supple.  Cardiovascular: Normal rate, regular rhythm and normal heart sounds.   Pulmonary/Chest: Effort normal and breath sounds normal.  Musculoskeletal: She exhibits no edema.  Neurological: She is alert and oriented to person, place, and time.  Skin: Skin is warm and dry. She is not diaphoretic.  Psychiatric: She has a normal mood and affect. Her behavior is normal. Judgment and thought content normal.  Vitals reviewed.     BP (!) 170/100 (BP Location: Left Arm, Patient Position: Sitting, Cuff Size: Normal)   Pulse 75   Temp 98.3 F (36.8 C) (Oral)   Wt 165 lb (74.8 kg)   SpO2 98%   BMI 27.46 kg/m  Wt Readings from Last 3  Encounters:  08/03/16 165 lb (74.8 kg)  08/01/16 162 lb 11.2 oz (73.8 kg)  07/05/16 161 lb (73 kg)   Recheck blood pressure 164/98 EKG- unchanged from 2013, Sinus rhythm with 1 pac, non specific ST depression    Assessment & Plan:  1. Essential hypertension - EKG 12-Lead - amLODipine (NORVASC) 5 MG tablet; Take 1 tablet (5 mg total) by mouth daily.  Dispense: 30 tablet; Refill: 4 - CBC with Differential/Platelet - Comprehensive metabolic panel - TSH - Patient was given information regarding amlodipine as well as instructions for home blood pressure monitoring, she will send me a log of blood pressure readings from the next 2 weeks - follow up in 6-8 weeks, sooner if no improvement in blood pressure or if she develops headaches or visual changes.   2. Dyslipidemia with elevated low density lipoprotein (LDL) cholesterol and abnormally low high density lipoprotein cholesterol - discussed her increased risk given lipid levels and CT findings of atherosclerosis, she does not want to take a statin or prescription medication. Agreed to try fish oil after her surgery next month - encouraged her to limit restaurant food, saturated fats and to increase exercise and vegetable/fruit intake   Clarene Reamer, FNP-BC  [facility], [place]  08/03/2016 10:19 AM

## 2016-08-03 NOTE — Patient Instructions (Signed)
Please take your blood pressure 3-4 times a week for the next two weeks and send me the readings via mychart Please start an over the counter fish oil supplement and increase your intake of vegetables and fruits. Try to limit eating out and foods high in saturated fats (full fat dairy, fried foods, fatty meats)   Amlodipine tablets What is this medicine? AMLODIPINE (am LOE di peen) is a calcium-channel blocker. It affects the amount of calcium found in your heart and muscle cells. This relaxes your blood vessels, which can reduce the amount of work the heart has to do. This medicine is used to lower high blood pressure. It is also used to prevent chest pain. This medicine may be used for other purposes; ask your health care provider or pharmacist if you have questions. What should I tell my health care provider before I take this medicine? They need to know if you have any of these conditions: -heart problems like heart failure or aortic stenosis -liver disease -an unusual or allergic reaction to amlodipine, other medicines, foods, dyes, or preservatives -pregnant or trying to get pregnant -breast-feeding How should I use this medicine? Take this medicine by mouth with a glass of water. Follow the directions on the prescription label. Take your medicine at regular intervals. Do not take more medicine than directed. Talk to your pediatrician regarding the use of this medicine in children. Special care may be needed. This medicine has been used in children as young as 6. Persons over 38 years old may have a stronger reaction to this medicine and need smaller doses. Overdosage: If you think you have taken too much of this medicine contact a poison control center or emergency room at once. NOTE: This medicine is only for you. Do not share this medicine with others. What if I miss a dose? If you miss a dose, take it as soon as you can. If it is almost time for your next dose, take only that dose. Do  not take double or extra doses. What may interact with this medicine? -herbal or dietary supplements -local or general anesthetics -medicines for high blood pressure -medicines for prostate problems -rifampin This list may not describe all possible interactions. Give your health care provider a list of all the medicines, herbs, non-prescription drugs, or dietary supplements you use. Also tell them if you smoke, drink alcohol, or use illegal drugs. Some items may interact with your medicine. What should I watch for while using this medicine? Visit your doctor or health care professional for regular check ups. Check your blood pressure and pulse rate regularly. Ask your health care professional what your blood pressure and pulse rate should be, and when you should contact him or her. This medicine may make you feel confused, dizzy or lightheaded. Do not drive, use machinery, or do anything that needs mental alertness until you know how this medicine affects you. To reduce the risk of dizzy or fainting spells, do not sit or stand up quickly, especially if you are an older patient. Avoid alcoholic drinks; they can make you more dizzy. Do not suddenly stop taking amlodipine. Ask your doctor or health care professional how you can gradually reduce the dose. What side effects may I notice from receiving this medicine? Side effects that you should report to your doctor or health care professional as soon as possible: -allergic reactions like skin rash, itching or hives, swelling of the face, lips, or tongue -breathing problems -changes in vision or hearing -  chest pain -fast, irregular heartbeat -swelling of legs or ankles Side effects that usually do not require medical attention (report to your doctor or health care professional if they continue or are bothersome): -dry mouth -facial flushing -nausea, vomiting -stomach gas, pain -tired, weak -trouble sleeping This list may not describe all  possible side effects. Call your doctor for medical advice about side effects. You may report side effects to FDA at 1-800-FDA-1088. Where should I keep my medicine? Keep out of the reach of children. Store at room temperature between 59 and 86 degrees F (15 and 30 degrees C). Protect from light. Keep container tightly closed. Throw away any unused medicine after the expiration date. NOTE: This sheet is a summary. It may not cover all possible information. If you have questions about this medicine, talk to your doctor, pharmacist, or health care provider.    2016, Elsevier/Gold Standard. (2012-08-09 11:40:58)   How to Take Your Blood Pressure HOW DO I GET A BLOOD PRESSURE MACHINE?  You can buy an electronic home blood pressure machine at your local pharmacy. Insurance will sometimes cover the cost if you have a prescription.  Ask your doctor what type of machine is best for you. There are different machines for your arm and your wrist.  If you decide to buy a machine to check your blood pressure on your arm, first check the size of your arm so you can buy the right size cuff. To check the size of your arm:   Use a measuring tape that shows both inches and centimeters.   Wrap the measuring tape around the upper-middle part of your arm. You may need someone to help you measure.   Write down your arm measurement in both inches and centimeters.   To measure your blood pressure correctly, it is important to have the right size cuff.   If your arm is up to 13 inches (up to 34 centimeters), get an adult cuff size.  If your arm is 13 to 17 inches (35 to 44 centimeters), get a large adult cuff size.    If your arm is 17 to 20 inches (45 to 52 centimeters), get an adult thigh cuff.  WHAT DO THE NUMBERS MEAN?   There are two numbers that make up your blood pressure. For example: 120/80.  The first number (120 in our example) is called the "systolic pressure." It is a measure of the  pressure in your blood vessels when your heart is pumping blood.  The second number (80 in our example) is called the "diastolic pressure." It is a measure of the pressure in your blood vessels when your heart is resting between beats.  Your doctor will tell you what your blood pressure should be. WHAT SHOULD I DO BEFORE I CHECK MY BLOOD PRESSURE?   Try to rest or relax for at least 30 minutes before you check your blood pressure.  Do not smoke.  Do not have any drinks with caffeine, such as:  Soda.  Coffee.  Tea.  Check your blood pressure in a quiet room.  Sit down and stretch out your arm on a table. Keep your arm at about the level of your heart. Let your arm relax.  Make sure that your legs are not crossed. HOW DO I CHECK MY BLOOD PRESSURE?  Follow the directions that came with your machine.  Make sure you remove any tight-fitting clothing from your arm or wrist. Wrap the cuff around your upper arm or wrist.  You should be able to fit a finger between the cuff and your arm. If you cannot fit a finger between the cuff and your arm, it is too tight and should be removed and rewrapped.  Some units require you to manually pump up the arm cuff.  Automatic units inflate the cuff when you press a button.  Cuff deflation is automatic in both models.  After the cuff is inflated, the unit measures your blood pressure and pulse. The readings are shown on a monitor. Hold still and breathe normally while the cuff is inflated.  Getting a reading takes less than a minute.  Some models store readings in a memory. Some provide a printout of readings. If your machine does not store your readings, keep a written record.  Take readings with you to your next visit with your doctor.   This information is not intended to replace advice given to you by your health care provider. Make sure you discuss any questions you have with your health care provider.   Document Released: 08/24/2008  Document Revised: 10/02/2014 Document Reviewed: 11/06/2013 Elsevier Interactive Patient Education 2016 [employer]. Hypertension Hypertension, commonly called high blood pressure, is when the force of blood pumping through your arteries is too strong. Your arteries are the blood vessels that carry blood from your heart throughout your body. A blood pressure reading consists of a higher number over a lower number, such as 110/72. The higher number (systolic) is the pressure inside your arteries when your heart pumps. The lower number (diastolic) is the pressure inside your arteries when your heart relaxes. Ideally you want your blood pressure below 120/80. Hypertension forces your heart to work harder to pump blood. Your arteries may become narrow or stiff. Having untreated or uncontrolled hypertension can cause heart attack, stroke, kidney disease, and other problems. RISK FACTORS Some risk factors for high blood pressure are controllable. Others are not.  Risk factors you cannot control include:   Race. You may be at higher risk if you are African American.  Age. Risk increases with age.  Gender. Men are at higher risk than women before age 43 years. After age 13, women are at higher risk than men. Risk factors you can control include:  Not getting enough exercise or physical activity.  Being overweight.  Getting too much fat, sugar, calories, or salt in your diet.  Drinking too much alcohol. SIGNS AND SYMPTOMS Hypertension does not usually cause signs or symptoms. Extremely high blood pressure (hypertensive crisis) may cause headache, anxiety, shortness of breath, and nosebleed. DIAGNOSIS To check if you have hypertension, your health care provider will measure your blood pressure while you are seated, with your arm held at the level of your heart. It should be measured at least twice using the same arm. Certain conditions can cause a difference in blood pressure between your right and  left arms. A blood pressure reading that is higher than normal on one occasion does not mean that you need treatment. If it is not clear whether you have high blood pressure, you may be asked to return on a different day to have your blood pressure checked again. Or, you may be asked to monitor your blood pressure at home for 1 or more weeks. TREATMENT Treating high blood pressure includes making lifestyle changes and possibly taking medicine. Living a healthy lifestyle can help lower high blood pressure. You may need to change some of your habits. Lifestyle changes may include:  Following the DASH diet.  This diet is high in fruits, vegetables, and whole grains. It is low in salt, red meat, and added sugars.  Keep your sodium intake below 2,300 mg per day.  Getting at least 30-45 minutes of aerobic exercise at least 4 times per week.  Losing weight if necessary.  Not smoking.  Limiting alcoholic beverages.  Learning ways to reduce stress. Your health care provider may prescribe medicine if lifestyle changes are not enough to get your blood pressure under control, and if one of the following is true:  You are 54-36 years of age and your systolic blood pressure is above 140.  You are 59 years of age or older, and your systolic blood pressure is above 150.  Your diastolic blood pressure is above 90.  You have diabetes, and your systolic blood pressure is over 569 or your diastolic blood pressure is over 90.  You have kidney disease and your blood pressure is above 140/90.  You have heart disease and your blood pressure is above 140/90. Your personal target blood pressure may vary depending on your medical conditions, your age, and other factors. HOME CARE INSTRUCTIONS  Have your blood pressure rechecked as directed by your health care provider.   Take medicines only as directed by your health care provider. Follow the directions carefully. Blood pressure medicines must be taken as  prescribed. The medicine does not work as well when you skip doses. Skipping doses also puts you at risk for problems.  Do not smoke.   Monitor your blood pressure at home as directed by your health care provider. SEEK MEDICAL CARE IF:   You think you are having a reaction to medicines taken.  You have recurrent headaches or feel dizzy.  You have swelling in your ankles.  You have trouble with your vision. SEEK IMMEDIATE MEDICAL CARE IF:  You develop a severe headache or confusion.  You have unusual weakness, numbness, or feel faint.  You have severe chest or abdominal pain.  You vomit repeatedly.  You have trouble breathing. MAKE SURE YOU:   Understand these instructions.  Will watch your condition.  Will get help right away if you are not doing well or get worse.   This information is not intended to replace advice given to you by your health care provider. Make sure you discuss any questions you have with your health care provider.   Document Released: 09/11/2005 Document Revised: 01/26/2015 Document Reviewed: 07/04/2013 Elsevier Interactive Patient Education [employer].

## 2016-08-03 NOTE — Progress Notes (Signed)
Pre visit review using our clinic review tool, if applicable. No additional management support is needed unless otherwise documented below in the visit note. 

## 2016-08-04 LAB — CBC WITH DIFFERENTIAL/PLATELET
BASOS: 0 %
Basophils Absolute: 0 10*3/uL (ref 0.0–0.2)
EOS (ABSOLUTE): 0.5 10*3/uL — AB (ref 0.0–0.4)
Eos: 6 %
HEMATOCRIT: 33.5 % — AB (ref 34.0–46.6)
HEMOGLOBIN: 10.8 g/dL — AB (ref 11.1–15.9)
IMMATURE GRANS (ABS): 0 10*3/uL (ref 0.0–0.1)
Immature Granulocytes: 0 %
LYMPHS: 24 %
Lymphocytes Absolute: 1.9 10*3/uL (ref 0.7–3.1)
MCH: 25.8 pg — AB (ref 26.6–33.0)
MCHC: 32.2 g/dL (ref 31.5–35.7)
MCV: 80 fL (ref 79–97)
MONOCYTES: 8 %
Monocytes Absolute: 0.6 10*3/uL (ref 0.1–0.9)
NEUTROS ABS: 4.9 10*3/uL (ref 1.4–7.0)
Neutrophils: 62 %
Platelets: 289 10*3/uL (ref 150–379)
RBC: 4.18 x10E6/uL (ref 3.77–5.28)
RDW: 17.7 % — ABNORMAL HIGH (ref 12.3–15.4)
WBC: 7.9 10*3/uL (ref 3.4–10.8)

## 2016-08-04 LAB — COMPREHENSIVE METABOLIC PANEL
A/G RATIO: 1.7 (ref 1.2–2.2)
ALBUMIN: 4.5 g/dL (ref 3.6–4.8)
ALT: 17 IU/L (ref 0–32)
AST: 21 IU/L (ref 0–40)
Alkaline Phosphatase: 112 IU/L (ref 39–117)
BILIRUBIN TOTAL: 0.3 mg/dL (ref 0.0–1.2)
BUN / CREAT RATIO: 19 (ref 12–28)
BUN: 12 mg/dL (ref 8–27)
CHLORIDE: 101 mmol/L (ref 96–106)
CO2: 28 mmol/L (ref 18–29)
Calcium: 9.1 mg/dL (ref 8.7–10.3)
Creatinine, Ser: 0.63 mg/dL (ref 0.57–1.00)
GFR calc non Af Amer: 96 mL/min/{1.73_m2} (ref >59)
GFR, EST AFRICAN AMERICAN: 110 mL/min/{1.73_m2} (ref >59)
GLOBULIN, TOTAL: 2.7 g/dL (ref 1.5–4.5)
Glucose: 113 mg/dL — ABNORMAL HIGH (ref 65–99)
POTASSIUM: 4.5 mmol/L (ref 3.5–5.2)
Sodium: 144 mmol/L (ref 134–144)
TOTAL PROTEIN: 7.2 g/dL (ref 6.0–8.5)

## 2016-08-04 LAB — TSH: TSH: 1.05 u[IU]/mL (ref 0.450–4.500)

## 2016-08-12 ENCOUNTER — Encounter: Payer: Self-pay | Admitting: Family Medicine

## 2016-08-14 ENCOUNTER — Encounter: Payer: Self-pay | Admitting: Family Medicine

## 2016-08-21 ENCOUNTER — Encounter: Payer: Self-pay | Admitting: Family Medicine

## 2016-08-22 ENCOUNTER — Other Ambulatory Visit: Payer: Self-pay | Admitting: Family Medicine

## 2016-08-22 DIAGNOSIS — I10 Essential (primary) hypertension: Secondary | ICD-10-CM

## 2016-08-22 MED ORDER — AMLODIPINE BESYLATE 10 MG PO TABS: 10.0000 mg | ORAL_TABLET | Freq: Every day | ORAL | 2 refills | Status: DC

## 2016-08-25 ENCOUNTER — Encounter: Payer: Self-pay | Admitting: Family Medicine

## 2016-08-29 ENCOUNTER — Encounter
Admission: RE | Admit: 2016-08-29 | Discharge: 2016-08-29 | Disposition: A | Discharge status: Home / Self Care | Payer: 59 | Source: Ambulatory Visit | Attending: Otolaryngology | Admitting: Otolaryngology

## 2016-08-29 DIAGNOSIS — Z01818 Encounter for other preprocedural examination: Secondary | ICD-10-CM | POA: Insufficient documentation

## 2016-08-29 DIAGNOSIS — D440 Neoplasm of uncertain behavior of thyroid gland: Secondary | ICD-10-CM | POA: Diagnosis not present

## 2016-08-29 NOTE — Patient Instructions (Signed)
Your procedure is scheduled on: Wednesday 09/06/16 Report to [facility]. 2ND FLOOR MEDICAL MALL ENTRANCE. To find out your arrival time please call [phone] between 1PM - 3PM on Tuesday 09/05/16.  Remember: Instructions that are not followed completely may result in serious medical risk, up to and including death, or upon the discretion of your surgeon and anesthesiologist your surgery may need to be rescheduled.    __X__ 1. Do not eat food or drink liquids after midnight. No gum chewing or hard candies.     __X__ 2. No Alcohol for 24 hours before or after surgery.   ____ 3. Bring all medications with you on the day of surgery if instructed.    __X__ 4. Notify your doctor if there is any change in your medical condition     (cold, fever, infections).             __X___5. No smoking within 24 hours of your surgery.     Do not wear jewelry, make-up, hairpins, clips or nail polish.  Do not wear lotions, powders, or perfumes.   Do not shave 48 hours prior to surgery. Men may shave face and neck.  Do not bring valuables to the hospital.    [facility] is not responsible for any belongings or valuables.               Contacts, dentures or bridgework may not be worn into surgery.  Leave your suitcase in the car. After surgery it may be brought to your room.  For patients admitted to the hospital, discharge time is determined by your                treatment team.   Patients discharged the day of surgery will not be allowed to drive home.    Please read over the following fact sheets that you were given:   Pain Booklet and MRSA Information CHG SOAP   __X__ Take these medicines the morning of surgery with A SIP OF WATER:    1. AMLODIPINE  2. PRILOSEC TAKE NIGHT BEFORE AND MORNING OF SURGERY   3.   4.  5.  6.  ____ Fleet Enema (as directed)   __X__ Use CHG Soap as directed  ____ Use inhalers on the day of surgery  ____ Stop metformin 2 days prior to surgery    ____ Take  1/2 of usual insulin dose the night before surgery and none on the morning of surgery.   ____ Stop Coumadin/Plavix/aspirin on   __X__ Stop Anti-inflammatories such as Advil, Aleve, Ibuprofen, Motrin, Naproxen, Naprosyn, Goodies,powder, or aspirin products.  OK to take Tylenol.   ____ Stop supplements until after surgery.    ____ Bring C-Pap to the hospital.

## 2016-08-31 ENCOUNTER — Ambulatory Visit (INDEPENDENT_AMBULATORY_CARE_PROVIDER_SITE_OTHER): Payer: 59 | Admitting: Family Medicine

## 2016-08-31 ENCOUNTER — Encounter: Payer: Self-pay | Admitting: Family Medicine

## 2016-08-31 ENCOUNTER — Encounter: Payer: Self-pay | Admitting: Internal Medicine

## 2016-08-31 VITALS — BP 142/82 | HR 81 | Temp 98.1°F | Wt 167.0 lb

## 2016-08-31 DIAGNOSIS — I10 Essential (primary) hypertension: Secondary | ICD-10-CM | POA: Diagnosis not present

## 2016-08-31 DIAGNOSIS — R600 Localized edema: Secondary | ICD-10-CM

## 2016-08-31 MED ORDER — LISINOPRIL 10 MG PO TABS: 10.0000 mg | ORAL_TABLET | Freq: Every day | ORAL | 1 refills | Status: DC

## 2016-08-31 NOTE — Progress Notes (Signed)
Pre visit review using our clinic review tool, if applicable. No additional management support is needed unless otherwise documented below in the visit note. 

## 2016-08-31 NOTE — Progress Notes (Signed)
Subjective:    Patient ID: Regina Grimes, female    DOB: 26-Apr-1951, 66 y.o.   MRN: [id number]  HPI This is a pleasant 65 yo female who presents today with swelling of her ankles. Was started on amlodipine 08/03/16 with improvement of blood pressure. Has noticed swelling of feet and ankles which has gotten worse over the last couple of days with worsening throughout the day. Pain with swelling. Goes down at night. No chest pain or SOB. Has thyroidectomy scheduled next week. Did not take amlodipine this morning.   Past Medical History:  Diagnosis Date  . Arthritis    arm  . Cancer [facility]) 10/2011   Left Lung cancer with Partial Lobectomy.  . Coughing up blood    10/2011, admitted to [facility]   . GERD (gastroesophageal reflux disease)   . Hiatal hernia   . Hyperlipidemia   . Hypertension    treated for while in hosp. 10/2011, given med. , unsure of name of med., not told to continue   . Osteopenia   . Psoriasis    has used methotrexate since she was 65y.o., when psoriasis flares. Currently methotrexate on hold since 11/05/2011   . Recurrent upper respiratory infection (URI)    lung infection, treated /w antibiotic - 10/2011   Past Surgical History:  Procedure Laterality Date  . abnormal PAP     low grade intrepithelial lesion. Colposcipy okay  . APPENDECTOMY     1982- along /w tubal ligation   . Benign tumors of left foot  1992/1994  . BREAST BIOPSY Right 2013  . COLONOSCOPY  8/02  . ESOPHAGOGASTRODUODENOSCOPY  8/02  . FOOT NEUROMA SURGERY     L foot- 1990's  . LUNG REMOVAL, PARTIAL    . TUBAL LIGATION  1982  . VD     x 2  . VIDEO BRONCHOSCOPY  11/16/2011   Procedure: VIDEO BRONCHOSCOPY WITH FLUORO;  Surgeon: Leanna Sato. Elsworth Soho, MD;  Location: [facility];  Service: Cardiopulmonary;  Laterality: Bilateral;  . VIDEO BRONCHOSCOPY  12/21/2011   Procedure: VIDEO BRONCHOSCOPY;  Surgeon: Gaye Pollack, MD;  Location: [facility] OR;  Service: Thoracic;  Laterality: N/A;   Family History    Problem Relation Age of Onset  . Coronary artery disease Mother   . Heart disease Mother     atrial fibrillation  . Lung cancer Father   . Lung cancer Brother   . Breast cancer Paternal Aunt 54  . Cirrhosis Maternal Grandmother   . Anesthesia problems Neg Hx    Social History  Substance Use Topics  . Smoking status: Former Smoker    Packs/day: 1.00    Years: 25.00    Types: Cigarettes    Quit date: 06/25/2010  . Smokeless tobacco: Never Used  . Alcohol use No      Review of Systems Per HPI    Objective:   Physical Exam Physical Exam  Vitals reviewed. Constitutional: Oriented to person, place, and time. Appears well-developed and well-nourished.  HENT:  Head: Normocephalic and atraumatic.  Eyes: Conjunctivae are normal.  Neck: Normal range of motion. Neck supple.  Cardiovascular: Normal rate.   Pulmonary/Chest: Effort normal.  Musculoskeletal: Normal range of motion. + 1 pretibial edema Neurological: Alert and oriented to person, place, and time.  Skin: Skin is warm and dry.  Psychiatric: Normal mood and affect. Behavior is normal. Judgment and thought content normal.      BP (!) 142/82 (BP Location: Left Arm, Patient Position: Sitting, Cuff Size: Normal)  Pulse 81   Temp 98.1 F (36.7 C) (Oral)   Wt 167 lb (75.8 kg)   SpO2 98%   BMI 27.79 kg/m   BP Readings from Last 3 Encounters:  08/31/16 (!) 142/82  08/29/16 (!) 149/76  08/03/16 (!) 170/100    Wt Readings from Last 3 Encounters:  08/31/16 167 lb (75.8 kg)  08/29/16 160 lb (72.6 kg)  08/03/16 165 lb (74.8 kg)       Assessment & Plan:  1. Essential hypertension - stop amlodipine, start lisinopril - she will let me know how she is doing - lisinopril (PRINIVIL,ZESTRIL) 10 MG tablet; Take 1 tablet (10 mg total) by mouth daily.  Dispense: 90 tablet; Refill: 1 - follow up with Dr. Silvio Pate next month as scheduled  2. Pedal edema - new problem which will hopefully resolve with discontinuing  amlodipine - RTC if no improvement with medication change   Clarene Reamer, FNP-BC  [facility]  08/31/2016 11:26 AM

## 2016-08-31 NOTE — Patient Instructions (Signed)
Please stop your amlodipine Start your lisinopril tomorrow, let me know how your blood pressure is doing

## 2016-09-06 ENCOUNTER — Ambulatory Visit: Payer: 59 | Admitting: Anesthesiology

## 2016-09-06 ENCOUNTER — Encounter
Admission: RE | Disposition: A | Discharge status: Home / Self Care | Payer: Self-pay | Source: Ambulatory Visit | Attending: Otolaryngology

## 2016-09-06 ENCOUNTER — Ambulatory Visit
Admission: RE | Admit: 2016-09-06 | Discharge: 2016-09-06 | Disposition: A | Discharge status: Home / Self Care | Payer: 59 | Source: Ambulatory Visit | Attending: Otolaryngology | Admitting: Otolaryngology

## 2016-09-06 ENCOUNTER — Encounter: Payer: Self-pay | Admitting: *Deleted

## 2016-09-06 DIAGNOSIS — Z85118 Personal history of other malignant neoplasm of bronchus and lung: Secondary | ICD-10-CM | POA: Diagnosis not present

## 2016-09-06 DIAGNOSIS — Z902 Acquired absence of lung [part of]: Secondary | ICD-10-CM | POA: Insufficient documentation

## 2016-09-06 DIAGNOSIS — Z87891 Personal history of nicotine dependence: Secondary | ICD-10-CM | POA: Insufficient documentation

## 2016-09-06 DIAGNOSIS — R0982 Postnasal drip: Secondary | ICD-10-CM | POA: Insufficient documentation

## 2016-09-06 DIAGNOSIS — I10 Essential (primary) hypertension: Secondary | ICD-10-CM | POA: Diagnosis not present

## 2016-09-06 DIAGNOSIS — Z538 Procedure and treatment not carried out for other reasons: Secondary | ICD-10-CM | POA: Diagnosis present

## 2016-09-06 DIAGNOSIS — R05 Cough: Secondary | ICD-10-CM | POA: Insufficient documentation

## 2016-09-06 SURGERY — THYROIDECTOMY
Anesthesia: General

## 2016-09-06 MED ORDER — ACETAMINOPHEN 10 MG/ML IV SOLN
INTRAVENOUS | Status: AC
  Filled 2016-09-06: qty 100

## 2016-09-06 MED ORDER — BACITRACIN ZINC 500 UNIT/GM EX OINT
TOPICAL_OINTMENT | CUTANEOUS | Status: AC
  Filled 2016-09-06: qty 28.35

## 2016-09-06 MED ORDER — LACTATED RINGERS IV SOLN
INTRAVENOUS | Status: DC
  Administered 2016-09-06: 07:00:00 via INTRAVENOUS

## 2016-09-06 MED ORDER — LIDOCAINE-EPINEPHRINE 1 %-1:100000 IJ SOLN
INTRAMUSCULAR | Status: AC
  Filled 2016-09-06: qty 1

## 2016-09-06 SURGICAL SUPPLY — 34 items
BLADE SURG 15 STRL LF DISP TIS (BLADE) IMPLANT
BLADE SURG 15 STRL SS (BLADE)
CANISTER SUCT 1200ML W/VALVE (MISCELLANEOUS) IMPLANT
CORD BIP STRL DISP 12FT (MISCELLANEOUS) IMPLANT
DRAIN TLS ROUND 10FR (DRAIN) IMPLANT
DRAPE MAG INST 16X20 L/F (DRAPES) IMPLANT
DRSG TEGADERM 2-3/8X2-3/4 SM (GAUZE/BANDAGES/DRESSINGS) IMPLANT
ELECT LARYNGEAL 6/7 (MISCELLANEOUS)
ELECT LARYNGEAL 8/9 (MISCELLANEOUS)
ELECT REM PT RETURN 9FT ADLT (ELECTROSURGICAL)
ELECTRODE LARYNGEAL 6/7 (MISCELLANEOUS) IMPLANT
ELECTRODE LARYNGEAL 8/9 (MISCELLANEOUS) IMPLANT
ELECTRODE REM PT RTRN 9FT ADLT (ELECTROSURGICAL) IMPLANT
FORCEPS JEWEL BIP 4-3/4 STR (INSTRUMENTS) IMPLANT
GLOVE BIO SURGEON STRL SZ7.5 (GLOVE) IMPLANT
GOWN STRL REUS W/ TWL LRG LVL3 (GOWN DISPOSABLE) IMPLANT
GOWN STRL REUS W/TWL LRG LVL3 (GOWN DISPOSABLE)
HARMONIC SCALPEL FOCUS (MISCELLANEOUS) IMPLANT
HEMOSTAT SURGICEL 2X3 (HEMOSTASIS) IMPLANT
HOOK STAY BLUNT/RETRACTOR 5M (MISCELLANEOUS) IMPLANT
KIT RM TURNOVER STRD PROC AR (KITS) IMPLANT
LABEL OR SOLS (LABEL) IMPLANT
NS IRRIG 500ML POUR BTL (IV SOLUTION) IMPLANT
PACK HEAD/NECK (MISCELLANEOUS) IMPLANT
PROBE NEUROSIGN BIPOL (MISCELLANEOUS) IMPLANT
PROBE NEUROSIGN BIPOLAR (MISCELLANEOUS)
SPONGE KITTNER 5P (MISCELLANEOUS) IMPLANT
SPONGE XRAY 4X4 16PLY STRL (MISCELLANEOUS) IMPLANT
SUT PROLENE 3 0 PS 2 (SUTURE) IMPLANT
SUT SILK 2 0 (SUTURE)
SUT SILK 2 0 SH (SUTURE) IMPLANT
SUT SILK 2-0 18XBRD TIE 12 (SUTURE) IMPLANT
SUT VIC AB 4-0 RB1 18 (SUTURE) IMPLANT
SYSTEM CHEST DRAIN TLS 7FR (DRAIN) IMPLANT

## 2016-09-06 NOTE — Anesthesia Preprocedure Evaluation (Signed)
Anesthesia Evaluation  Patient identified by MRN, date of birth, ID band Patient awake    Reviewed: Allergy & Precautions, H&P , NPO status , Patient's Chart, lab work & pertinent test results, reviewed documented beta blocker date and time   Airway Mallampati: II  TM Distance: >3 FB Neck ROM: full    Dental  (+) Teeth Intact, Upper Dentures, Lower Dentures   Pulmonary neg pulmonary ROS, Recent URI , former smoker,    Pulmonary exam normal        Cardiovascular hypertension, negative cardio ROS Normal cardiovascular exam Rhythm:regular Rate:Normal     Neuro/Psych  Neuromuscular disease negative neurological ROS  negative psych ROS   GI/Hepatic negative GI ROS, Neg liver ROS, hiatal hernia, GERD  ,  Endo/Other  negative endocrine ROS  Renal/GU negative Renal ROS  negative genitourinary   Musculoskeletal   Abdominal   Peds  Hematology negative hematology ROS (+)   Anesthesia Other Findings Past Medical History: No date: Arthritis     Comment: arm 10/2011: Cancer ([place])     Comment: Left Lung cancer with Partial Lobectomy. No date: Coughing up blood     Comment: 10/2011, admitted to [facility]  No date: GERD (gastroesophageal reflux disease) No date: Hiatal hernia No date: Hyperlipidemia No date: Hypertension     Comment: treated for while in hosp. 10/2011, given med.               , unsure of name of med., not told to continue  No date: Osteopenia No date: Psoriasis     Comment: has used methotrexate since she was 65y.o.,               when psoriasis flares. Currently methotrexate               on hold since 11/05/2011  No date: Recurrent upper respiratory infection (URI)     Comment: lung infection, treated /w antibiotic - 10/2011 Past Surgical History: No date: abnormal PAP     Comment: low grade intrepithelial lesion. Colposcipy               okay No date: APPENDECTOMY     Comment: 1982- along /w tubal  ligation  1992/1994: Benign tumors of left foot 2013: BREAST BIOPSY Right 8/02: COLONOSCOPY 8/02: ESOPHAGOGASTRODUODENOSCOPY No date: FOOT NEUROMA SURGERY     Comment: L foot- 1990's No date: LUNG REMOVAL, PARTIAL 1982: TUBAL LIGATION No date: VD     Comment: x 2 11/16/2011: VIDEO BRONCHOSCOPY     Comment: Procedure: VIDEO BRONCHOSCOPY WITH FLUORO;                Surgeon: Leanna Sato. Elsworth Soho, MD;  Location: [facility]               ENDOSCOPY;  Service: Cardiopulmonary;                Laterality: Bilateral; 12/21/2011: VIDEO BRONCHOSCOPY     Comment: Procedure: VIDEO BRONCHOSCOPY;  Surgeon: Gaye Pollack, MD;  Location: [facility] OR;  Service:               Thoracic;  Laterality: N/A; BMI    Body Mass Index:  26.63 kg/m     Reproductive/Obstetrics negative OB ROS                             Anesthesia Physical Anesthesia Plan  ASA: II  Anesthesia Plan: General ETT   Post-op Pain Management:    Induction:   Airway Management Planned:   Additional Equipment:   Intra-op Plan:   Post-operative Plan:   Informed Consent: I have reviewed the patients History and Physical, chart, labs and discussed the procedure including the risks, benefits and alternatives for the proposed anesthesia with the patient or authorized representative who has indicated his/her understanding and acceptance.   Dental Advisory Given  Plan Discussed with: CRNA  Anesthesia Plan Comments: (Pt with nonproductive cough with post nasal drip and temp tympanic 101 with oral 99.3.  JA)        Anesthesia Quick Evaluation

## 2016-09-07 ENCOUNTER — Encounter: Payer: Self-pay | Admitting: Internal Medicine

## 2016-09-07 IMAGING — US US SOFT TISSUE HEAD/NECK
1 series · 14 of 25 positions shown · non-contrast
Comparison: 04/06/2015

CLINICAL DATA: History of dominant 2.2 cm right thyroid nodule with
prior biopsy on 05/03/2015 demonstrating evidence of atypia and [REDACTED] III lesion.

EXAM:
THYROID ULTRASOUND
TECHNIQUE: Ultrasound examination of the thyroid gland and adjacent soft
tissues was performed.

[Series 1: us soft tissue head/neck · 0.07mm/px · 14 of 62 slices shown]
[im 1/62]
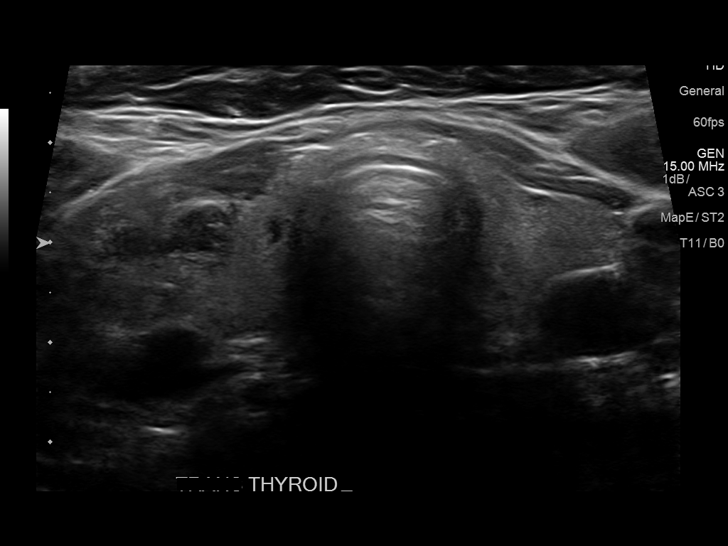
[im 6/62]
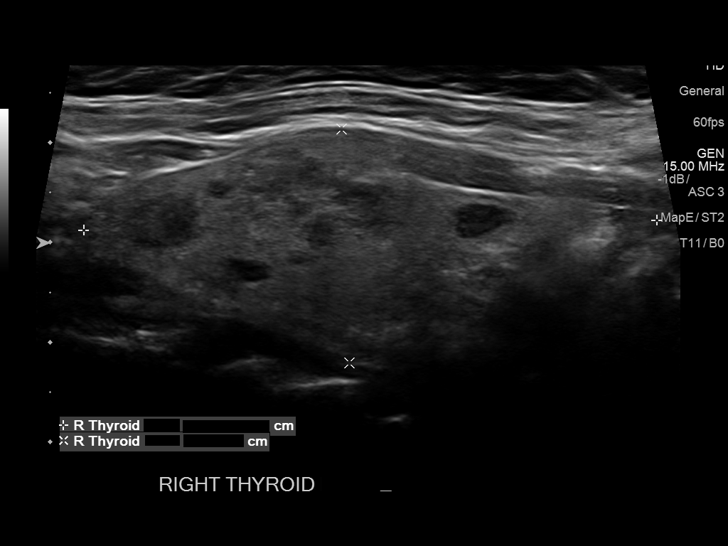
[im 11/62]
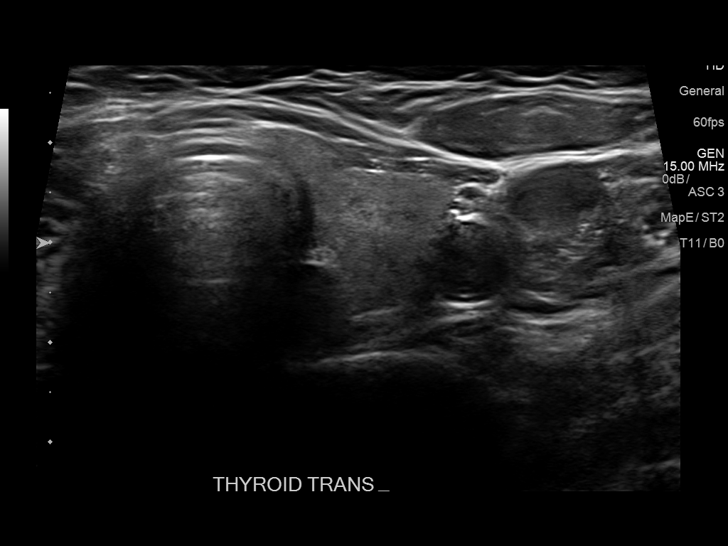
[im 16/62]
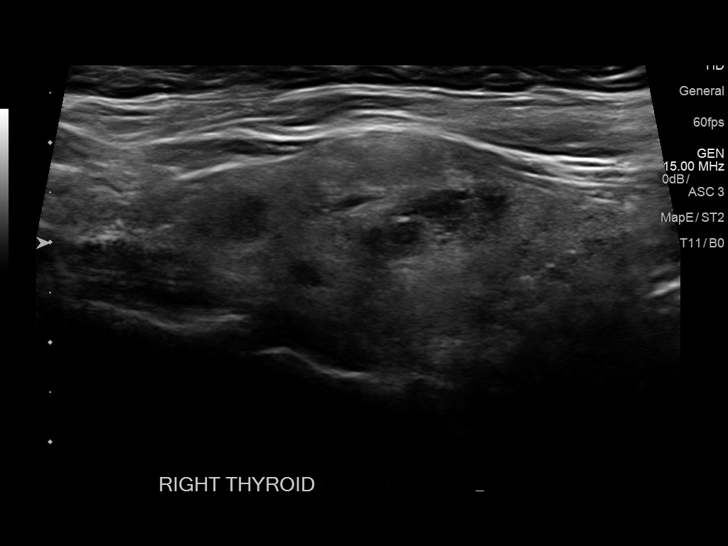
[im 21/62]
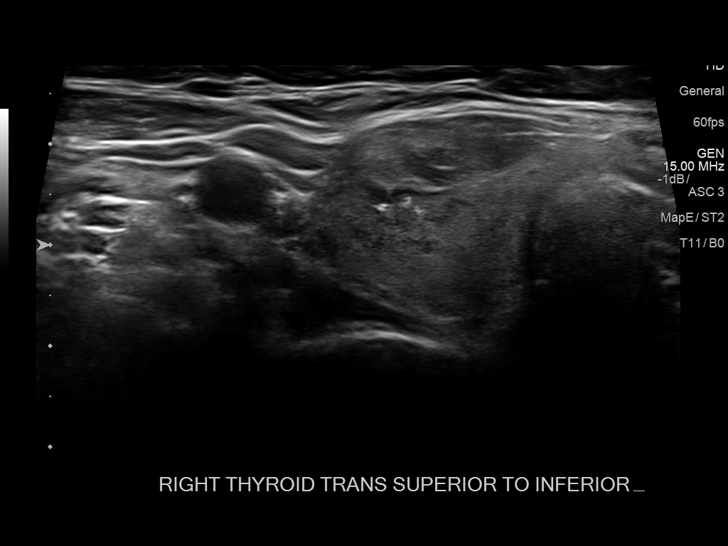
[im 23/62]
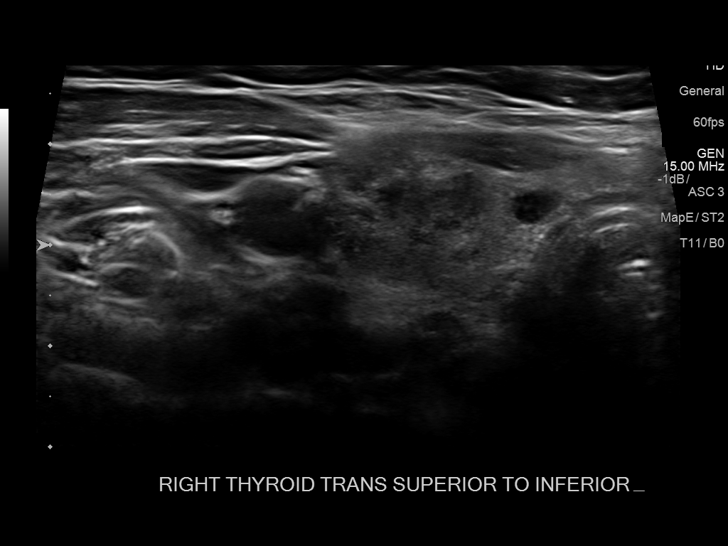
[im 28/62]
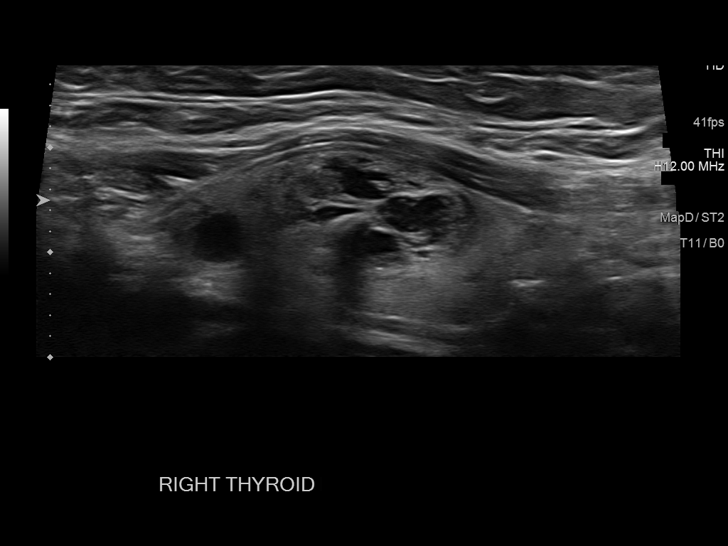
[im 34/62]
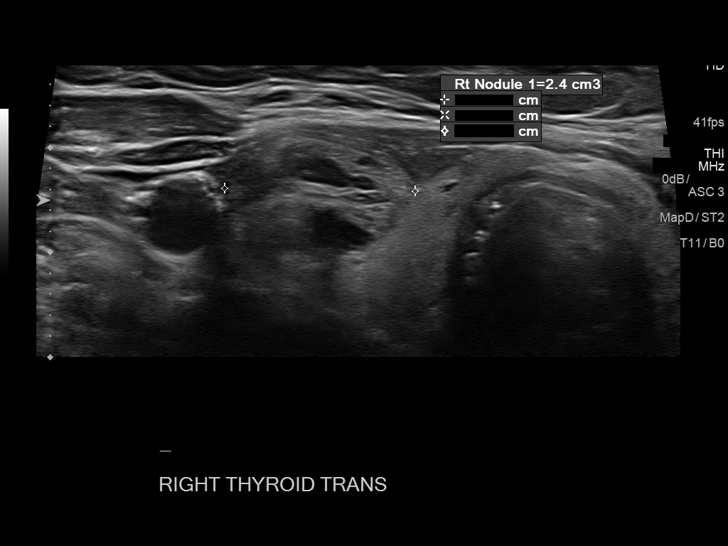
[im 39/62]
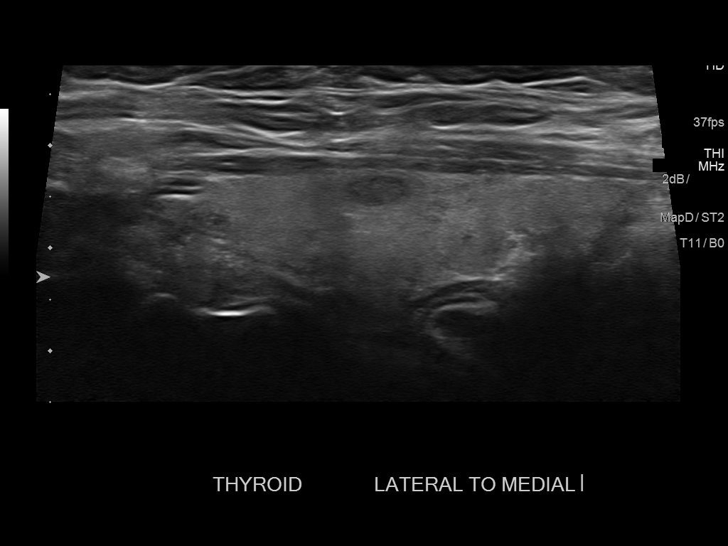
[im 41/62]
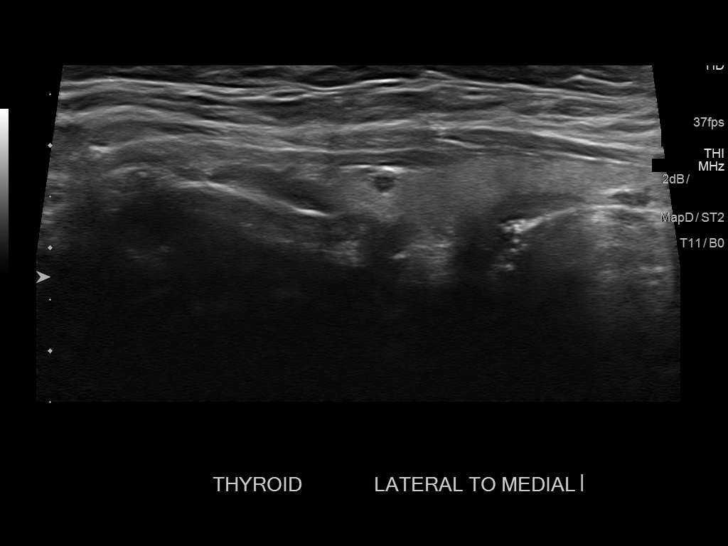
[im 46/62]
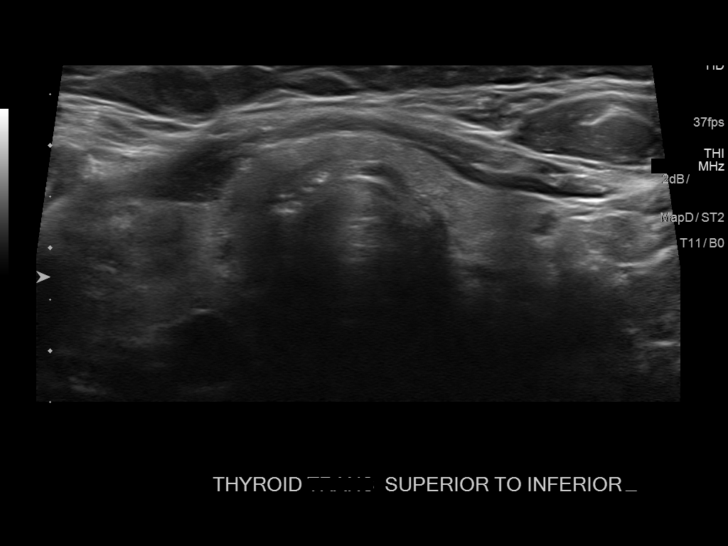
[im 51/62]
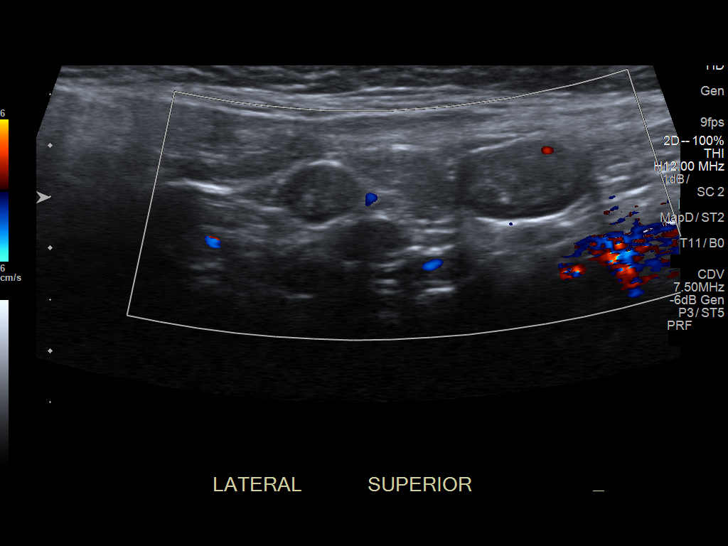
[im 56/62]
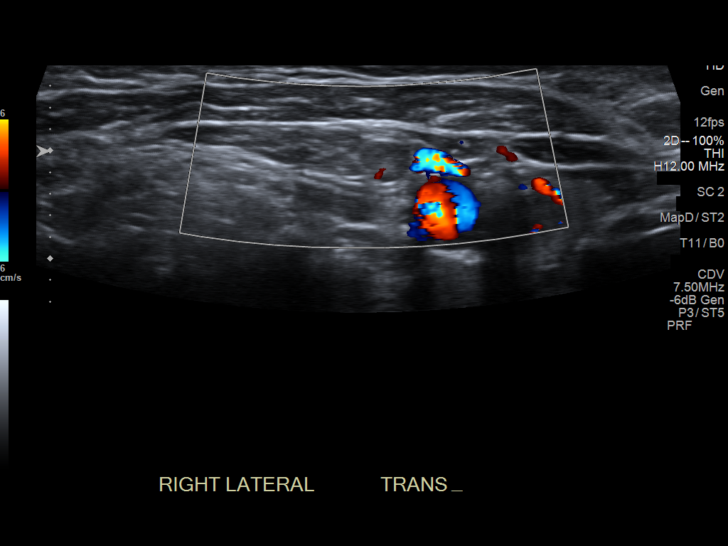
[im 62/62]
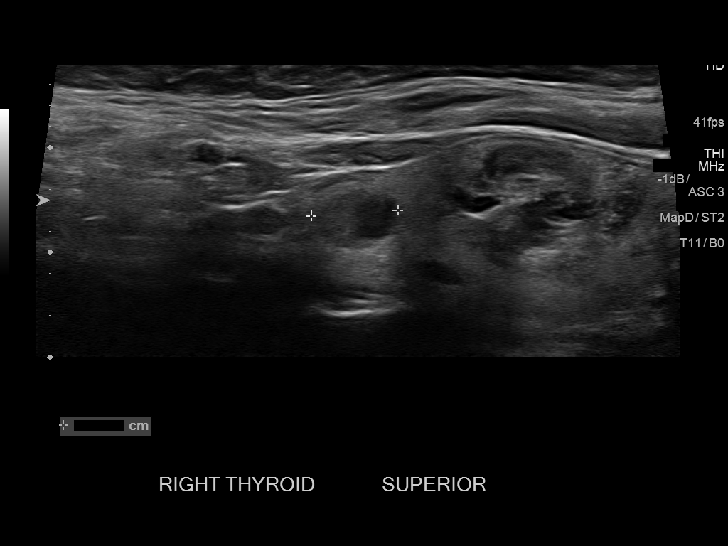

[14 of 25 positions shown; findings below may reference images not displayed]

FINDINGS: Right thyroid lobe

Measurements: 5.7 x 2.3 x 2.1 cm. The dominant mid right thyroid
nodule shows stable size and some increased internal cystic
degeneration since the prior study. Nodule dimensions are
approximately 2.1 x 1.2 x 1.8 cm (previously 2.2 x 1.3 x 1.8 cm).
Other small subcentimeter cystic areas of nodularity are stable.

Left thyroid lobe

Measurements: 4.9 x 1.6 x 1.4 cm. Stable subcentimeter cystic
nodules.

Isthmus

Thickness: 0.3 cm.  No nodules visualized.

Lymphadenopathy

None visualized.
IMPRESSION: Stable size of dominant right thyroid nodule. This nodule
demonstrates increased internal cystic degeneration since the prior
study.

## 2016-09-20 ENCOUNTER — Ambulatory Visit: Payer: 59 | Admitting: Certified Registered Nurse Anesthetist

## 2016-09-20 ENCOUNTER — Encounter: Payer: Self-pay | Admitting: *Deleted

## 2016-09-20 ENCOUNTER — Encounter
Admission: RE | Disposition: A | Discharge status: Home / Self Care | Payer: Self-pay | Source: Ambulatory Visit | Attending: Otolaryngology

## 2016-09-20 ENCOUNTER — Observation Stay
Admission: RE | Admit: 2016-09-20 | Discharge: 2016-09-21 | Disposition: A | Discharge status: Home / Self Care | Payer: 59 | Source: Ambulatory Visit | Attending: Otolaryngology | Admitting: Otolaryngology

## 2016-09-20 DIAGNOSIS — Z85118 Personal history of other malignant neoplasm of bronchus and lung: Secondary | ICD-10-CM | POA: Insufficient documentation

## 2016-09-20 DIAGNOSIS — E041 Nontoxic single thyroid nodule: Secondary | ICD-10-CM | POA: Diagnosis present

## 2016-09-20 DIAGNOSIS — K219 Gastro-esophageal reflux disease without esophagitis: Secondary | ICD-10-CM | POA: Insufficient documentation

## 2016-09-20 DIAGNOSIS — E049 Nontoxic goiter, unspecified: Secondary | ICD-10-CM | POA: Diagnosis not present

## 2016-09-20 DIAGNOSIS — Z87891 Personal history of nicotine dependence: Secondary | ICD-10-CM | POA: Diagnosis not present

## 2016-09-20 HISTORY — PX: THYROIDECTOMY: SHX17

## 2016-09-20 LAB — CALCIUM: CALCIUM: 8.7 mg/dL — AB (ref 8.9–10.3)

## 2016-09-20 SURGERY — THYROIDECTOMY
Anesthesia: General | Wound class: Clean

## 2016-09-20 MED ORDER — DOCUSATE SODIUM 100 MG PO CAPS
100.0000 mg | ORAL_CAPSULE | Freq: Two times a day (BID) | ORAL | Status: DC
  Administered 2016-09-20 – 2016-09-21 (×3): 100 mg via ORAL
  Filled 2016-09-20 (×3): qty 1

## 2016-09-20 MED ORDER — ONDANSETRON HCL 4 MG/2ML IJ SOLN: 4.0000 mg | Freq: Once | INTRAMUSCULAR | Status: DC | PRN

## 2016-09-20 MED ORDER — PANTOPRAZOLE SODIUM 20 MG PO TBEC
20.0000 mg | DELAYED_RELEASE_TABLET | Freq: Every day | ORAL | Status: DC
  Filled 2016-09-20: qty 1

## 2016-09-20 MED ORDER — SEVOFLURANE IN SOLN
RESPIRATORY_TRACT | Status: AC
  Filled 2016-09-20: qty 250

## 2016-09-20 MED ORDER — BUPIVACAINE-EPINEPHRINE (PF) 0.25% -1:200000 IJ SOLN
INTRAMUSCULAR | Status: AC
  Filled 2016-09-20: qty 30

## 2016-09-20 MED ORDER — ONDANSETRON HCL 4 MG/2ML IJ SOLN
INTRAMUSCULAR | Status: DC | PRN
  Administered 2016-09-20: 4 mg via INTRAVENOUS

## 2016-09-20 MED ORDER — ALBUTEROL SULFATE HFA 108 (90 BASE) MCG/ACT IN AERS
INHALATION_SPRAY | RESPIRATORY_TRACT | Status: AC
  Filled 2016-09-20: qty 6.7

## 2016-09-20 MED ORDER — ONDANSETRON HCL 4 MG PO TABS
4.0000 mg | ORAL_TABLET | ORAL | Status: DC | PRN
  Administered 2016-09-20: 4 mg via ORAL

## 2016-09-20 MED ORDER — BACITRACIN ZINC 500 UNIT/GM EX OINT
1.0000 "application " | TOPICAL_OINTMENT | Freq: Three times a day (TID) | CUTANEOUS | Status: DC
  Administered 2016-09-20 – 2016-09-21 (×3): 1 via TOPICAL
  Filled 2016-09-20 (×2): qty 0.9

## 2016-09-20 MED ORDER — ALBUTEROL SULFATE HFA 108 (90 BASE) MCG/ACT IN AERS
INHALATION_SPRAY | RESPIRATORY_TRACT | Status: DC | PRN
  Administered 2016-09-20: 6 via RESPIRATORY_TRACT

## 2016-09-20 MED ORDER — SUGAMMADEX SODIUM 200 MG/2ML IV SOLN
INTRAVENOUS | Status: AC
  Filled 2016-09-20: qty 2

## 2016-09-20 MED ORDER — PHENYLEPHRINE 40 MCG/ML (10ML) SYRINGE FOR IV PUSH (FOR BLOOD PRESSURE SUPPORT)
PREFILLED_SYRINGE | INTRAVENOUS | Status: AC
  Filled 2016-09-20: qty 10

## 2016-09-20 MED ORDER — FENTANYL CITRATE (PF) 100 MCG/2ML IJ SOLN
INTRAMUSCULAR | Status: AC
  Filled 2016-09-20: qty 2

## 2016-09-20 MED ORDER — SODIUM CHLORIDE 0.9 % IV SOLN
INTRAVENOUS | Status: DC | PRN
  Administered 2016-09-20: 125 ug/min via INTRAVENOUS

## 2016-09-20 MED ORDER — PHENYLEPHRINE HCL 10 MG/ML IJ SOLN
INTRAMUSCULAR | Status: DC | PRN
  Administered 2016-09-20 (×3): 80 ug via INTRAVENOUS

## 2016-09-20 MED ORDER — FENTANYL CITRATE (PF) 100 MCG/2ML IJ SOLN
INTRAMUSCULAR | Status: DC | PRN
  Administered 2016-09-20: 100 ug via INTRAVENOUS
  Administered 2016-09-20: 50 ug via INTRAVENOUS

## 2016-09-20 MED ORDER — EPINEPHRINE PF 1 MG/ML IJ SOLN
INTRAMUSCULAR | Status: AC
  Filled 2016-09-20: qty 1

## 2016-09-20 MED ORDER — MIDAZOLAM HCL 2 MG/2ML IJ SOLN
INTRAMUSCULAR | Status: DC | PRN
  Administered 2016-09-20: 2 mg via INTRAVENOUS

## 2016-09-20 MED ORDER — LIDOCAINE-EPINEPHRINE 2 %-1:100000 IJ SOLN
INTRAMUSCULAR | Status: DC | PRN
  Administered 2016-09-20: 3 mL via INTRADERMAL

## 2016-09-20 MED ORDER — KCL IN DEXTROSE-NACL 20-5-0.45 MEQ/L-%-% IV SOLN
INTRAVENOUS | Status: DC
  Administered 2016-09-20: 16:00:00 via INTRAVENOUS
  Filled 2016-09-20 (×4): qty 1000

## 2016-09-20 MED ORDER — LACTATED RINGERS IV SOLN
INTRAVENOUS | Status: DC | PRN
  Administered 2016-09-20 (×2): via INTRAVENOUS

## 2016-09-20 MED ORDER — LIDOCAINE HCL (PF) 1 % IJ SOLN
INTRAMUSCULAR | Status: AC
  Filled 2016-09-20: qty 30

## 2016-09-20 MED ORDER — MIDAZOLAM HCL 2 MG/2ML IJ SOLN
INTRAMUSCULAR | Status: AC
  Filled 2016-09-20: qty 2

## 2016-09-20 MED ORDER — ONDANSETRON HCL 4 MG/2ML IJ SOLN
4.0000 mg | INTRAMUSCULAR | Status: DC | PRN
  Filled 2016-09-20: qty 2

## 2016-09-20 MED ORDER — BACITRACIN ZINC 500 UNIT/GM EX OINT
TOPICAL_OINTMENT | CUTANEOUS | Status: AC
  Filled 2016-09-20: qty 28.35

## 2016-09-20 MED ORDER — PROPOFOL 10 MG/ML IV BOLUS
INTRAVENOUS | Status: AC
  Filled 2016-09-20: qty 20

## 2016-09-20 MED ORDER — LIDOCAINE HCL (CARDIAC) 20 MG/ML IV SOLN
INTRAVENOUS | Status: DC | PRN
Start: 2016-09-20 — End: 2016-09-20
  Administered 2016-09-20: 30 mg via INTRAVENOUS
  Administered 2016-09-20: 50 mg via INTRAVENOUS

## 2016-09-20 MED ORDER — MORPHINE SULFATE (PF) 4 MG/ML IV SOLN
2.0000 mg | INTRAVENOUS | Status: DC | PRN
  Administered 2016-09-20 (×2): 4 mg via INTRAVENOUS
  Filled 2016-09-20 (×2): qty 1

## 2016-09-20 MED ORDER — SUCCINYLCHOLINE CHLORIDE 200 MG/10ML IV SOSY
PREFILLED_SYRINGE | INTRAVENOUS | Status: AC
  Filled 2016-09-20: qty 10

## 2016-09-20 MED ORDER — SODIUM CHLORIDE 0.9 % IV SOLN
INTRAVENOUS | Status: DC | PRN
  Administered 2016-09-20: .08 ug/kg/min via INTRAVENOUS

## 2016-09-20 MED ORDER — LISINOPRIL 10 MG PO TABS
10.0000 mg | ORAL_TABLET | Freq: Every day | ORAL | Status: DC
  Administered 2016-09-21: 10 mg via ORAL
  Filled 2016-09-20: qty 1

## 2016-09-20 MED ORDER — SUCCINYLCHOLINE CHLORIDE 20 MG/ML IJ SOLN
INTRAMUSCULAR | Status: DC | PRN
  Administered 2016-09-20: 100 mg via INTRAVENOUS

## 2016-09-20 MED ORDER — REMIFENTANIL HCL 1 MG IV SOLR
INTRAVENOUS | Status: AC
Start: 2016-09-20 — End: 2016-09-20
  Filled 2016-09-20: qty 1000

## 2016-09-20 MED ORDER — PHENYLEPHRINE HCL 10 MG/ML IJ SOLN
INTRAMUSCULAR | Status: AC
  Filled 2016-09-20: qty 1

## 2016-09-20 MED ORDER — LACTATED RINGERS IV SOLN
INTRAVENOUS | Status: DC
  Administered 2016-09-20: 07:00:00 via INTRAVENOUS

## 2016-09-20 MED ORDER — LIDOCAINE 2% (20 MG/ML) 5 ML SYRINGE
INTRAMUSCULAR | Status: AC
  Filled 2016-09-20: qty 5

## 2016-09-20 MED ORDER — HYDROCODONE-ACETAMINOPHEN 5-325 MG PO TABS
1.0000 | ORAL_TABLET | ORAL | Status: DC | PRN
  Administered 2016-09-20: 1 via ORAL
  Filled 2016-09-20: qty 1

## 2016-09-20 MED ORDER — EPHEDRINE SULFATE 50 MG/ML IJ SOLN
INTRAMUSCULAR | Status: DC | PRN
  Administered 2016-09-20: 10 mg via INTRAVENOUS

## 2016-09-20 MED ORDER — CALCIUM CARBONATE-VITAMIN D 500-200 MG-UNIT PO TABS
2.0000 | ORAL_TABLET | Freq: Two times a day (BID) | ORAL | Status: DC
  Administered 2016-09-20 – 2016-09-21 (×3): 2 via ORAL
  Filled 2016-09-20 (×3): qty 2

## 2016-09-20 MED ORDER — REMIFENTANIL HCL 1 MG IV SOLR
INTRAVENOUS | Status: AC
  Filled 2016-09-20: qty 1000

## 2016-09-20 MED ORDER — DEXAMETHASONE SODIUM PHOSPHATE 10 MG/ML IJ SOLN
INTRAMUSCULAR | Status: AC
  Filled 2016-09-20: qty 1

## 2016-09-20 MED ORDER — DEXAMETHASONE SODIUM PHOSPHATE 10 MG/ML IJ SOLN
INTRAMUSCULAR | Status: DC | PRN
  Administered 2016-09-20: 10 mg via INTRAVENOUS

## 2016-09-20 MED ORDER — EPHEDRINE 5 MG/ML INJ
INTRAVENOUS | Status: AC
  Filled 2016-09-20: qty 10

## 2016-09-20 MED ORDER — LIDOCAINE-EPINEPHRINE 2 %-1:100000 IJ SOLN
INTRAMUSCULAR | Status: AC
Start: 2016-09-20 — End: 2016-09-20
  Filled 2016-09-20: qty 50

## 2016-09-20 MED ORDER — ONDANSETRON HCL 4 MG/2ML IJ SOLN
INTRAMUSCULAR | Status: AC
  Filled 2016-09-20: qty 2

## 2016-09-20 MED ORDER — PROPOFOL 10 MG/ML IV BOLUS
INTRAVENOUS | Status: DC | PRN
  Administered 2016-09-20: 50 mg via INTRAVENOUS
  Administered 2016-09-20: 150 mg via INTRAVENOUS

## 2016-09-20 MED ORDER — FENTANYL CITRATE (PF) 100 MCG/2ML IJ SOLN
25.0000 ug | INTRAMUSCULAR | Status: DC | PRN
  Administered 2016-09-20 (×3): 50 ug via INTRAVENOUS

## 2016-09-20 SURGICAL SUPPLY — 34 items
BLADE SURG 15 STRL LF DISP TIS (BLADE) ×1 IMPLANT
BLADE SURG 15 STRL SS (BLADE) ×2
CANISTER SUCT 1200ML W/VALVE (MISCELLANEOUS) ×3 IMPLANT
CORD BIP STRL DISP 12FT (MISCELLANEOUS) ×3 IMPLANT
DRAIN TLS ROUND 10FR (DRAIN) IMPLANT
DRAPE MAG INST 16X20 L/F (DRAPES) ×3 IMPLANT
DRSG TEGADERM 2-3/8X2-3/4 SM (GAUZE/BANDAGES/DRESSINGS) ×3 IMPLANT
ELECT LARYNGEAL 6/7 (MISCELLANEOUS) ×3
ELECT LARYNGEAL 8/9 (MISCELLANEOUS)
ELECT REM PT RETURN 9FT ADLT (ELECTROSURGICAL) ×3
ELECTRODE LARYNGEAL 6/7 (MISCELLANEOUS) ×1 IMPLANT
ELECTRODE LARYNGEAL 8/9 (MISCELLANEOUS) IMPLANT
ELECTRODE REM PT RTRN 9FT ADLT (ELECTROSURGICAL) ×1 IMPLANT
FORCEPS JEWEL BIP 4-3/4 STR (INSTRUMENTS) ×3 IMPLANT
GLOVE BIO SURGEON STRL SZ7.5 (GLOVE) ×9 IMPLANT
GOWN STRL REUS W/ TWL LRG LVL3 (GOWN DISPOSABLE) ×3 IMPLANT
GOWN STRL REUS W/TWL LRG LVL3 (GOWN DISPOSABLE) ×6
HARMONIC SCALPEL FOCUS (MISCELLANEOUS) ×3 IMPLANT
HEMOSTAT SURGICEL 2X3 (HEMOSTASIS) ×3 IMPLANT
HOOK STAY BLUNT/RETRACTOR 5M (MISCELLANEOUS) ×3 IMPLANT
KIT RM TURNOVER STRD PROC AR (KITS) ×3 IMPLANT
LABEL OR SOLS (LABEL) ×3 IMPLANT
NS IRRIG 500ML POUR BTL (IV SOLUTION) ×3 IMPLANT
PACK HEAD/NECK (MISCELLANEOUS) ×3 IMPLANT
PROBE NEUROSIGN BIPOL (MISCELLANEOUS) ×1 IMPLANT
PROBE NEUROSIGN BIPOLAR (MISCELLANEOUS) ×2
SPONGE KITTNER 5P (MISCELLANEOUS) ×15 IMPLANT
SPONGE XRAY 4X4 16PLY STRL (MISCELLANEOUS) ×3 IMPLANT
SUT PROLENE 3 0 PS 2 (SUTURE) IMPLANT
SUT SILK 2 0 (SUTURE) ×2
SUT SILK 2 0 SH (SUTURE) ×3 IMPLANT
SUT SILK 2-0 18XBRD TIE 12 (SUTURE) ×1 IMPLANT
SUT VIC AB 4-0 RB1 18 (SUTURE) ×3 IMPLANT
SYSTEM CHEST DRAIN TLS 7FR (DRAIN) IMPLANT

## 2016-09-20 NOTE — Anesthesia Postprocedure Evaluation (Signed)
Anesthesia Post Note  Patient: Regina Grimes  Procedure(s) Performed: Procedure(s) (LRB): THYROIDECTOMY (N/A)  Patient location during evaluation: PACU Anesthesia Type: General Level of consciousness: awake and alert Pain management: pain level controlled Vital Signs Assessment: post-procedure vital signs reviewed and stable Respiratory status: spontaneous breathing, nonlabored ventilation, respiratory function stable and patient connected to nasal cannula oxygen Cardiovascular status: blood pressure returned to baseline and stable Postop Assessment: no signs of nausea or vomiting Anesthetic complications: no     Last Vitals:  Vitals:   09/20/16 1107 09/20/16 1149  BP: (!) 195/95 (!) 173/74  Pulse: 95 90  Resp: 17 17  Temp: 36.6 C     Last Pain:  Vitals:   09/20/16 1132  TempSrc:   PainSc: 6                  Martha Clan

## 2016-09-20 NOTE — Anesthesia Preprocedure Evaluation (Signed)
Anesthesia Evaluation  Patient identified by MRN, date of birth, ID band Patient awake    Reviewed: Allergy & Precautions, H&P , NPO status , Patient's Chart, lab work & pertinent test results, reviewed documented beta blocker date and time   Airway Mallampati: III  TM Distance: >3 FB Neck ROM: full    Dental  (+) Upper Dentures, Lower Dentures, Edentulous Upper, Edentulous Lower   Pulmonary neg shortness of breath, neg COPD, Recent URI , former smoker,  H/o lung cancer s/p left lobectomy   Pulmonary exam normal        Cardiovascular hypertension, (-) angina(-) CAD, (-) Past MI, (-) Cardiac Stents and (-) CABG Normal cardiovascular exam(-) dysrhythmias (-) Valvular Problems/Murmurs Rhythm:regular Rate:Normal     Neuro/Psych neg Seizures  Neuromuscular disease negative psych ROS   GI/Hepatic Neg liver ROS, hiatal hernia, GERD  ,  Endo/Other  negative endocrine ROS  Renal/GU negative Renal ROS  negative genitourinary   Musculoskeletal   Abdominal   Peds  Hematology negative hematology ROS (+)   Anesthesia Other Findings Past Medical History: No date: Arthritis     Comment: arm 10/2011: Cancer ([place])     Comment: Left Lung cancer with Partial Lobectomy. No date: Coughing up blood     Comment: 10/2011, admitted to [facility]  No date: GERD (gastroesophageal reflux disease) No date: Hiatal hernia No date: Hyperlipidemia No date: Hypertension     Comment: treated for while in hosp. 10/2011, given med.               , unsure of name of med., not told to continue  No date: Osteopenia No date: Psoriasis     Comment: has used methotrexate since she was 65y.o.,               when psoriasis flares. Currently methotrexate               on hold since 11/05/2011  No date: Recurrent upper respiratory infection (URI)     Comment: lung infection, treated /w antibiotic - 10/2011 Past Surgical History: No date: abnormal PAP  Comment: low grade intrepithelial lesion. Colposcipy               okay No date: APPENDECTOMY     Comment: 1982- along /w tubal ligation  1992/1994: Benign tumors of left foot 2013: BREAST BIOPSY Right 8/02: COLONOSCOPY 8/02: ESOPHAGOGASTRODUODENOSCOPY No date: FOOT NEUROMA SURGERY     Comment: L foot- 1990's No date: LUNG REMOVAL, PARTIAL 1982: TUBAL LIGATION No date: VD     Comment: x 2 11/16/2011: VIDEO BRONCHOSCOPY     Comment: Procedure: VIDEO BRONCHOSCOPY WITH FLUORO;                Surgeon: Leanna Sato. Elsworth Soho, MD;  Location: [facility]               ENDOSCOPY;  Service: Cardiopulmonary;                Laterality: Bilateral; 12/21/2011: VIDEO BRONCHOSCOPY     Comment: Procedure: VIDEO BRONCHOSCOPY;  Surgeon: Gaye Pollack, MD;  Location: [facility] OR;  Service:               Thoracic;  Laterality: N/A; BMI    Body Mass Index:  26.63 kg/m     Reproductive/Obstetrics negative OB ROS  Anesthesia Physical  Anesthesia Plan  ASA: II  Anesthesia Plan: General ETT   Post-op Pain Management:    Induction:   Airway Management Planned:   Additional Equipment:   Intra-op Plan:   Post-operative Plan:   Informed Consent: I have reviewed the patients History and Physical, chart, labs and discussed the procedure including the risks, benefits and alternatives for the proposed anesthesia with the patient or authorized representative who has indicated his/her understanding and acceptance.   Dental Advisory Given  Plan Discussed with: CRNA  Anesthesia Plan Comments: (Pt with nonproductive cough with post nasal drip and temp tympanic 101 with oral 99.3.  JA)        Anesthesia Quick Evaluation

## 2016-09-20 NOTE — Transfer of Care (Signed)
Immediate Anesthesia Transfer of Care Note  Patient: Regina Grimes  Procedure(s) Performed: Procedure(s): THYROIDECTOMY (N/A)  Patient Location: PACU  Anesthesia Type:General  Level of Consciousness: awake and patient cooperative  Airway & Oxygen Therapy: Patient Spontanous Breathing and Patient connected to nasal cannula oxygen  Post-op Assessment: Report given to RN and Post -op Vital signs reviewed and stable  Post vital signs: Reviewed and stable  Last Vitals:  Vitals:   09/20/16 0612 09/20/16 0950  BP: (!) 177/81 (!) 194/88  Pulse: 88 (!) 105  Resp: 18 16  Temp: 36.7 C 36.2 C    Last Pain:  Vitals:   09/20/16 0950  TempSrc: Temporal         Complications: No apparent anesthesia complications

## 2016-09-20 NOTE — Anesthesia Procedure Notes (Signed)
Procedure Name: Intubation Date/Time: 09/20/2016 7:45 AM Performed by: Darlyne Russian Pre-anesthesia Checklist: Patient identified, Emergency Drugs available, Suction available, Patient being monitored and Timeout performed Patient Re-evaluated:Patient Re-evaluated prior to inductionOxygen Delivery Method: Circle system utilized Preoxygenation: Pre-oxygenation with 100% oxygen Intubation Type: IV induction Ventilation: Mask ventilation with difficulty Laryngoscope Size: Glidescope and 3 Grade View: Grade II Tube type: Oral Tube size: 7.0 mm Number of attempts: 1 Airway Equipment and Method: Stylet Placement Confirmation: ETT inserted through vocal cords under direct vision,  positive ETCO2 and breath sounds checked- equal and bilateral Secured at: 21 cm Tube secured with: Tape Dental Injury: Teeth and Oropharynx as per pre-operative assessment  Comments: Glidescope used for surgeon to confirm placement of laryngeal electrode at cords.

## 2016-09-20 NOTE — Op Note (Signed)
09/20/2016  9:38 AM    Regina Grimes  [id number]   Pre-Op Diagnosis:  UNCERTAIN NEOPLASM OF THYROID GLAND  Post-op Diagnosis: SAME  Procedure:   TOTAL THYROIDECTOMY  Surgeon:  [name]    First Assistant: Beverly Gust   Anesthesia:  General endotracheal  EBL:  67HA  Complications:  None  Findings: Large 58m right thyroid nodule  Procedure: After the patient was identified in holding and the procedure was reviewed.  The patient was taken to the operating room and with the patient in a comfortable supine position,  general endotracheal anesthesia was induced without difficulty.  A nerve monitor on the endotracheal tube was visualized to be between the cords at the time of intubation. A proper time-out was performed, confirming the operative site and procedure.  The patient was placed on a shoulder roll and position. Skin was injected with 2% lidocaine with epinephrine 1:100,000. The patient was then prepped and draped in the usual sterile fashion. A 15 blade was used to incise the skin carrying the incision down through the subcutaneous tissues. The platysma muscle was divided with the Bovie. The strap muscles were identified in the midline and divided in the midline, retracting them laterally. Small anterior jugular veins were either clamped and suture divided or divided with the Harmonic scalpel. The strap muscles were retracted laterally off of the capsule of the right lobe of the thyroid gland. This was then finger dissected to loosen loose attachments to the gland. Dissection proceeded superiorly, dissecting the superior pole of the gland. The superior pole vessels were divided with the Harmonic scalpel. A small parathyroid gland was visualized and preserved during this dissection. The superior pole was retracted inferiorly and dissection proceeded around the lateral aspect of the gland, dividing vascular attachments with the Harmonic scalpel. The inferior pole was  dissected, identifying a small structure consistent with the parathyroid, and the inferior pole vessels were divided with the Harmonic scalpel. Care was taken to divide all of these vessels right at the capsule of the gland. The gland was then retracted medially and delivered from the wound. Dissection along the trachea revealed the recurrent laryngeal nerve which stimulated properly. The gland was then dissected off of the trachea, dividing Berry's ligament with the nerve carefully protected.   Next the left lobe of the thyroid gland was dissected in the same fashion as described above. Once again the superior and inferior pole vessels were divided right at the capsule of the gland and structures consistent with parathyroid tissue were identified both superiorly and inferiorly. Retracting the gland medially the recurrent laryngeal nerve was identified and confirmed with the stimulator. This was then carefully protected as the gland was dissected away from the trachea and Berry's ligament divided. The left lobe was delivered with the right lobe and the whole gland sent as a specimen, with the right lobe marked with a stitch.   The wound was then irrigated and inspected for bleeding. #10 TLS drains were placed on either side of the trachea with the drains coming out through the skin just below the wound. The drains were secured with 4-0 Vicryl suture. Surgicel was placed on either side of the trachea to control minor oozing within the wound. The strap muscles were then reapproximated with 4-0 Vicryl suture. The platysma and subcutaneous tissues were also closed with 4-0 Vicryl suture. The skin was closed with a 3-0 running subcuticular Prolene suture. The patient was then returned to the anesthesiologist for awakening. The patient  was awakened and taken to recovery room in good condition postoperatively.  The patient was then returned to the anesthesiologist in good condition for awakening. The patient was  awakened and taken to the recovery room in good condition.   Disposition:   PACU then transferred to floor  Plan: The patient is to be admitted for observation and monitoring of serum calcium, with potential discharge tomorrow if serum calcium is stable overnight.  [name] 09/20/2016 9:38 AM

## 2016-09-20 NOTE — H&P (Signed)
History and physical reviewed and will be scanned in later. No change in medical status reported by the patient or family, appears stable for surgery. Was sick when previously scheduled however that has no resolved on antibiotics and no fever, feels much better. Lungs are clear to auscultation. All questions regarding the procedure answered, and patient (or family if a child) expressed understanding of the procedure.  Regina Grimes '@TODAY'$ @

## 2016-09-20 NOTE — Progress Notes (Signed)
Regina Grimes, Regina Grimes [id number] 01/14/1951 Regina Nearing, MD   SUBJECTIVE: This 65 y.o. year old female is status post THYROIDECTOMY. Doing well, swallowing without difficulty. Minimal discomfort. Has some nausea with morphine, but better now.  Medications:  Current Facility-Administered Medications  Medication Dose Route Frequency Provider Last Rate Last Dose  . bacitracin ointment 1 application  1 application Topical P1W Clyde Canterbury, MD   1 application at 25/85/27 1619  . calcium-vitamin D (OSCAL WITH D) 500-200 MG-UNIT per tablet 2 tablet  2 tablet Oral BID Clyde Canterbury, MD   2 tablet at 09/20/16 1131  . dextrose 5 % and 0.45 % NaCl with KCl 20 mEq/L infusion   Intravenous Continuous Clyde Canterbury, MD 100 mL/hr at 09/20/16 1619    . docusate sodium (COLACE) capsule 100 mg  100 mg Oral BID Clyde Canterbury, MD   100 mg at 09/20/16 1132  . fentaNYL (SUBLIMAZE) 100 MCG/2ML injection           . fentaNYL (SUBLIMAZE) 100 MCG/2ML injection           . HYDROcodone-acetaminophen (NORCO/VICODIN) 5-325 MG per tablet 1-2 tablet  1-2 tablet Oral Q4H PRN Clyde Canterbury, MD      . Derrill Memo ON 09/21/2016] lisinopril (PRINIVIL,ZESTRIL) tablet 10 mg  10 mg Oral Daily Clyde Canterbury, MD      . morphine 4 MG/ML injection 2-4 mg  2-4 mg Intravenous Q2H PRN Clyde Canterbury, MD   4 mg at 09/20/16 1619  . ondansetron (ZOFRAN) tablet 4 mg  4 mg Oral Q4H PRN Clyde Canterbury, MD   4 mg at 09/20/16 1632   Or  . ondansetron [facility]) injection 4 mg  4 mg Intravenous Q4H PRN Clyde Canterbury, MD      . Derrill Memo ON 09/21/2016] pantoprazole (PROTONIX) EC tablet 20 mg  20 mg Oral Daily Clyde Canterbury, MD      .  Medications Prior to Admission  Medication Sig Dispense Refill  . acetaminophen (TYLENOL) 500 MG tablet Take 1,000 mg by mouth every 6 (six) hours as needed (for headache/pain.).    Marland Kitchen calcium carbonate (OSCAL) 1500 (600 Ca) MG TABS tablet Take 600 mg by mouth daily with lunch.    . fluocinonide ointment (LIDEX) 7.82 % Apply 1 application  topically 2 (two) times daily as needed. For psoriasis (Patient taking differently: Apply 1 application topically 2 (two) times daily as needed (For psoriasis). ) 30 g 1  . folic acid (FOLVITE) 1 MG tablet Take 1 mg by mouth as directed. 1 TABLET ONCE A WEEK AS NEEDED WITH METHOTREXATE.    Marland Kitchen lisinopril (PRINIVIL,ZESTRIL) 10 MG tablet Take 1 tablet (10 mg total) by mouth daily. 90 tablet 1  . methotrexate (RHEUMATREX) 2.5 MG tablet TAKE SIX TABLETS BY MOUTH ONCE A WEEK (Patient taking differently: TAKE SIX TABLETS BY MOUTH ONCE A WEEK AS NEEDED FOR PSORASIS FLARE UPS) 24 tablet 5  . naproxen sodium (ANAPROX) 220 MG tablet Take 220 mg by mouth 2 (two) times daily as needed (for pain/heachache).    Marland Kitchen omeprazole (PRILOSEC) 20 MG capsule Take 20 mg by mouth daily.      OBJECTIVE:  PHYSICAL EXAM  Vitals: Blood pressure (!) 159/79, pulse 84, temperature 98.2 F (36.8 C), temperature source Oral, resp. rate 16, height '5\' 5"'$  (1.651 m), weight 160 lb (72.6 kg), SpO2 95 %.. General: Well-developed, Well-nourished in no acute distress Mood: Mood and affect well adjusted, pleasant and cooperative. Orientation: Grossly alert and oriented. Vocal Quality: No hoarseness. Communicates verbally.  Neck: Supple and symmetric with no palpable masses, tenderness or crepitance. The trachea is midline. Wound clean dry and intact. Some ecchymosis around wound but no hematoma. Drains have serosanguinous fluid. Respiratory: Normal respiratory effort without labored breathing.No stridor.  MEDICAL DECISION MAKING: Data Review:  Results for orders placed or performed during the hospital encounter of 09/20/16 (from the past 48 hour(s))  Calcium     Status: Abnormal   Collection Time: 09/20/16  5:19 PM  Result Value Ref Range   Calcium 8.7 (L) 8.9 - 10.3 mg/dL  . No results found..   ASSESSMENT: Doing well after total thyroidectomy. Mild drop in calcium. Will recheck in AM.  PLAN: Follow calcium. Potential d/c in  AM.   Regina Nearing, MD 09/20/2016 6:09 PM

## 2016-09-21 DIAGNOSIS — E049 Nontoxic goiter, unspecified: Secondary | ICD-10-CM | POA: Diagnosis not present

## 2016-09-21 LAB — CALCIUM: CALCIUM: 9 mg/dL (ref 8.9–10.3)

## 2016-09-21 MED ORDER — HYDROCODONE-ACETAMINOPHEN 5-325 MG PO TABS: 1.0000 | ORAL_TABLET | ORAL | 0 refills | Status: DC | PRN

## 2016-09-21 MED ORDER — PANTOPRAZOLE SODIUM 40 MG PO TBEC
40.0000 mg | DELAYED_RELEASE_TABLET | Freq: Every day | ORAL | Status: DC
  Administered 2016-09-21: 40 mg via ORAL
  Filled 2016-09-21: qty 1

## 2016-09-21 MED ORDER — PANTOPRAZOLE SODIUM 20 MG PO TBEC
20.0000 mg | DELAYED_RELEASE_TABLET | Freq: Every day | ORAL | Status: DC
  Filled 2016-09-21: qty 1

## 2016-09-21 NOTE — Progress Notes (Signed)
Pt A and O x 4. VSS. Pt tolerating diet well. No complaints of pain or nausea. IV removed intact, prescriptions given. Pt voiced understanding of discharge instructions with no further questions. Pt had TLS drains removed by MD prior to discharge. Pt discharged via wheelchair with axillary.

## 2016-09-21 NOTE — Final Progress Note (Signed)
Regina Grimes, Regina Grimes [id number] Nov 24, 1950 [name], MD   SUBJECTIVE: This 65 y.o. year old female is status post THYROIDECTOMY. She is doing great and throat feels better. Eating solids today.  Medications:  Current Facility-Administered Medications  Medication Dose Route Frequency Provider Last Rate Last Dose  . bacitracin ointment 1 application  1 application Topical V5I Clyde Canterbury, MD   1 application at 43/32/95 0533  . calcium-vitamin D (OSCAL WITH D) 500-200 MG-UNIT per tablet 2 tablet  2 tablet Oral BID Clyde Canterbury, MD   2 tablet at 09/20/16 2215  . dextrose 5 % and 0.45 % NaCl with KCl 20 mEq/L infusion   Intravenous Continuous Clyde Canterbury, MD 100 mL/hr at 09/20/16 1619    . docusate sodium (COLACE) capsule 100 mg  100 mg Oral BID Clyde Canterbury, MD   100 mg at 09/20/16 2215  . HYDROcodone-acetaminophen (NORCO/VICODIN) 5-325 MG per tablet 1-2 tablet  1-2 tablet Oral Q4H PRN Clyde Canterbury, MD   1 tablet at 09/20/16 2215  . lisinopril (PRINIVIL,ZESTRIL) tablet 10 mg  10 mg Oral Daily Clyde Canterbury, MD      . morphine 4 MG/ML injection 2-4 mg  2-4 mg Intravenous Q2H PRN Clyde Canterbury, MD   4 mg at 09/20/16 1619  . ondansetron (ZOFRAN) tablet 4 mg  4 mg Oral Q4H PRN Clyde Canterbury, MD   4 mg at 09/20/16 1632   Or  . ondansetron [facility]) injection 4 mg  4 mg Intravenous Q4H PRN Clyde Canterbury, MD      . pantoprazole (PROTONIX) EC tablet 20 mg  20 mg Oral Daily Clyde Canterbury, MD      .  Medications Prior to Admission  Medication Sig Dispense Refill  . acetaminophen (TYLENOL) 500 MG tablet Take 1,000 mg by mouth every 6 (six) hours as needed (for headache/pain.).    Marland Kitchen calcium carbonate (OSCAL) 1500 (600 Ca) MG TABS tablet Take 600 mg by mouth daily with lunch.    . fluocinonide ointment (LIDEX) 1.88 % Apply 1 application topically 2 (two) times daily as needed. For psoriasis (Patient taking differently: Apply 1 application topically 2 (two) times daily as needed (For psoriasis). ) 30 g 1  .  folic acid (FOLVITE) 1 MG tablet Take 1 mg by mouth as directed. 1 TABLET ONCE A WEEK AS NEEDED WITH METHOTREXATE.    Marland Kitchen lisinopril (PRINIVIL,ZESTRIL) 10 MG tablet Take 1 tablet (10 mg total) by mouth daily. 90 tablet 1  . methotrexate (RHEUMATREX) 2.5 MG tablet TAKE SIX TABLETS BY MOUTH ONCE A WEEK (Patient taking differently: TAKE SIX TABLETS BY MOUTH ONCE A WEEK AS NEEDED FOR PSORASIS FLARE UPS) 24 tablet 5  . naproxen sodium (ANAPROX) 220 MG tablet Take 220 mg by mouth 2 (two) times daily as needed (for pain/heachache).    Marland Kitchen omeprazole (PRILOSEC) 20 MG capsule Take 20 mg by mouth daily.      OBJECTIVE:  PHYSICAL EXAM  Vitals: Blood pressure (!) 163/74, pulse 72, temperature 97.8 F (36.6 C), temperature source Oral, resp. rate 16, height '5\' 5"'$  (1.651 m), weight 160 lb (72.6 kg), SpO2 97 %.. General: Well-developed, Well-nourished in no acute distress Mood: Mood and affect well adjusted, pleasant and cooperative. Orientation: Grossly alert and oriented. Vocal Quality: No hoarseness. Communicates verbally. Neck: Supple and symmetric with no palpable masses, tenderness or crepitance. The trachea is midline. Wound clean, dry, intact. No hematoma. Drains removed Respiratory: Normal respiratory effort without labored breathing.  MEDICAL DECISION MAKING: Data Review:  Results for  orders placed or performed during the hospital encounter of 09/20/16 (from the past 48 hour(s))  Calcium     Status: Abnormal   Collection Time: 09/20/16  5:19 PM  Result Value Ref Range   Calcium 8.7 (L) 8.9 - 10.3 mg/dL  Calcium     Status: None   Collection Time: 09/21/16  5:18 AM  Result Value Ref Range   Calcium 9.0 8.9 - 10.3 mg/dL  . No results found..   ASSESSMENT: Doing great. Calcium normal. Drains out.  PLAN: D/c home on calcium. F/u in 1 week for suture removal   [name], MD 09/21/2016 8:15 AM

## 2016-09-22 LAB — SURGICAL PATHOLOGY

## 2016-09-26 ENCOUNTER — Inpatient Hospital Stay: Payer: 59 | Admitting: Internal Medicine

## 2016-10-03 ENCOUNTER — Ambulatory Visit: Payer: 59 | Admitting: Internal Medicine

## 2016-10-05 ENCOUNTER — Ambulatory Visit: Payer: 59 | Admitting: Internal Medicine

## 2016-11-06 ENCOUNTER — Encounter: Payer: Self-pay | Admitting: Internal Medicine

## 2016-11-07 MED ORDER — LEVOTHYROXINE SODIUM 100 MCG PO TABS: 100.0000 ug | ORAL_TABLET | Freq: Every day | ORAL | 3 refills | Status: DC

## 2016-11-07 NOTE — Telephone Encounter (Signed)
Please send a prescription for 1 year for levothyroxine 153mg I will recheck at her June visit

## 2016-12-20 ENCOUNTER — Ambulatory Visit (INDEPENDENT_AMBULATORY_CARE_PROVIDER_SITE_OTHER): Payer: 59 | Admitting: Family Medicine

## 2016-12-20 ENCOUNTER — Encounter: Payer: Self-pay | Admitting: Family Medicine

## 2016-12-20 VITALS — BP 162/76 | HR 84 | Temp 98.5°F | Resp 18 | Wt 162.0 lb

## 2016-12-20 DIAGNOSIS — R059 Cough, unspecified: Secondary | ICD-10-CM

## 2016-12-20 DIAGNOSIS — R05 Cough: Secondary | ICD-10-CM | POA: Diagnosis not present

## 2016-12-20 DIAGNOSIS — J0110 Acute frontal sinusitis, unspecified: Secondary | ICD-10-CM | POA: Diagnosis not present

## 2016-12-20 MED ORDER — HYDROCODONE-ACETAMINOPHEN 5-325 MG PO TABS: 1.0000 | ORAL_TABLET | ORAL | 0 refills | Status: DC | PRN

## 2016-12-20 MED ORDER — HYDROCODONE-HOMATROPINE 5-1.5 MG/5ML PO SYRP: 5.0000 mL | ORAL_SOLUTION | Freq: Three times a day (TID) | ORAL | 0 refills | Status: DC | PRN

## 2016-12-20 MED ORDER — AMOXICILLIN 875 MG PO TABS: 875.0000 mg | ORAL_TABLET | Freq: Two times a day (BID) | ORAL | 0 refills | Status: DC

## 2016-12-20 NOTE — Progress Notes (Signed)
Subjective:    Patient ID: Regina Grimes, female    DOB: 1950-11-25, 66 y.o.   MRN: [id number]  HPI This is a 66 yo female who presents today with nasal congestion and fever x 2 weeks. Phlegm is white and milky. Nasal drainage is green. Occasional headaches, some sinus pressure. Has been taking Alka seltzer and half of a 12 hour sudafed. Has had frequent sinus infections in the past.  She feels very fatigued since thyroidectomy. No edema. Surgical site has healed well. She returned to work quickly.   Past Medical History:  Diagnosis Date  . Arthritis    arm  . Cancer [facility]) 10/2011   Left Lung cancer with Partial Lobectomy.  . Coughing up blood    10/2011, admitted to [place]   . GERD (gastroesophageal reflux disease)   . Hiatal hernia   . Hyperlipidemia   . Hypertension    treated for while in hosp. 10/2011, given med. , unsure of name of med., not told to continue   . Osteopenia   . Psoriasis    has used methotrexate since she was 66y.o., when psoriasis flares. Currently methotrexate on hold since 11/05/2011   . Recurrent upper respiratory infection (URI)    lung infection, treated /w antibiotic - 10/2011   Past Surgical History:  Procedure Laterality Date  . abnormal PAP     low grade intrepithelial lesion. Colposcipy okay  . APPENDECTOMY     1982- along /w tubal ligation   . Benign tumors of left foot  1992/1994  . BREAST BIOPSY Right 2013  . COLONOSCOPY  8/02  . ESOPHAGOGASTRODUODENOSCOPY  8/02  . FOOT NEUROMA SURGERY     L foot- 1990's  . LUNG REMOVAL, PARTIAL    . THYROIDECTOMY N/A 09/20/2016   Procedure: THYROIDECTOMY;  Surgeon: Clyde Canterbury, MD;  Location: [facility] ORS;  Service: ENT;  Laterality: N/A;  . [place]  . VD     x 2  . VIDEO BRONCHOSCOPY  11/16/2011   Procedure: VIDEO BRONCHOSCOPY WITH FLUORO;  Surgeon: Leanna Sato. Elsworth Soho, MD;  Location: [facility];  Service: Cardiopulmonary;  Laterality: Bilateral;  . VIDEO BRONCHOSCOPY  12/21/2011   Procedure: VIDEO BRONCHOSCOPY;  Surgeon: Gaye Pollack, MD;  Location: [facility] OR;  Service: Thoracic;  Laterality: N/A;   Family History  Problem Relation Age of Onset  . Coronary artery disease Mother   . Heart disease Mother     atrial fibrillation  . Lung cancer Father   . Lung cancer Brother   . Breast cancer Paternal Aunt 53  . Cirrhosis Maternal Grandmother   . Anesthesia problems Neg Hx    Social History  Substance Use Topics  . Smoking status: Former Smoker    Packs/day: 1.00    Years: 25.00    Types: Cigarettes    Quit date: 06/25/2010  . Smokeless tobacco: Never Used  . Alcohol use No      Review of Systems Per HPI    Objective:   Physical Exam  Constitutional: She is oriented to person, place, and time. She appears well-developed and well-nourished. No distress.  HENT:  Head: Normocephalic and atraumatic.  Right Ear: Tympanic membrane and ear canal normal.  Left Ear: Tympanic membrane and ear canal normal.  Nose: Mucosal edema, rhinorrhea and septal deviation present. Right sinus exhibits maxillary sinus tenderness. Right sinus exhibits no frontal sinus tenderness. Left sinus exhibits maxillary sinus tenderness. Left sinus exhibits no frontal sinus tenderness.  Mouth/Throat: Uvula is  midline, oropharynx is clear and moist and mucous membranes are normal.  Eyes: Conjunctivae are normal.  Neck: Normal range of motion. Neck supple.  Well healed thyroidectomy scar.  Cardiovascular: Normal rate, regular rhythm and normal heart sounds.   Pulmonary/Chest: Effort normal and breath sounds normal.  Lymphadenopathy:    She has no cervical adenopathy.  Neurological: She is alert and oriented to person, place, and time.  Skin: Skin is warm and dry. She is not diaphoretic.  Psychiatric: She has a normal mood and affect. Her behavior is normal. Judgment and thought content normal.  Vitals reviewed.     BP (!) 162/76 (BP Location: Right Arm, Patient Position: Sitting, Cuff  Size: Normal)   Pulse 84   Temp 98.5 F (36.9 C) (Oral)   Resp 18   Wt 162 lb (73.5 kg)   SpO2 98%   BMI 26.96 kg/m  Wt Readings from Last 3 Encounters:  12/20/16 162 lb (73.5 kg)  09/20/16 160 lb (72.6 kg)  09/06/16 160 lb (72.6 kg)   BP Readings from Last 3 Encounters:  12/20/16 (!) 162/76  09/21/16 (!) 179/72  09/06/16 (!) 160/90       Assessment & Plan:  1. Acute non-recurrent frontal sinusitis - Provided written and verbal information regarding diagnosis and treatment. - amoxicillin (AMOXIL) 875 MG tablet; Take 1 tablet (875 mg total) by mouth 2 (two) times daily.  Dispense: 14 tablet; Refill: 0 - RTC/ED precautions reviewed  2. Cough - HYDROcodone-homatropine (HYCODAN) 5-1.5 MG/5ML syrup; Take 5 mLs by mouth every 8 (eight) hours as needed for cough.  Dispense: 120 mL; Refill: 0   Clarene Reamer, FNP-BC  [facility], [facility]  12/20/2016 2:30 PM

## 2016-12-20 NOTE — Progress Notes (Signed)
Pre visit review using our clinic review tool, if applicable. No additional management support is needed unless otherwise documented below in the visit note. 

## 2016-12-20 NOTE — Patient Instructions (Addendum)
For nasal congestion you can use Afrin nasal spray for 3 days max, Sudafed, saline nasal spray (generic is fine for all). Drink enough fluids to make your urine light yellow. For fever/chill/muscle aches you can take over the counter acetaminophen or ibuprofen.  Please come back in if you are not better in 5-7 days or if you develop wheezing, shortness of breath or persistent vomiting.    Upper Respiratory Infection, Adult Most upper respiratory infections (URIs) are a viral infection of the air passages leading to the lungs. A URI affects the nose, throat, and upper air passages. The most common type of URI is nasopharyngitis and is typically referred to as "the common cold." URIs run their course and usually go away on their own. Most of the time, a URI does not require medical attention, but sometimes a bacterial infection in the upper airways can follow a viral infection. This is called a secondary infection. Sinus and middle ear infections are common types of secondary upper respiratory infections. Bacterial pneumonia can also complicate a URI. A URI can worsen asthma and chronic obstructive pulmonary disease (COPD). Sometimes, these complications can require emergency medical care and may be life threatening. What are the causes? Almost all URIs are caused by viruses. A virus is a type of germ and can spread from one person to another. What increases the risk? You may be at risk for a URI if:  You smoke.  You have chronic heart or lung disease.  You have a weakened defense (immune) system.  You are very young or very old.  You have nasal allergies or asthma.  You work in crowded or poorly ventilated areas.  You work in health care facilities or schools. What are the signs or symptoms? Symptoms typically develop 2-3 days after you come in contact with a cold virus. Most viral URIs last 7-10 days. However, viral URIs from the influenza virus (flu virus) can last 14-18 days and are  typically more severe. Symptoms may include:  Runny or stuffy (congested) nose.  Sneezing.  Cough.  Sore throat.  Headache.  Fatigue.  Fever.  Loss of appetite.  Pain in your forehead, behind your eyes, and over your cheekbones (sinus pain).  Muscle aches. How is this diagnosed? Your health care provider may diagnose a URI by:  Physical exam.  Tests to check that your symptoms are not due to another condition such as:  Strep throat.  Sinusitis.  Pneumonia.  Asthma. How is this treated? A URI goes away on its own with time. It cannot be cured with medicines, but medicines may be prescribed or recommended to relieve symptoms. Medicines may help:  Reduce your fever.  Reduce your cough.  Relieve nasal congestion. Follow these instructions at home:  Take medicines only as directed by your health care provider.  Gargle warm saltwater or take cough drops to comfort your throat as directed by your health care provider.  Use a warm mist humidifier or inhale steam from a shower to increase air moisture. This may make it easier to breathe.  Drink enough fluid to keep your urine clear or pale yellow.  Eat soups and other clear broths and maintain good nutrition.  Rest as needed.  Return to work when your temperature has returned to normal or as your health care provider advises. You may need to stay home longer to avoid infecting others. You can also use a face mask and careful hand washing to prevent spread of the virus.  Increase  the usage of your inhaler if you have asthma.  Do not use any tobacco products, including cigarettes, chewing tobacco, or electronic cigarettes. If you need help quitting, ask your health care provider. How is this prevented? The best way to protect yourself from getting a cold is to practice good hygiene.  Avoid oral or hand contact with people with cold symptoms.  Wash your hands often if contact occurs. There is no clear evidence  that vitamin C, vitamin E, echinacea, or exercise reduces the chance of developing a cold. However, it is always recommended to get plenty of rest, exercise, and practice good nutrition. Contact a health care provider if:  You are getting worse rather than better.  Your symptoms are not controlled by medicine.  You have chills.  You have worsening shortness of breath.  You have brown or red mucus.  You have yellow or brown nasal discharge.  You have pain in your face, especially when you bend forward.  You have a fever.  You have swollen neck glands.  You have pain while swallowing.  You have white areas in the back of your throat. Get help right away if:  You have severe or persistent:  Headache.  Ear pain.  Sinus pain.  Chest pain.  You have chronic lung disease and any of the following:  Wheezing.  Prolonged cough.  Coughing up blood.  A change in your usual mucus.  You have a stiff neck.  You have changes in your:  Vision.  Hearing.  Thinking.  Mood. This information is not intended to replace advice given to you by your health care provider. Make sure you discuss any questions you have with your health care provider. Document Released: 03/07/2001 Document Revised: 05/14/2016 Document Reviewed: 12/17/2013 Elsevier Interactive Patient Education  2017 [employer].

## 2017-02-12 ENCOUNTER — Encounter: Payer: Self-pay | Admitting: Internal Medicine

## 2017-02-21 ENCOUNTER — Other Ambulatory Visit: Payer: Self-pay | Admitting: Family Medicine

## 2017-02-21 DIAGNOSIS — I10 Essential (primary) hypertension: Secondary | ICD-10-CM

## 2017-02-26 NOTE — Telephone Encounter (Signed)
Pt request refill lisinopril to [facility] [address]. Pt last seen and rx last refilled # 90 x 1 on 08/31/16. Refilled per protocol; pt has CPX scheduled 04/02/17 and will get rx refills updated at that appt. Will refill # 90 today. Pt voiced understanding.

## 2017-03-16 ENCOUNTER — Encounter: Payer: 59 | Admitting: Internal Medicine

## 2017-04-02 ENCOUNTER — Ambulatory Visit (INDEPENDENT_AMBULATORY_CARE_PROVIDER_SITE_OTHER): Payer: 59 | Admitting: Internal Medicine

## 2017-04-02 ENCOUNTER — Encounter: Payer: Self-pay | Admitting: Internal Medicine

## 2017-04-02 VITALS — BP 132/86 | HR 82 | Temp 97.4°F | Ht 65.0 in | Wt 161.0 lb

## 2017-04-02 DIAGNOSIS — Z85118 Personal history of other malignant neoplasm of bronchus and lung: Secondary | ICD-10-CM | POA: Diagnosis not present

## 2017-04-02 DIAGNOSIS — E890 Postprocedural hypothyroidism: Secondary | ICD-10-CM

## 2017-04-02 DIAGNOSIS — L409 Psoriasis, unspecified: Secondary | ICD-10-CM

## 2017-04-02 DIAGNOSIS — I10 Essential (primary) hypertension: Secondary | ICD-10-CM | POA: Diagnosis not present

## 2017-04-02 DIAGNOSIS — Z0000 Encounter for general adult medical examination without abnormal findings: Secondary | ICD-10-CM

## 2017-04-02 NOTE — Assessment & Plan Note (Signed)
Will check labs

## 2017-04-02 NOTE — Assessment & Plan Note (Signed)
No problems with lisinopril

## 2017-04-02 NOTE — Assessment & Plan Note (Signed)
Uses MTX intermittently

## 2017-04-02 NOTE — Assessment & Plan Note (Signed)
Reviewed colonoscopy --done 2012 and no polyps Mammogram done 10/17--may want to continue yearly for now UTD on immunizations Discussed exercise Will check labs with the fatigue

## 2017-04-02 NOTE — Assessment & Plan Note (Signed)
Follows with Dr B now

## 2017-04-02 NOTE — Progress Notes (Signed)
Subjective:    Patient ID: Regina Grimes, female    DOB: Sep 19, 1951, 66 y.o.   MRN: [id number]  HPI Here for physical Tired all the time--- going on since the thyroidectomy in December Did have 1 follow up thyroid test since then Has never felt like this before  Takes the methotrexate just intermittently Averaging 1-2 doses per month--- just takes it to clear the psoriasis (then often can go a month without)  Still on lisinopril--long standing Regular prilosec for GERD--controls her symptoms  Now seeing Dr Burlene Arnt for the history of lung cancer Some breast tenderness on left--- has seen Dr Bary Castilla in the past for this  Low grade fever still Seen at urgent care--- treated for sinus infection about a month ago  Current Outpatient Prescriptions on File Prior to Visit  Medication Sig Dispense Refill  . acetaminophen (TYLENOL) 500 MG tablet Take 1,000 mg by mouth every 6 (six) hours as needed (for headache/pain.).    Marland Kitchen fluocinonide ointment (LIDEX) 3.97 % Apply 1 application topically 2 (two) times daily as needed. For psoriasis (Patient taking differently: Apply 1 application topically 2 (two) times daily as needed (For psoriasis). ) 30 g 1  . folic acid (FOLVITE) 1 MG tablet Take 1 mg by mouth as directed. 1 TABLET ONCE A WEEK AS NEEDED WITH METHOTREXATE.    Marland Kitchen levothyroxine (SYNTHROID, LEVOTHROID) 100 MCG tablet Take 1 tablet (100 mcg total) by mouth daily. 90 tablet 3  . lisinopril (PRINIVIL,ZESTRIL) 10 MG tablet TAKE ONE TABLET BY MOUTH ONCE DAILY 90 tablet 0  . methotrexate (RHEUMATREX) 2.5 MG tablet TAKE SIX TABLETS BY MOUTH ONCE A WEEK (Patient taking differently: TAKE SIX TABLETS BY MOUTH ONCE A WEEK AS NEEDED FOR PSORASIS FLARE UPS) 24 tablet 5  . omeprazole (PRILOSEC) 20 MG capsule Take 20 mg by mouth daily.     No current facility-administered medications on file prior to visit.     Allergies  Allergen Reactions  . Aspirin     REACTION: stomach burning  . Codeine       Upset stomach  . Pravastatin Sodium     REACTION: mood changes and snapping at everyone (pt. Is unaware of this allergy)     Past Medical History:  Diagnosis Date  . Arthritis    arm  . Cancer [facility]) 10/2011   Left Lung cancer with Partial Lobectomy.  . Coughing up blood    10/2011, admitted to [place]   . GERD (gastroesophageal reflux disease)   . Hiatal hernia   . Hyperlipidemia   . Hypertension    treated for while in hosp. 10/2011, given med. , unsure of name of med., not told to continue   . Osteopenia   . Psoriasis    has used methotrexate since she was 66y.o., when psoriasis flares. Currently methotrexate on hold since 11/05/2011   . Recurrent upper respiratory infection (URI)    lung infection, treated /w antibiotic - 10/2011    Past Surgical History:  Procedure Laterality Date  . abnormal PAP     low grade intrepithelial lesion. Colposcipy okay  . APPENDECTOMY     1982- along /w tubal ligation   . Benign tumors of left foot  1992/1994  . BREAST BIOPSY Right 2013  . COLONOSCOPY  8/02  . ESOPHAGOGASTRODUODENOSCOPY  8/02  . FOOT NEUROMA SURGERY     L foot- 1990's  . LUNG REMOVAL, PARTIAL    . THYROIDECTOMY N/A 09/20/2016   Procedure: THYROIDECTOMY;  Surgeon: Eddie Dibbles  Richardson Landry, MD;  Location: [facility] ORS;  Service: ENT;  Laterality: N/A;  . [place]  . VD     x 2  . VIDEO BRONCHOSCOPY  11/16/2011   Procedure: VIDEO BRONCHOSCOPY WITH FLUORO;  Surgeon: Leanna Sato. Elsworth Soho, MD;  Location: [facility];  Service: Cardiopulmonary;  Laterality: Bilateral;  . VIDEO BRONCHOSCOPY  12/21/2011   Procedure: VIDEO BRONCHOSCOPY;  Surgeon: Gaye Pollack, MD;  Location: [facility] OR;  Service: Thoracic;  Laterality: N/A;    Family History  Problem Relation Age of Onset  . Coronary artery disease Mother   . Heart disease Mother        atrial fibrillation  . Lung cancer Father   . Lung cancer Brother   . Breast cancer Paternal Aunt 80  . Cirrhosis Maternal Grandmother   .  Anesthesia problems Neg Hx     Social History   Social History  . Marital status: Married    Spouse name: N/A  . Number of children: N/A  . Years of education: N/A   Occupational History  . Lab [employer]   Social History Main Topics  . Smoking status: Former Smoker    Packs/day: 1.00    Years: 25.00    Types: Cigarettes    Quit date: 06/25/2010  . Smokeless tobacco: Never Used  . Alcohol use No  . Drug use: No  . Sexual activity: Not on file   Other Topics Concern  . Not on file   Social History Narrative  . No narrative on file   Review of Systems  Constitutional: Positive for fatigue. Negative for unexpected weight change.       Appetite is okay Always wears seat belt  HENT: Negative for hearing loss, tinnitus and trouble swallowing.        Full dentures  Eyes: Negative for visual disturbance.       No diplopia or unilateral vision loss  Respiratory: Positive for cough. Negative for shortness of breath.   Cardiovascular: Negative for chest pain, palpitations and leg swelling.  Gastrointestinal: Positive for constipation. Negative for blood in stool.       Uses correctol prn for bowels Uses activia daily  Endocrine: Negative for polydipsia and polyuria.  Genitourinary: Negative for difficulty urinating, dyspareunia, dysuria and hematuria.  Musculoskeletal: Positive for arthralgias. Negative for back pain and joint swelling.       Mild pain in hands  Skin: Positive for rash.  Allergic/Immunologic: Positive for environmental allergies. Negative for immunocompromised state.  Neurological: Negative for syncope.       Weak and dizzy at [facility] with illness---had to sit down Rare headache  Hematological: Does not bruise/bleed easily.       Swollen glands in neck now  Psychiatric/Behavioral: Positive for sleep disturbance. Negative for dysphoric mood. The patient is not nervous/anxious.        Stress with mom's worsening dementia       Objective:     Physical Exam  Constitutional: She is oriented to person, place, and time. She appears well-nourished. No distress.  HENT:  Head: Normocephalic and atraumatic.  Right Ear: External ear normal.  Left Ear: External ear normal.  Mouth/Throat: Oropharynx is clear and moist. No oropharyngeal exudate.  Eyes: Conjunctivae are normal. Pupils are equal, round, and reactive to light.  Neck: Normal range of motion. No thyromegaly present.  Cardiovascular: Normal rate, regular rhythm, normal heart sounds and intact distal pulses.  Exam reveals no gallop.  No murmur heard. Pulmonary/Chest: Effort normal and breath sounds normal. No respiratory distress. She has no wheezes. She has no rales.  Abdominal: Soft. There is no tenderness.  Genitourinary:  Genitourinary Comments: Cystic changes in both breasts. No worrisome masses  Musculoskeletal: She exhibits no edema or tenderness.  Lymphadenopathy:    She has no cervical adenopathy.  Neurological: She is alert and oriented to person, place, and time.  Skin: Skin is warm. No erythema.  Psychiatric: She has a normal mood and affect. Her behavior is normal.          Assessment & Plan:

## 2017-04-03 ENCOUNTER — Other Ambulatory Visit: Payer: Self-pay | Admitting: Internal Medicine

## 2017-04-03 DIAGNOSIS — D649 Anemia, unspecified: Secondary | ICD-10-CM

## 2017-04-03 LAB — CBC WITH DIFFERENTIAL/PLATELET
BASOS: 0 %
Basophils Absolute: 0 10*3/uL (ref 0.0–0.2)
EOS (ABSOLUTE): 0.5 10*3/uL — ABNORMAL HIGH (ref 0.0–0.4)
EOS: 6 %
HEMATOCRIT: 32.6 % — AB (ref 34.0–46.6)
HEMOGLOBIN: 10.2 g/dL — AB (ref 11.1–15.9)
IMMATURE GRANULOCYTES: 0 %
Immature Grans (Abs): 0 10*3/uL (ref 0.0–0.1)
LYMPHS ABS: 2 10*3/uL (ref 0.7–3.1)
Lymphs: 26 %
MCH: 25.6 pg — ABNORMAL LOW (ref 26.6–33.0)
MCHC: 31.3 g/dL — AB (ref 31.5–35.7)
MCV: 82 fL (ref 79–97)
MONOCYTES: 7 %
MONOS ABS: 0.5 10*3/uL (ref 0.1–0.9)
Neutrophils Absolute: 4.7 10*3/uL (ref 1.4–7.0)
Neutrophils: 61 %
Platelets: 293 10*3/uL (ref 150–379)
RBC: 3.99 x10E6/uL (ref 3.77–5.28)
RDW: 17.7 % — AB (ref 12.3–15.4)
WBC: 7.8 10*3/uL (ref 3.4–10.8)

## 2017-04-03 LAB — COMPREHENSIVE METABOLIC PANEL
ALBUMIN: 4.2 g/dL (ref 3.6–4.8)
ALT: 18 IU/L (ref 0–32)
AST: 18 IU/L (ref 0–40)
Albumin/Globulin Ratio: 1.4 (ref 1.2–2.2)
Alkaline Phosphatase: 120 IU/L — ABNORMAL HIGH (ref 39–117)
BUN / CREAT RATIO: 15 (ref 12–28)
BUN: 11 mg/dL (ref 8–27)
Bilirubin Total: 0.3 mg/dL (ref 0.0–1.2)
CALCIUM: 9 mg/dL (ref 8.7–10.3)
CO2: 26 mmol/L (ref 20–29)
CREATININE: 0.73 mg/dL (ref 0.57–1.00)
Chloride: 100 mmol/L (ref 96–106)
GFR, EST AFRICAN AMERICAN: 101 mL/min/{1.73_m2} (ref >59)
GFR, EST NON AFRICAN AMERICAN: 88 mL/min/{1.73_m2} (ref >59)
GLOBULIN, TOTAL: 3.1 g/dL (ref 1.5–4.5)
Glucose: 103 mg/dL — ABNORMAL HIGH (ref 65–99)
Potassium: 3.8 mmol/L (ref 3.5–5.2)
SODIUM: 141 mmol/L (ref 134–144)
TOTAL PROTEIN: 7.3 g/dL (ref 6.0–8.5)

## 2017-04-03 LAB — T4, FREE: Free T4: 1.63 ng/dL (ref 0.82–1.77)

## 2017-04-03 LAB — LIPID PANEL
CHOL/HDL RATIO: 7.6 ratio — AB (ref 0.0–4.4)
Cholesterol, Total: 214 mg/dL — ABNORMAL HIGH (ref 100–199)
HDL: 28 mg/dL — ABNORMAL LOW (ref >39)
LDL CALC: 119 mg/dL — AB (ref 0–99)
TRIGLYCERIDES: 336 mg/dL — AB (ref 0–149)
VLDL CHOLESTEROL CAL: 67 mg/dL — AB (ref 5–40)

## 2017-04-03 LAB — T3, FREE: T3, Free: 2.6 pg/mL (ref 2.0–4.4)

## 2017-04-03 LAB — TSH: TSH: 0.129 u[IU]/mL — ABNORMAL LOW (ref 0.450–4.500)

## 2017-04-03 IMAGING — US US THYROID BIOPSY
1 series · 9 of 9 positions shown · non-contrast
Comparison: Ultrasound November 05, 2015.

INDICATION: Right thyroid nodule.

EXAM:
ULTRASOUND GUIDED NEEDLE ASPIRATE BIOPSY OF THE THYROID GLAND

[Series 1: us thyroid biopsy · 0.07mm/px · 9 of 9 slices shown]
[im 1/9]
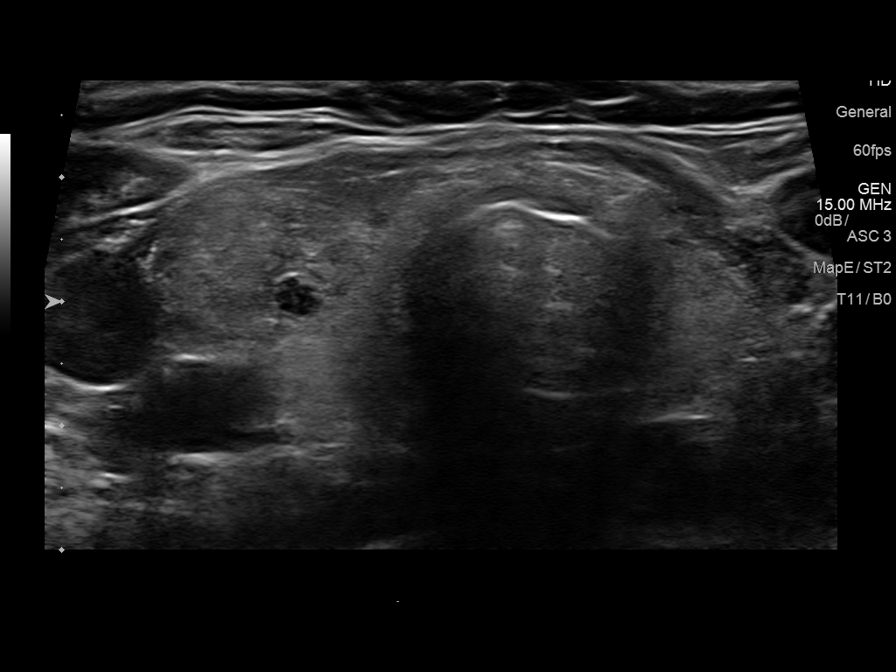
[im 2/9]
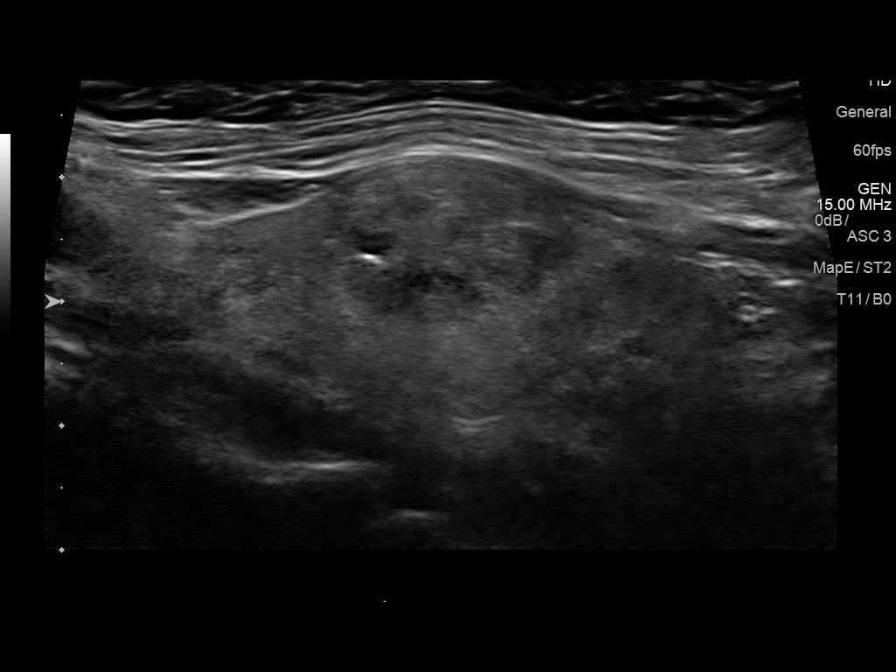
[im 3/9]
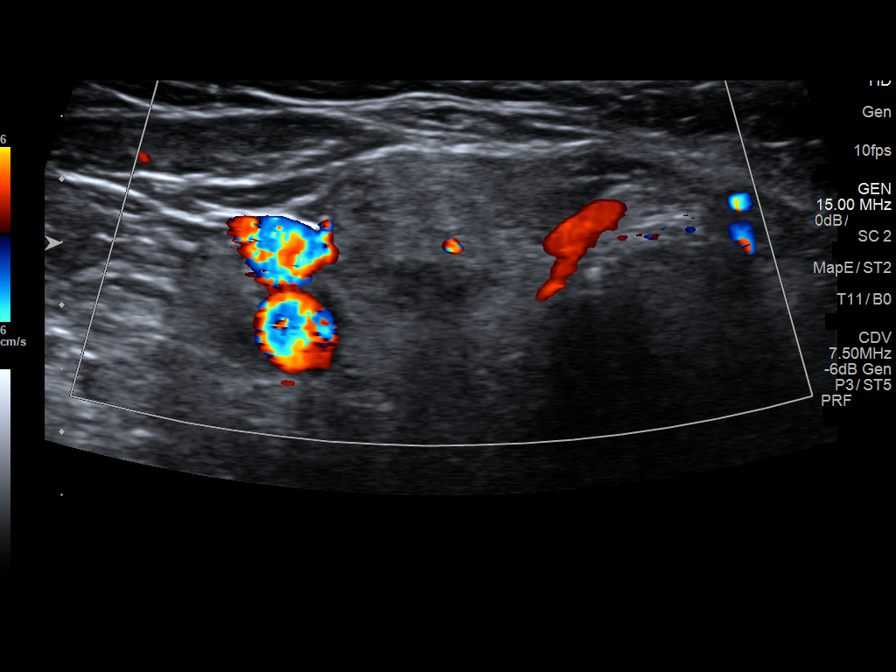
[im 4/9]
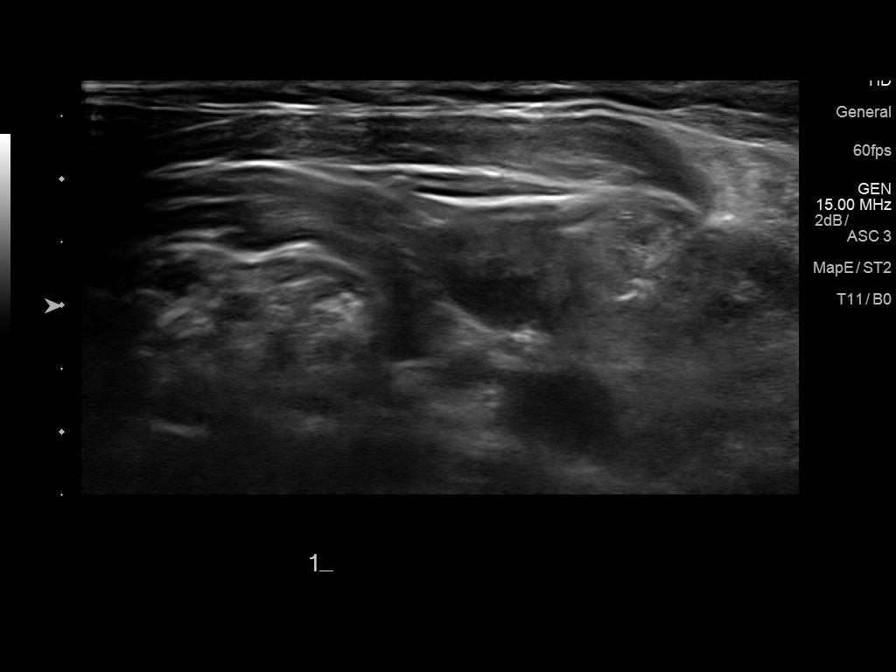
[im 5/9]
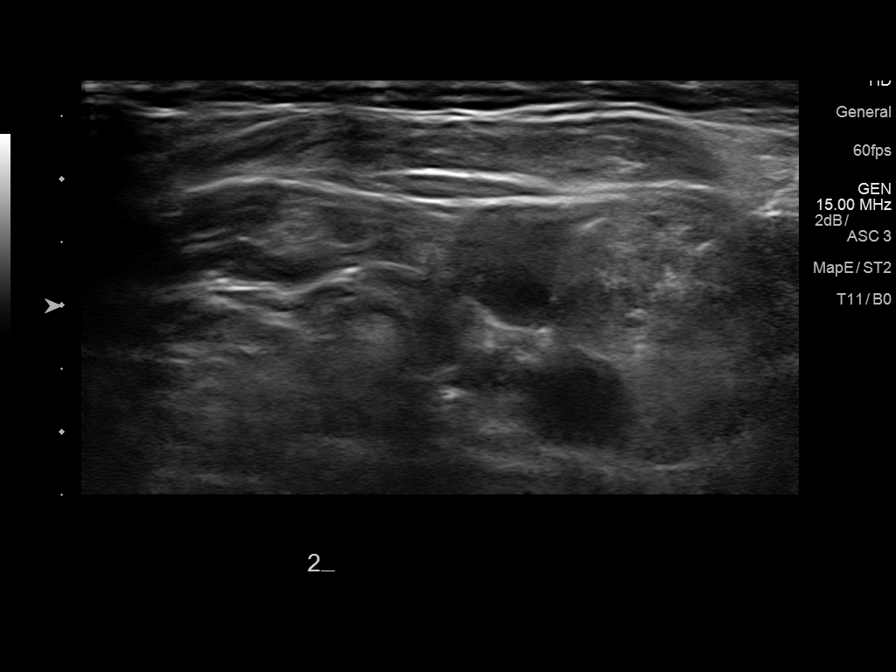
[im 6/9]
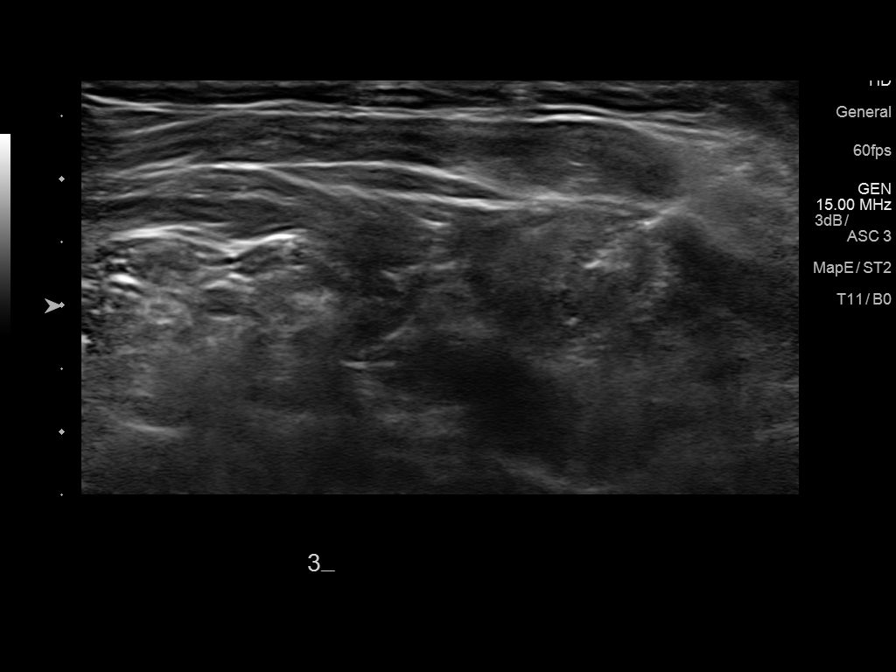
[im 7/9]
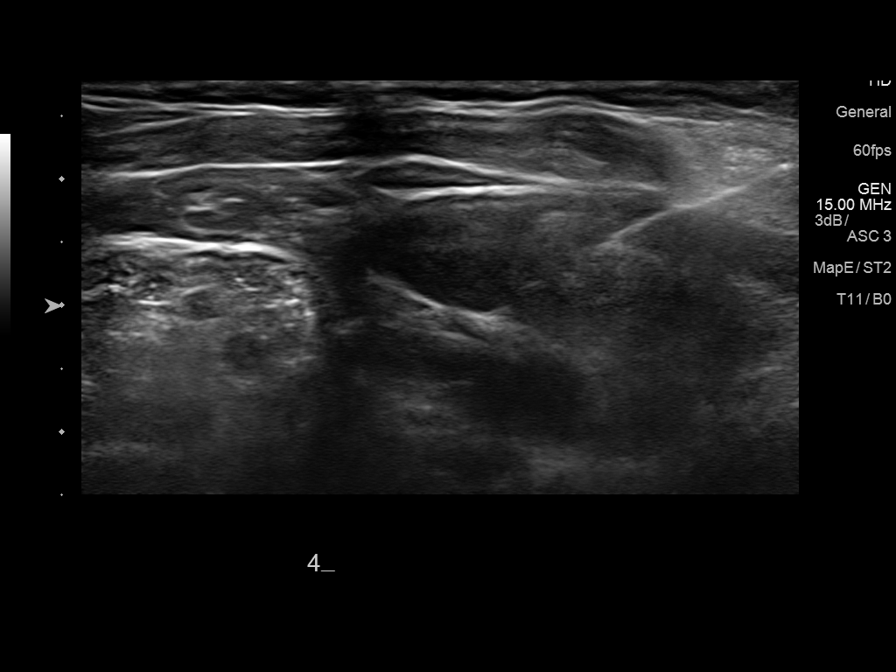
[im 8/9]
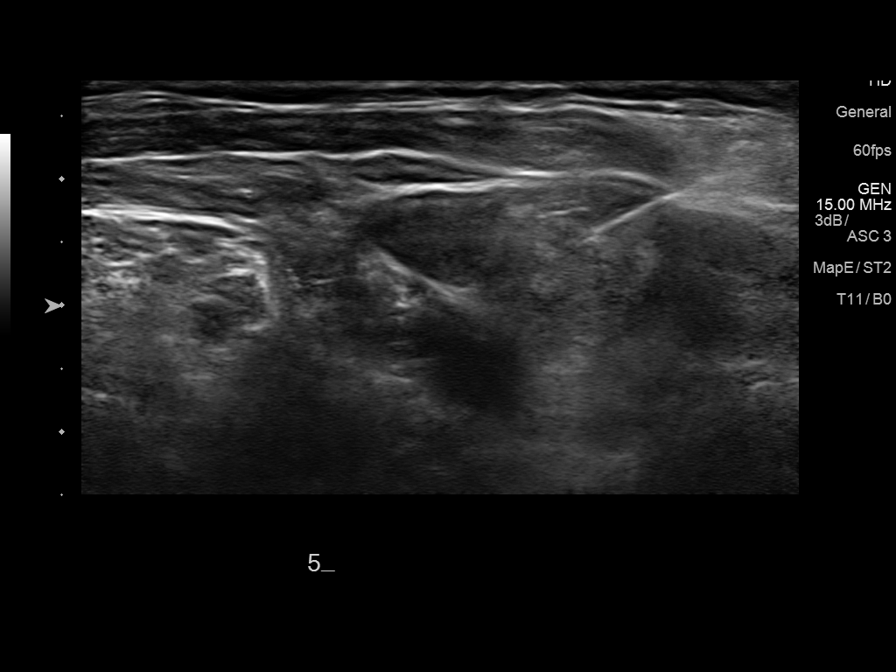
[im 9/9]
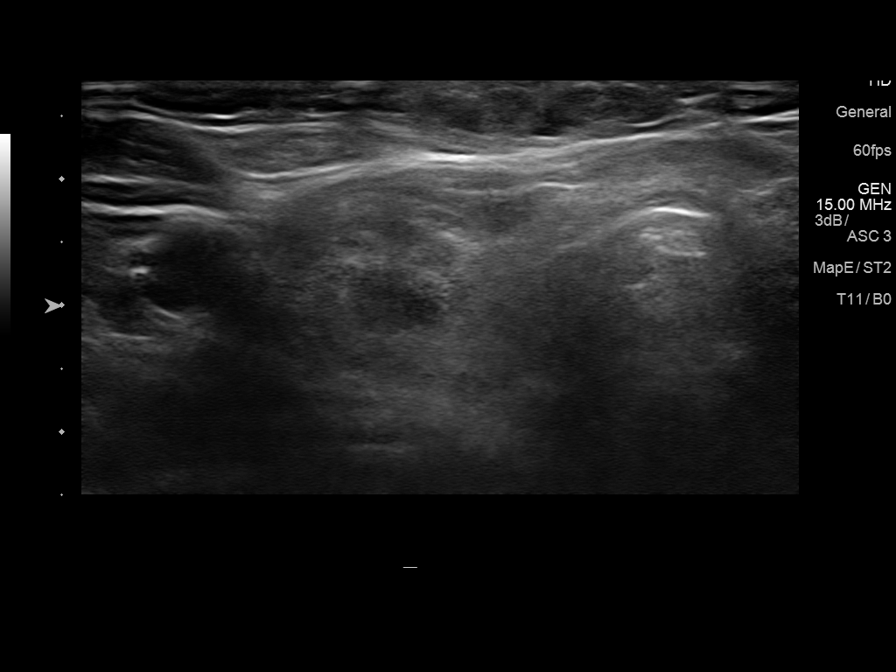

[9 of 9 positions shown; findings below may reference images not displayed]

MEDICATIONS:
1% lidocaine

COMPLICATIONS:
None immediate.

PROCEDURE:
Informed written consent was obtained from the patient after a
thorough discussion of the procedural risks, benefits and
alternatives. All questions were addressed. Maximal Sterile Barrier
Technique was utilized including mask, sterile gloves, sterile
drape, hand hygiene and skin antiseptic. A timeout was performed
prior to the initiation of the procedure.

Ultrasound was performed to localize and mark an adequate site for
the biopsy. The patient was then prepped and draped in a normal
sterile fashion. Local anesthesia was provided with 1% lidocaine.
Using direct ultrasound guidance, 5 passes were made using needles
into the nodule within the right lobe of the thyroid. Ultrasound was
used to confirm needle placements on all occasions. Specimens were
sent to Pathology for analysis.
IMPRESSION: Ultrasound guided needle aspirate biopsy performed of the right
thyroid nodule.

## 2017-04-03 NOTE — Progress Notes (Signed)
Please mail her a fecal test kit

## 2017-04-17 ENCOUNTER — Other Ambulatory Visit (INDEPENDENT_AMBULATORY_CARE_PROVIDER_SITE_OTHER): Payer: 59

## 2017-04-17 DIAGNOSIS — D649 Anemia, unspecified: Secondary | ICD-10-CM

## 2017-04-17 LAB — FECAL OCCULT BLOOD, IMMUNOCHEMICAL: FECAL OCCULT BLD: NEGATIVE

## 2017-05-07 ENCOUNTER — Other Ambulatory Visit: Payer: Self-pay | Admitting: Internal Medicine

## 2017-05-08 NOTE — Telephone Encounter (Signed)
Received refill electronically Last refill 04/17/16 #24/5 Last office visit 04/02/17

## 2017-05-08 NOTE — Telephone Encounter (Signed)
Approved: #[id number] x 5

## 2017-05-15 ENCOUNTER — Other Ambulatory Visit: Payer: Self-pay | Admitting: Internal Medicine

## 2017-05-15 DIAGNOSIS — I10 Essential (primary) hypertension: Secondary | ICD-10-CM

## 2017-06-15 ENCOUNTER — Other Ambulatory Visit: Payer: Self-pay | Admitting: Internal Medicine

## 2017-06-15 DIAGNOSIS — Z1231 Encounter for screening mammogram for malignant neoplasm of breast: Secondary | ICD-10-CM

## 2017-07-02 ENCOUNTER — Ambulatory Visit
Admission: RE | Admit: 2017-07-02 | Discharge: 2017-07-02 | Disposition: A | Discharge status: Home / Self Care | Payer: 59 | Source: Ambulatory Visit | Attending: Internal Medicine | Admitting: Internal Medicine

## 2017-07-02 DIAGNOSIS — Z1231 Encounter for screening mammogram for malignant neoplasm of breast: Secondary | ICD-10-CM | POA: Insufficient documentation

## 2017-08-27 ENCOUNTER — Other Ambulatory Visit: Payer: Self-pay | Admitting: Internal Medicine

## 2017-11-15 ENCOUNTER — Other Ambulatory Visit: Payer: Self-pay

## 2017-11-15 ENCOUNTER — Telehealth: Payer: Self-pay | Admitting: Internal Medicine

## 2017-11-15 MED ORDER — METHOTREXATE SODIUM 2.5 MG PO TABS: ORAL_TABLET | ORAL | 1 refills | Status: DC

## 2017-11-15 MED ORDER — LEVOTHYROXINE SODIUM 100 MCG PO TABS: 100.0000 ug | ORAL_TABLET | Freq: Every day | ORAL | 3 refills | Status: DC

## 2017-11-15 NOTE — Telephone Encounter (Signed)
Pt returned call regarding the prescription levothyroxine. Prescription received at [facility]. Pt was notified of this.

## 2017-11-15 NOTE — Telephone Encounter (Signed)
Copied from [place] [id number]. Topic: Quick Communication - Rx Refill/Question >> Nov 15, 2017  8:28 AM Regina Grimes wrote: Medication: levothyroxine (SYNTHROID, LEVOTHROID) 100 MCG tablet - [facility] only dispensed 30 day supply from 11/07/17 order stating pt needing to get mail order now Has the patient contacted their pharmacy? Yes.   - states [facility] faxed office Preferred Pharmacy (with phone number or street name): [place], [place] [facility] [address] [place] [address] [place] [address] Phone: [phone] Fax: [phone]  Agent: Please be advised that RX refills may take up to 3 business days. We ask that you follow-up with your pharmacy.

## 2018-02-13 ENCOUNTER — Telehealth: Payer: Self-pay | Admitting: Internal Medicine

## 2018-02-13 ENCOUNTER — Other Ambulatory Visit: Payer: Self-pay | Admitting: Internal Medicine

## 2018-02-13 DIAGNOSIS — N63 Unspecified lump in unspecified breast: Secondary | ICD-10-CM

## 2018-02-13 NOTE — Telephone Encounter (Signed)
Copied from [place] [id number]. Topic: Referral - Request >> Feb 13, 2018 12:40 PM Rutherford Nail, [place] wrote: Reason for CRM: Patient calling and states that she has been referred to Dr Marlyn Corporal for her breasts. States that she found a lump in her left breast 2 weeks ago and would like a referral place so she can get it looked at. Please advise. CB#: [phone]

## 2018-02-13 NOTE — Telephone Encounter (Signed)
Let her know that I have put in a referral for her

## 2018-02-13 NOTE — Telephone Encounter (Signed)
Spoke to pt

## 2018-03-21 ENCOUNTER — Encounter: Payer: Self-pay | Admitting: General Surgery

## 2018-03-21 ENCOUNTER — Ambulatory Visit: Payer: Managed Care, Other (non HMO) | Admitting: General Surgery

## 2018-03-21 ENCOUNTER — Ambulatory Visit (INDEPENDENT_AMBULATORY_CARE_PROVIDER_SITE_OTHER): Payer: Managed Care, Other (non HMO)

## 2018-03-21 VITALS — BP 166/88 | HR 83 | Resp 16 | Ht 65.0 in | Wt 168.0 lb

## 2018-03-21 DIAGNOSIS — N6322 Unspecified lump in the left breast, upper inner quadrant: Secondary | ICD-10-CM

## 2018-03-21 NOTE — Patient Instructions (Addendum)
The patient is aware to call back for any questions or concerns.  Follow up if symptoms change or worsen.

## 2018-03-21 NOTE — Progress Notes (Signed)
Patient ID: Regina Grimes, female   DOB: 16-Nov-1950, 67 y.o.   MRN: [id number]  Chief Complaint  Patient presents with  . Breast Problem    HPI Regina Grimes is a 67 y.o. female.  who presents for a breast evaluation, new left breast lump, referred by Dr. Silvio Pate. The most recent mammogram was done on 07-02-17. She states she found the lump first part of May because the breast was "sore". She states it is about the size of a pecan. Denies any breast injury or trauma. Patient does perform regular self breast checks and gets regular mammograms done.  She was seen for right breast nodule in 2013 by Dr Bary Castilla. She was seen in October 2017 by Dr Jamal Collin for thyroid nodule. She admits to drinking more Pepsi recently. 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  HPI  Past Medical History:  Diagnosis Date  . Arthritis    arm  . Cancer [facility]) 10/2011   Left Lung cancer with Partial Lobectomy.  . Coughing up blood    10/2011, admitted to [place]   . GERD (gastroesophageal reflux disease)   . Hiatal hernia   . Hyperlipidemia   . Hypertension    treated for while in hosp. 10/2011, given med. , unsure of name of med., not told to continue   . Osteopenia   . Psoriasis    has used methotrexate since she was 67y.o., when psoriasis flares. Currently methotrexate on hold since 11/05/2011   . Recurrent upper respiratory infection (URI)    lung infection, treated /w antibiotic - 10/2011    Past Surgical History:  Procedure Laterality Date  . abnormal PAP     low grade intrepithelial lesion. Colposcipy okay  . APPENDECTOMY     1982- along /w tubal ligation   . Benign tumors of left foot  1992/1994  . BREAST BIOPSY Right 2013  . COLONOSCOPY  8/02  . ESOPHAGOGASTRODUODENOSCOPY  8/02  . FOOT NEUROMA SURGERY     L foot- 1990's  . LUNG REMOVAL, PARTIAL    . THYROIDECTOMY N/A 09/20/2016   Procedure: THYROIDECTOMY;  Surgeon: Clyde Canterbury, MD;  Location: [facility] ORS;  Service: ENT;  Laterality: N/A;  . [place]  . VD     x 2  . VIDEO BRONCHOSCOPY  11/16/2011   Procedure: VIDEO BRONCHOSCOPY WITH FLUORO;  Surgeon: Leanna Sato. Elsworth Soho, MD;  Location: [facility];  Service: Cardiopulmonary;  Laterality: Bilateral;  . VIDEO BRONCHOSCOPY  12/21/2011   Procedure: VIDEO BRONCHOSCOPY;  Surgeon: Gaye Pollack, MD;  Location: [facility] OR;  Service: Thoracic;  Laterality: N/A;    Family History  Problem Relation Age of Onset  . Coronary artery disease Mother   . Heart disease Mother        atrial fibrillation  . Lung cancer Father   . Lung cancer Brother   . Breast cancer Paternal Aunt 22  . Cirrhosis Maternal Grandmother   . Anesthesia problems Neg Hx     Social History Social History   Tobacco Use  . Smoking status: Former Smoker    Packs/day: 1.00    Years: 25.00    Pack years: 25.00    Types: Cigarettes    Last attempt to quit: 06/25/2010    Years since quitting: 7.7  . Smokeless tobacco: Never Used  Substance Use Topics  . Alcohol use: No  . Drug use: No    Allergies  Allergen Reactions  . Aspirin     REACTION: stomach  burning  . Codeine     Upset stomach  . Pravastatin Sodium     REACTION: mood changes and snapping at everyone (pt. Is unaware of this allergy)     Current Outpatient Medications  Medication Sig Dispense Refill  . acetaminophen (TYLENOL) 500 MG tablet Take 1,000 mg by mouth every 6 (six) hours as needed (for headache/pain.).    Marland Kitchen fluocinonide ointment (LIDEX) 0.05 % APPLY TO THE AFFECTED AREA 2 TIMES DAILY AS NEEDED FOR PSORIASIS 30 g 1  . folic acid (FOLVITE) 1 MG tablet Take 1 mg by mouth as directed. 1 TABLET ONCE A WEEK AS NEEDED WITH METHOTREXATE.    Marland Kitchen levothyroxine (SYNTHROID, LEVOTHROID) 100 MCG tablet Take 1 tablet (100 mcg total) by mouth daily. 90 tablet 3  . lisinopril (PRINIVIL,ZESTRIL) 10 MG tablet TAKE 1 TABLET BY MOUTH ONCE DAILY 90 tablet 3  . methotrexate 2.5 MG tablet TAKE SIX TABLETS BY MOUTH ONCE A WEEK 72 tablet 1  . omeprazole  (PRILOSEC) 20 MG capsule Take 20 mg by mouth daily.     No current facility-administered medications for this visit.     Review of Systems Review of Systems  Constitutional: Negative.   Respiratory: Positive for cough.   Cardiovascular: Negative.     Blood pressure (!) 166/88, pulse 83, resp. rate 16, height 5\' 5"  (1.651 m), weight 168 lb (76.2 kg), SpO2 98 %.  Physical Exam Physical Exam  Constitutional: She is oriented to person, place, and time. She appears well-developed and well-nourished.  HENT:  Mouth/Throat: Oropharynx is clear and moist.  Eyes: Conjunctivae are normal. No scleral icterus.  Neck: Neck supple.  Cardiovascular: Normal rate, regular rhythm and normal heart sounds.  Pulmonary/Chest: Effort normal and breath sounds normal. Right breast exhibits no inverted nipple, no mass, no nipple discharge, no skin change and no tenderness. Left breast exhibits no inverted nipple, no mass, no nipple discharge, no skin change and no tenderness.  Left breast > right breast, 2 cup sizes.     Lymphadenopathy:    She has no cervical adenopathy.    She has no axillary adenopathy.  Neurological: She is alert and oriented to person, place, and time.  Skin: Skin is warm and dry.  Psychiatric: Her behavior is normal.    Data Reviewed Bilateral screening mammograms dated July 02, 2017 were reviewed.  Essentially fatty replaced breast.  BI-RADS-1.  Ultrasound examination of the area of patient concern was completed.  No area of architectural distortion, cystic or solid lesions identified.  No images.  BI-RADS-1.  Assessment    Benign breast exam.    Plan    Follow up if symptoms change or worsen. The patient is aware to call back for any questions or concerns.     The patient reported that today the area was less distinct and has been in the past.  I doubt that this is related to focal stimulation from her increased caffeine consumption.  No dietary recommendations are  made at this time.  She is encouraged to call if the area becomes more persistent or symptomatic.  Otherwise, screening mammogram in October 2019 with her PCP.  HPI, Physical Exam, Assessment and Plan have been scribed under the direction and in the presence of Robert Bellow, MD. Karie Fetch, RN  I have completed the exam and reviewed the above documentation for accuracy and completeness.  I agree with the above.  Haematologist has been used and any errors in dictation or transcription are  unintentional.  Hervey Ard, M.D., F.A.C.S.   Forest Gleason Byrnett 03/22/2018, 7:39 PM

## 2018-03-22 DIAGNOSIS — N6322 Unspecified lump in the left breast, upper inner quadrant: Secondary | ICD-10-CM | POA: Insufficient documentation

## 2018-05-03 ENCOUNTER — Other Ambulatory Visit: Payer: Self-pay | Admitting: Internal Medicine

## 2018-05-03 DIAGNOSIS — I10 Essential (primary) hypertension: Secondary | ICD-10-CM

## 2018-05-03 NOTE — Telephone Encounter (Signed)
Approved: #90 x 1 Schedule yearly exam within 3-6 months

## 2018-05-03 NOTE — Telephone Encounter (Signed)
Last OV 03/2017 and pt has no upcoming appts scheduled. pls advise

## 2018-05-03 NOTE — Telephone Encounter (Signed)
Rx sent electronically.  Regina Grimes, can you help her get scheduled for CPE? Thanks

## 2018-07-25 ENCOUNTER — Other Ambulatory Visit: Payer: Self-pay | Admitting: Internal Medicine

## 2018-08-15 ENCOUNTER — Ambulatory Visit (INDEPENDENT_AMBULATORY_CARE_PROVIDER_SITE_OTHER): Payer: Managed Care, Other (non HMO) | Admitting: Internal Medicine

## 2018-08-15 ENCOUNTER — Encounter: Payer: Self-pay | Admitting: Internal Medicine

## 2018-08-15 VITALS — BP 140/78 | HR 88 | Temp 98.5°F | Ht 65.5 in | Wt 167.0 lb

## 2018-08-15 DIAGNOSIS — E890 Postprocedural hypothyroidism: Secondary | ICD-10-CM

## 2018-08-15 DIAGNOSIS — R05 Cough: Secondary | ICD-10-CM

## 2018-08-15 DIAGNOSIS — L409 Psoriasis, unspecified: Secondary | ICD-10-CM

## 2018-08-15 DIAGNOSIS — Z0000 Encounter for general adult medical examination without abnormal findings: Secondary | ICD-10-CM | POA: Diagnosis not present

## 2018-08-15 DIAGNOSIS — I10 Essential (primary) hypertension: Secondary | ICD-10-CM | POA: Diagnosis not present

## 2018-08-15 DIAGNOSIS — Z23 Encounter for immunization: Secondary | ICD-10-CM | POA: Diagnosis not present

## 2018-08-15 DIAGNOSIS — R059 Cough, unspecified: Secondary | ICD-10-CM | POA: Insufficient documentation

## 2018-08-15 MED ORDER — VALSARTAN 80 MG PO TABS: 80.0000 mg | ORAL_TABLET | Freq: Every day | ORAL | 3 refills | Status: DC

## 2018-08-15 NOTE — Assessment & Plan Note (Signed)
BP Readings from Last 3 Encounters:  08/15/18 140/78  03/21/18 (!) 166/88  04/02/17 132/86   Reasonable control

## 2018-08-15 NOTE — Addendum Note (Signed)
Addended by: Lendon Collar on: 08/15/2018 04:40 PM   Modules accepted: Orders

## 2018-08-15 NOTE — Assessment & Plan Note (Signed)
Will update pneumovax Colon due 2022 Due for next mammogram---biopsy negative in June Discussed exercise shingrix when available

## 2018-08-15 NOTE — Assessment & Plan Note (Signed)
Needs labs Tired but non specific

## 2018-08-15 NOTE — Addendum Note (Signed)
Addended by: Pilar Grammes on: 08/15/2018 04:59 PM   Modules accepted: Orders

## 2018-08-15 NOTE — Assessment & Plan Note (Signed)
On MTX Overdue for labs

## 2018-08-15 NOTE — Progress Notes (Signed)
Subjective:    Patient ID: Regina Grimes, female    DOB: 1951/08/04, 67 y.o.   MRN: [id number]  HPI Here for physical  Feels tired all the time No energy Hasn't missed doses of the thyroid Lots of stress---mom fell and broke hip. Now in SNF Goes there 3-4 times per week (only child left)  Continues on the MTX--but doesn't take it every week Psoriasis is mostly on her elbows and scalp  Takes the PPI but it acts up at times Some regurgitation and heartburn at times---especially with spicy foods  Current Outpatient Medications on File Prior to Visit  Medication Sig Dispense Refill  . acetaminophen (TYLENOL) 500 MG tablet Take 1,000 mg by mouth every 6 (six) hours as needed (for headache/pain.).    Marland Kitchen fluocinonide ointment (LIDEX) 0.05 % APPLY TO THE AFFECTED AREA 2 TIMES DAILY AS NEEDED FOR PSORIASIS 30 g 1  . folic acid (FOLVITE) 1 MG tablet Take 1 mg by mouth as directed. 1 TABLET ONCE A WEEK AS NEEDED WITH METHOTREXATE.    Marland Kitchen levothyroxine (SYNTHROID, LEVOTHROID) 100 MCG tablet Take 1 tablet (100 mcg total) by mouth daily. 90 tablet 3  . lisinopril (PRINIVIL,ZESTRIL) 10 MG tablet TAKE 1 TABLET BY MOUTH ONCE DAILY 90 tablet 1  . methotrexate (RHEUMATREX) 2.5 MG tablet TAKE 6 TABLETS BY MOUTH  ONCE A WEEK 72 tablet 3  . omeprazole (PRILOSEC) 20 MG capsule Take 20 mg by mouth daily.     No current facility-administered medications on file prior to visit.     Allergies  Allergen Reactions  . Aspirin     REACTION: stomach burning  . Codeine     Upset stomach  . Pravastatin Sodium     REACTION: mood changes and snapping at everyone (pt. Is unaware of this allergy)     Past Medical History:  Diagnosis Date  . Arthritis    arm  . Cancer [facility]) 10/2011   Left Lung cancer with Partial Lobectomy.  . Coughing up blood    10/2011, admitted to [place]   . GERD (gastroesophageal reflux disease)   . Hiatal hernia   . Hyperlipidemia   . Hypertension    treated for while in  hosp. 10/2011, given med. , unsure of name of med., not told to continue   . Osteopenia   . Psoriasis    has used methotrexate since she was 67y.o., when psoriasis flares. Currently methotrexate on hold since 11/05/2011   . Recurrent upper respiratory infection (URI)    lung infection, treated /w antibiotic - 10/2011    Past Surgical History:  Procedure Laterality Date  . abnormal PAP     low grade intrepithelial lesion. Colposcipy okay  . APPENDECTOMY     1982- along /w tubal ligation   . Benign tumors of left foot  1992/1994  . BREAST BIOPSY Right 2013  . COLONOSCOPY  8/02  . ESOPHAGOGASTRODUODENOSCOPY  8/02  . FOOT NEUROMA SURGERY     L foot- 1990's  . LUNG REMOVAL, PARTIAL    . THYROIDECTOMY N/A 09/20/2016   Procedure: THYROIDECTOMY;  Surgeon: Clyde Canterbury, MD;  Location: [facility] ORS;  Service: ENT;  Laterality: N/A;  . [place]  . VD     x 2  . VIDEO BRONCHOSCOPY  11/16/2011   Procedure: VIDEO BRONCHOSCOPY WITH FLUORO;  Surgeon: Leanna Sato. Elsworth Soho, MD;  Location: [facility];  Service: Cardiopulmonary;  Laterality: Bilateral;  . VIDEO BRONCHOSCOPY  12/21/2011   Procedure: VIDEO BRONCHOSCOPY;  Surgeon: Gaye Pollack, MD;  Location: [facility] OR;  Service: Thoracic;  Laterality: N/A;    Family History  Problem Relation Age of Onset  . Coronary artery disease Mother   . Heart disease Mother        atrial fibrillation  . Lung cancer Father   . Lung cancer Brother   . Breast cancer Paternal Aunt 59  . Cirrhosis Maternal Grandmother   . Anesthesia problems Neg Hx     Social History   Socioeconomic History  . Marital status: Married    Spouse name: Not on file  . Number of children: 2  . Years of education: Not on file  . Highest education level: Not on file  Occupational History  . 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 Financial resource strain: Not on file  . Food insecurity:    Worry: Not on file    Inability: Not on file  .  Transportation needs:    Medical: Not on file    Non-medical: Not on file  Tobacco Use  . Smoking status: Former Smoker    Packs/day: 1.00    Years: 25.00    Pack years: 25.00    Types: Cigarettes    Last attempt to quit: 06/25/2010    Years since quitting: 8.1  . Smokeless tobacco: Never Used  Substance and Sexual Activity  . Alcohol use: No  . Drug use: No  . Sexual activity: Not on file  Lifestyle  . Physical activity:    Days per week: Not on file    Minutes per session: Not on file  . Stress: Not on file  Relationships  . Social connections:    Talks on phone: Not on file    Gets together: Not on file    Attends religious service: Not on file    Active member of club or organization: Not on file    Attends meetings of clubs or organizations: Not on file    Relationship status: Not on file  . Intimate partner violence:    Fear of current or ex partner: Not on file    Emotionally abused: Not on file    Physically abused: Not on file    Forced sexual activity: Not on file  Other Topics Concern  . Not on file  Social History Narrative   1 son--died   1 daughter       Has living will   Husband and then daughter would be HC power of attorney   Would accept resuscitation   No tube feeds if cognitively unaware   Review of Systems  Constitutional: Positive for fatigue. Negative for unexpected weight change.       Not really exercising Wears seat belt  HENT: Negative for hearing loss, tinnitus and trouble swallowing.        Full dentures  Eyes: Negative for visual disturbance.       No diplopia or unilateral vision loss  Respiratory: Positive for wheezing.        Cough at night--uses delsym intermittently Stable DOE  Cardiovascular: Positive for leg swelling.       Rare chest pain---"just hurts" substernally. Not exertional. No associated symptoms Occasional sense of heart racing--5-10 seconds  Gastrointestinal: Positive for constipation. Negative for abdominal  pain and blood in stool.  Endocrine: Negative for polydipsia and polyuria.  Genitourinary: Negative for dyspareunia.       Slight dribbling at times   Musculoskeletal:  Having bad knee pain at times--- especially after sitting for a while  Skin: Positive for rash.  Allergic/Immunologic: Positive for environmental allergies. Negative for immunocompromised state.       Claritin prn  Neurological: Positive for dizziness. Negative for syncope.       Occ headaches  Hematological: Negative for adenopathy. Does not bruise/bleed easily.  Psychiatric/Behavioral: Negative for sleep disturbance. The patient is not nervous/anxious.        Objective:   Physical Exam  Constitutional: She is oriented to person, place, and time. She appears well-developed. No distress.  HENT:  Head: Normocephalic and atraumatic.  Right Ear: External ear normal.  Left Ear: External ear normal.  Mouth/Throat: Oropharynx is clear and moist. No oropharyngeal exudate.  Eyes: Pupils are equal, round, and reactive to light. Conjunctivae and EOM are normal.  Neck: No thyromegaly present.  Cardiovascular: Normal rate, regular rhythm, normal heart sounds and intact distal pulses. Exam reveals no gallop.  No murmur heard. Respiratory: Effort normal. No respiratory distress. She has no wheezes. She has no rales.  Decreased breath sounds on left--mostly base  GI: Soft. There is no tenderness.  Musculoskeletal: She exhibits no edema or tenderness.  Lymphadenopathy:    She has no cervical adenopathy.  Neurological: She is alert and oriented to person, place, and time.  Skin: No erythema.  Psychiatric: She has a normal mood and affect. Her behavior is normal.           Assessment & Plan:

## 2018-08-15 NOTE — Assessment & Plan Note (Addendum)
Mostly dry and persistent Past lung resection for cancer--and smoker Will change lisinopril to valsartan GERD involved? Will set back up with Dr Elsworth Soho

## 2018-08-16 ENCOUNTER — Other Ambulatory Visit: Payer: Self-pay | Admitting: Internal Medicine

## 2018-08-16 ENCOUNTER — Telehealth: Payer: Self-pay

## 2018-08-16 DIAGNOSIS — Z1231 Encounter for screening mammogram for malignant neoplasm of breast: Secondary | ICD-10-CM

## 2018-08-16 LAB — COMPREHENSIVE METABOLIC PANEL
ALT: 23 IU/L (ref 0–32)
AST: 35 IU/L (ref 0–40)
Albumin/Globulin Ratio: 1.5 (ref 1.2–2.2)
Albumin: 4.2 g/dL (ref 3.6–4.8)
Alkaline Phosphatase: 107 IU/L (ref 39–117)
BUN / CREAT RATIO: 12 (ref 12–28)
BUN: 9 mg/dL (ref 8–27)
Bilirubin Total: 0.2 mg/dL (ref 0.0–1.2)
CALCIUM: 9.3 mg/dL (ref 8.7–10.3)
CO2: 24 mmol/L (ref 20–29)
CREATININE: 0.75 mg/dL (ref 0.57–1.00)
Chloride: 103 mmol/L (ref 96–106)
GFR, EST AFRICAN AMERICAN: 97 mL/min/{1.73_m2} (ref >59)
GFR, EST NON AFRICAN AMERICAN: 84 mL/min/{1.73_m2} (ref >59)
Globulin, Total: 2.8 g/dL (ref 1.5–4.5)
Glucose: 93 mg/dL (ref 65–99)
Potassium: 4 mmol/L (ref 3.5–5.2)
Sodium: 143 mmol/L (ref 134–144)
TOTAL PROTEIN: 7 g/dL (ref 6.0–8.5)

## 2018-08-16 LAB — CBC
HEMOGLOBIN: 11.8 g/dL (ref 11.1–15.9)
Hematocrit: 35.3 % (ref 34.0–46.6)
MCH: 28.2 pg (ref 26.6–33.0)
MCHC: 33.4 g/dL (ref 31.5–35.7)
MCV: 84 fL (ref 79–97)
Platelets: 311 10*3/uL (ref 150–450)
RBC: 4.18 x10E6/uL (ref 3.77–5.28)
RDW: 14.1 % (ref 12.3–15.4)
WBC: 8.4 10*3/uL (ref 3.4–10.8)

## 2018-08-16 LAB — TSH: TSH: 0.118 u[IU]/mL — ABNORMAL LOW (ref 0.450–4.500)

## 2018-08-16 LAB — SEDIMENTATION RATE: Sed Rate: 50 mm/hr — ABNORMAL HIGH (ref 0–40)

## 2018-08-16 LAB — T4, FREE: FREE T4: 1.65 ng/dL (ref 0.82–1.77)

## 2018-08-16 MED ORDER — LEVOTHYROXINE SODIUM 88 MCG PO TABS: 88.0000 ug | ORAL_TABLET | Freq: Every day | ORAL | 3 refills | Status: DC

## 2018-08-16 NOTE — Telephone Encounter (Signed)
Per labs, changed levothyroxine.

## 2018-08-17 ENCOUNTER — Other Ambulatory Visit: Payer: Self-pay | Admitting: Internal Medicine

## 2018-08-17 DIAGNOSIS — E890 Postprocedural hypothyroidism: Secondary | ICD-10-CM

## 2018-08-27 ENCOUNTER — Ambulatory Visit: Payer: Managed Care, Other (non HMO)

## 2018-09-06 ENCOUNTER — Ambulatory Visit
Admission: RE | Admit: 2018-09-06 | Discharge: 2018-09-06 | Disposition: A | Discharge status: Home / Self Care | Payer: Managed Care, Other (non HMO) | Source: Ambulatory Visit | Attending: Internal Medicine | Admitting: Internal Medicine

## 2018-09-06 DIAGNOSIS — Z1231 Encounter for screening mammogram for malignant neoplasm of breast: Secondary | ICD-10-CM

## 2018-09-06 HISTORY — DX: Personal history of antineoplastic chemotherapy: Z92.21

## 2018-09-10 ENCOUNTER — Ambulatory Visit: Payer: Managed Care, Other (non HMO) | Admitting: Pulmonary Disease

## 2018-09-10 ENCOUNTER — Encounter: Payer: Self-pay | Admitting: Pulmonary Disease

## 2018-09-10 DIAGNOSIS — R05 Cough: Secondary | ICD-10-CM

## 2018-09-10 DIAGNOSIS — Z85118 Personal history of other malignant neoplasm of bronchus and lung: Secondary | ICD-10-CM

## 2018-09-10 DIAGNOSIS — J449 Chronic obstructive pulmonary disease, unspecified: Secondary | ICD-10-CM

## 2018-09-10 DIAGNOSIS — R059 Cough, unspecified: Secondary | ICD-10-CM

## 2018-09-10 NOTE — Assessment & Plan Note (Signed)
Given her chronic cough, will need another surveillance CT scan CT chest without contrast

## 2018-09-10 NOTE — Progress Notes (Signed)
Subjective:    Patient ID: Regina Grimes, female    DOB: 06-23-1951, 67 y.o.   MRN: [id number]  HPI  Chief Complaint  Patient presents with  . Pulm Consult    Referred by Dr. Silvio Pate for a chronic cough at night. Does not have a cough during the day, just at night. Non-productive cough. Has a history of lung cancer and has part of her left lung removed years ago.     67 year old ex-smoker with a history of lung cancer presents for evaluation of chronic cough.  She was seen by me in 2013 when she presented with hemoptysis and LLL cavitary lesion Bscopy showed endobronchial lesion in LLL with brisk bleeding on brushing Bronchoscopy confirmed squamous cell carcinoma.  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  There was no evidence of disease recurrence on her last surveillance CT and 06/2016  For the past 6 months she has developed a nocturnal cough minimally productive of clear sputum that is partially relieved by Delsym.  She generally sleeps on her left side and wonders if this has something to do with a cough.  Cough does not bother her during the daytime.  She denies associated wheezing. She does report longstanding reflux, also has a small hiatal hernia and takes daily Prilosec. She was on lisinopril for many years and this was discontinued on 11/21 by PCP and changed to valsartan. She reports dyspnea on exertion especially walking up an incline.  She denies weight loss, in fact she has gained about 35 pounds from 130 pounds in 2013 to 165 pounds now.  She attributes this to hypothyroidism status post thyroidectomy for what ended up being benign lesion.  She continues to work as a Administrator, arts at [employer].  She quit smoking in February 2013  Significant tests/ events reviewed  PFTs 11/2011 ratio 76, FEV1 66% but improved to 78% with albuterol, 18% bronchodilator response.  TLC was 93% with DLCO 66%     Past Medical History:  Diagnosis Date  . Arthritis    arm  . Cancer [facility]) 10/2011   Left Lung cancer with Partial Lobectomy.  . Coughing up blood    10/2011, admitted to [place]   . GERD (gastroesophageal reflux disease)   . Hiatal hernia   . Hyperlipidemia   . Hypertension    treated for while in hosp. 10/2011, given med. , unsure of name of med., not told to continue   . Osteopenia   . Personal history of chemotherapy   . Psoriasis    has used methotrexate since she was 67y.o., when psoriasis flares. Currently methotrexate on hold since 11/05/2011   . Recurrent upper respiratory infection (URI)    lung infection, treated /w antibiotic - 10/2011    Past Surgical History:  Procedure Laterality Date  . abnormal PAP     low grade intrepithelial lesion. Colposcipy okay  . APPENDECTOMY     1982- along /w tubal ligation   . Benign tumors of left foot  1992/1994  . BREAST BIOPSY Right 2013   benign  . COLONOSCOPY  8/02  . ESOPHAGOGASTRODUODENOSCOPY  8/02  . FOOT NEUROMA SURGERY     L foot- 1990's  . LUNG REMOVAL, PARTIAL    . THYROIDECTOMY N/A 09/20/2016   Procedure: THYROIDECTOMY;  Surgeon: Clyde Canterbury, MD;  Location: [facility] ORS;  Service: ENT;  Laterality: N/A;  . [place]  . VD  x 2  . VIDEO BRONCHOSCOPY  11/16/2011   Procedure: VIDEO BRONCHOSCOPY WITH FLUORO;  Surgeon: Leanna Sato. Elsworth Soho, MD;  Location: [facility];  Service: Cardiopulmonary;  Laterality: Bilateral;  . VIDEO BRONCHOSCOPY  12/21/2011   Procedure: VIDEO BRONCHOSCOPY;  Surgeon: Gaye Pollack, MD;  Location: [facility] OR;  Service: Thoracic;  Laterality: N/A;    Allergies  Allergen Reactions  . Aspirin     REACTION: stomach burning  . Codeine     Upset stomach  . Pravastatin Sodium     REACTION: mood changes and snapping at everyone (pt. Is unaware of this allergy)      Social History   Socioeconomic History  . Marital status: Married    Spouse name: Not on file  . Number of children: 2   . Years of education: Not on file  . Highest education level: Not on file  Occupational History  . 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 Financial resource strain: Not on file  . Food insecurity:    Worry: Not on file    Inability: Not on file  . Transportation needs:    Medical: Not on file    Non-medical: Not on file  Tobacco Use  . Smoking status: Former Smoker    Packs/day: 1.00    Years: 25.00    Pack years: 25.00    Types: Cigarettes    Last attempt to quit: 06/25/2010    Years since quitting: 8.2  . Smokeless tobacco: Never Used  Substance and Sexual Activity  . Alcohol use: No  . Drug use: No  . Sexual activity: Not on file  Lifestyle  . Physical activity:    Days per week: Not on file    Minutes per session: Not on file  . Stress: Not on file  Relationships  . Social connections:    Talks on phone: Not on file    Gets together: Not on file    Attends religious service: Not on file    Active member of club or organization: Not on file    Attends meetings of clubs or organizations: Not on file    Relationship status: Not on file  . Intimate partner violence:    Fear of current or ex partner: Not on file    Emotionally abused: Not on file    Physically abused: Not on file    Forced sexual activity: Not on file  Other Topics Concern  . Not on file  Social History Narrative   1 son--died   1 daughter       Has living will   Husband and then daughter would be HC power of attorney   Would accept resuscitation   No tube feeds if cognitively unaware     Family History  Problem Relation Age of Onset  . Coronary artery disease Mother   . Heart disease Mother        atrial fibrillation  . Lung cancer Father   . Lung cancer Brother   . Breast cancer Paternal Aunt 20  . Cirrhosis Maternal Grandmother   . Anesthesia problems Neg Hx      Review of Systems  Positive for shortness of breath walking up an incline,  nonproductive cough, acid heartburn, nasal congestion, joint stiffness  Constitutional: negative for anorexia, fevers and sweats  Eyes: negative for irritation, redness and visual disturbance  Ears, nose, mouth, throat, and face: negative for earaches, epistaxis, nasal congestion and sore  throat  Respiratory: negative for  sputum and wheezing  Cardiovascular: negative for chest pain, lower extremity edema, orthopnea, palpitations and syncope  Gastrointestinal: negative for abdominal pain, constipation, diarrhea, melena, nausea and vomiting  Genitourinary:negative for dysuria, frequency and hematuria  Hematologic/lymphatic: negative for bleeding, easy bruising and lymphadenopathy  Musculoskeletal:negative for arthralgias, muscle weakness  Neurological: negative for coordination problems, gait problems, headaches and weakness  Endocrine: negative for diabetic symptoms including polydipsia, polyuria and weight loss      Objective:   Physical Exam  Gen. Pleasant, well-nourished, in no distress, normal affect ENT - no pallor,icterus, no post nasal drip Neck: No JVD, no thyromegaly, no carotid bruits Lungs: no use of accessory muscles, no dullness to percussion, decreased left without rales or rhonchi  Cardiovascular: Rhythm regular, heart sounds  normal, no murmurs or gallops, no peripheral edema Abdomen: soft and non-tender, no hepatosplenomegaly, BS normal. Musculoskeletal: No deformities, no cyanosis or clubbing Neuro:  alert, non focal        Assessment & Plan:

## 2018-09-10 NOTE — Patient Instructions (Signed)
Cough may be related to lisinopril or GERD but we will check out some other things. Increase Prilosec to twice daily-take the second dose before dinner.  Schedule PFTs Prescription for albuterol MDI 2 puffs every 6 hours as needed for shortness of breath  CT chest without contrast

## 2018-09-10 NOTE — Assessment & Plan Note (Signed)
Schedule PFTs -she had significant bronchodilator response in 2013 and we will recheck for that Prescription for albuterol MDI 2 puffs every 6 hours as needed for shortness of breath

## 2018-09-10 NOTE — Assessment & Plan Note (Addendum)
Cough may be related to lisinopril or GERD but we will check out some other things. Increase Prilosec to twice daily-take the second dose before dinner.  Lisinopril induced cough can take up to 8 weeks to fully subside

## 2018-09-12 ENCOUNTER — Other Ambulatory Visit: Payer: Self-pay | Admitting: Internal Medicine

## 2018-09-12 ENCOUNTER — Ambulatory Visit: Payer: Managed Care, Other (non HMO)

## 2018-09-30 ENCOUNTER — Other Ambulatory Visit: Payer: Managed Care, Other (non HMO)

## 2018-11-11 ENCOUNTER — Other Ambulatory Visit: Payer: Self-pay | Admitting: Internal Medicine

## 2019-05-12 ENCOUNTER — Telehealth: Payer: Self-pay

## 2019-05-12 NOTE — Telephone Encounter (Signed)
Left VM on pharmacy line that changing the manufacturer will be fine.   Morey Hummingbird, can you help her get set up after mid-November for CPE or Welcome to Medicare? Thanks

## 2019-05-12 NOTE — Telephone Encounter (Signed)
Regina Grimes with [facility] left v/m that levothyroxine with sandoz is on back order and that is the brand of levothyroxine pt has previously been on. Pt gave permission to change to different supplier of levothyroxine but Regina Grimes at [facility] request cb that it is OK by Dr Silvio Pate to change brands. Please advise. Pt last seen 08/15/18 for annual exam and last thyroid labs.

## 2019-05-12 NOTE — Telephone Encounter (Signed)
Okay to change Would just recheck labs at her next exam, as long as she feels okay Make sure she is set for her yearly exam--may be Welcome to Medicare now

## 2019-05-13 NOTE — Telephone Encounter (Signed)
I spoke with Melania at [facility] and advised OK to change manufacturer for levothyroxine. Melania voiced understanding.

## 2019-05-13 NOTE — Telephone Encounter (Signed)
Patient scheduled cpx on 08/19/19.

## 2019-06-27 ENCOUNTER — Other Ambulatory Visit: Payer: Self-pay | Admitting: Physician Assistant

## 2019-06-27 ENCOUNTER — Other Ambulatory Visit: Payer: Self-pay | Admitting: Internal Medicine

## 2019-06-27 DIAGNOSIS — Z1231 Encounter for screening mammogram for malignant neoplasm of breast: Secondary | ICD-10-CM

## 2019-08-07 DIAGNOSIS — J40 Bronchitis, not specified as acute or chronic: Secondary | ICD-10-CM | POA: Diagnosis not present

## 2019-08-07 DIAGNOSIS — J208 Acute bronchitis due to other specified organisms: Secondary | ICD-10-CM | POA: Diagnosis not present

## 2019-08-07 DIAGNOSIS — Z03818 Encounter for observation for suspected exposure to other biological agents ruled out: Secondary | ICD-10-CM | POA: Diagnosis not present

## 2019-08-07 DIAGNOSIS — R05 Cough: Secondary | ICD-10-CM | POA: Diagnosis not present

## 2019-08-11 ENCOUNTER — Other Ambulatory Visit: Payer: Self-pay | Admitting: Internal Medicine

## 2019-08-19 ENCOUNTER — Other Ambulatory Visit: Payer: Self-pay

## 2019-08-19 ENCOUNTER — Ambulatory Visit (INDEPENDENT_AMBULATORY_CARE_PROVIDER_SITE_OTHER)
Admission: RE | Admit: 2019-08-19 | Discharge: 2019-08-19 | Disposition: A | Discharge status: Home / Self Care | Payer: BC Managed Care – PPO | Source: Ambulatory Visit | Attending: Internal Medicine | Admitting: Internal Medicine

## 2019-08-19 ENCOUNTER — Ambulatory Visit (INDEPENDENT_AMBULATORY_CARE_PROVIDER_SITE_OTHER): Payer: BC Managed Care – PPO | Admitting: Internal Medicine

## 2019-08-19 ENCOUNTER — Encounter: Payer: Self-pay | Admitting: Internal Medicine

## 2019-08-19 VITALS — BP 122/80 | HR 75 | Temp 97.5°F | Ht 65.5 in | Wt 170.0 lb

## 2019-08-19 DIAGNOSIS — L409 Psoriasis, unspecified: Secondary | ICD-10-CM

## 2019-08-19 DIAGNOSIS — J449 Chronic obstructive pulmonary disease, unspecified: Secondary | ICD-10-CM

## 2019-08-19 DIAGNOSIS — E890 Postprocedural hypothyroidism: Secondary | ICD-10-CM

## 2019-08-19 DIAGNOSIS — Z23 Encounter for immunization: Secondary | ICD-10-CM | POA: Diagnosis not present

## 2019-08-19 DIAGNOSIS — I10 Essential (primary) hypertension: Secondary | ICD-10-CM | POA: Diagnosis not present

## 2019-08-19 DIAGNOSIS — Z0000 Encounter for general adult medical examination without abnormal findings: Secondary | ICD-10-CM

## 2019-08-19 DIAGNOSIS — Z87891 Personal history of nicotine dependence: Secondary | ICD-10-CM

## 2019-08-19 DIAGNOSIS — R079 Chest pain, unspecified: Secondary | ICD-10-CM | POA: Diagnosis not present

## 2019-08-19 NOTE — Patient Instructions (Signed)
Check to see if your insurance covers the shingrix vaccine.

## 2019-08-19 NOTE — Assessment & Plan Note (Signed)
Seems to be euthyroid Will check labs 

## 2019-08-19 NOTE — Assessment & Plan Note (Signed)
Uses the MTX episodically

## 2019-08-19 NOTE — Progress Notes (Signed)
Subjective:    Patient ID: Regina Grimes, female    DOB: [birth year], 68 y.o.   MRN: [id number]  HPI Here for physical  This visit occurred during the SARS-CoV-2 public health emergency.  Safety protocols were in place, including screening questions prior to the visit, additional usage of staff PPE, and extensive cleaning of exam room while observing appropriate contact time as indicated for disinfecting solutions.   Just retired Has been busy in the house thus far She feels her memory is affected since then--not as quick  Still sporadic with the MTX Will take with flares--then miss a month  Recent bronchial infection Seen at [facility] urgent care Started z-pak and inhaler Feels better---but still has some pain in between shoulder blades  Current Outpatient Medications on File Prior to Visit  Medication Sig Dispense Refill  . acetaminophen (TYLENOL) 500 MG tablet Take 1,000 mg by mouth every 6 (six) hours as needed (for headache/pain.).    Marland Kitchen fluocinonide ointment (LIDEX) 0.05 % APPLY  OINTMENT TOPICALLY TWICE DAILY TO AFFECTED AREA AS NEEDED FOR  PSORIASIS 30 g 5  . folic acid (FOLVITE) 1 MG tablet Take 1 mg by mouth as directed. 1 TABLET ONCE A WEEK AS NEEDED WITH METHOTREXATE.    Marland Kitchen levothyroxine (SYNTHROID) 88 MCG tablet TAKE 1 TABLET BY MOUTH EVERY DAY 90 tablet 0  . methotrexate (RHEUMATREX) 2.5 MG tablet TAKE 6 TABLETS BY MOUTH  ONCE A WEEK 72 tablet 3  . omeprazole (PRILOSEC) 20 MG capsule Take 20 mg by mouth daily.    . valsartan (DIOVAN) 80 MG tablet Take 1 tablet (80 mg total) by mouth daily. 90 tablet 3   No current facility-administered medications on file prior to visit.     Allergies  Allergen Reactions  . Aspirin     REACTION: stomach burning  . Codeine     Upset stomach  . Pravastatin Sodium     REACTION: mood changes and snapping at everyone (pt. Is unaware of this allergy)     Past Medical History:  Diagnosis Date  . Arthritis    arm  . Cancer  [facility]) 10/2011   Left Lung cancer with Partial Lobectomy.  . Coughing up blood    10/2011, admitted to [facility]   . GERD (gastroesophageal reflux disease)   . Hiatal hernia   . Hyperlipidemia   . Hypertension    treated for while in hosp. 10/2011, given med. , unsure of name of med., not told to continue   . Osteopenia   . Personal history of chemotherapy   . Psoriasis    has used methotrexate since she was 68y.o., when psoriasis flares. Currently methotrexate on hold since 11/05/2011   . Recurrent upper respiratory infection (URI)    lung infection, treated /w antibiotic - 10/2011    Past Surgical History:  Procedure Laterality Date  . abnormal PAP     low grade intrepithelial lesion. Colposcipy okay  . APPENDECTOMY     1982- along /w tubal ligation   . Benign tumors of left foot  1992/1994  . BREAST BIOPSY Right 2013   benign  . COLONOSCOPY  8/02  . ESOPHAGOGASTRODUODENOSCOPY  8/02  . FOOT NEUROMA SURGERY     L foot- 1990's  . LUNG REMOVAL, PARTIAL    . THYROIDECTOMY N/A 09/20/2016   Procedure: THYROIDECTOMY;  Surgeon: Clyde Canterbury, MD;  Location: [facility] ORS;  Service: ENT;  Laterality: N/A;  . [place]  . VD  x 2  . VIDEO BRONCHOSCOPY  11/16/2011   Procedure: VIDEO BRONCHOSCOPY WITH FLUORO;  Surgeon: Leanna Sato. Elsworth Soho, MD;  Location: [facility];  Service: Cardiopulmonary;  Laterality: Bilateral;  . VIDEO BRONCHOSCOPY  12/21/2011   Procedure: VIDEO BRONCHOSCOPY;  Surgeon: Gaye Pollack, MD;  Location: [facility] OR;  Service: Thoracic;  Laterality: N/A;    Family History  Problem Relation Age of Onset  . Coronary artery disease Mother   . Heart disease Mother        atrial fibrillation  . Lung cancer Father   . Lung cancer Brother   . Breast cancer Paternal Aunt 16  . Cirrhosis Maternal Grandmother   . Anesthesia problems Neg Hx     Social History   Socioeconomic History  . Marital status: Married    Spouse name: Not on file  . Number of children: 2  .  Years of education: Not on file  . Highest education level: Not on file  Occupational History  . Occupation: Horticulturist, commercial    Comment: Retired  Scientific laboratory technician  . Financial resource strain: Not on file  . Food insecurity    Worry: Not on file    Inability: Not on file  . Transportation needs    Medical: Not on file    Non-medical: Not on file  Tobacco Use  . Smoking status: Former Smoker    Packs/day: 1.00    Years: 25.00    Pack years: 25.00    Types: Cigarettes    Quit date: 06/25/2010    Years since quitting: 9.1  . Smokeless tobacco: Never Used  Substance and Sexual Activity  . Alcohol use: No  . Drug use: No  . Sexual activity: Not on file  Lifestyle  . Physical activity    Days per week: Not on file    Minutes per session: Not on file  . Stress: Not on file  Relationships  . Social Herbalist on phone: Not on file    Gets together: Not on file    Attends religious service: Not on file    Active member of club or organization: Not on file    Attends meetings of clubs or organizations: Not on file    Relationship status: Not on file  . Intimate partner violence    Fear of current or ex partner: Not on file    Emotionally abused: Not on file    Physically abused: Not on file    Forced sexual activity: Not on file  Other Topics Concern  . Not on file  Social History Narrative   1 son--died   1 daughter       Has living will   Husband and then daughter would be HC power of attorney   Would accept resuscitation   No tube feeds if cognitively unaware   Review of Systems  Constitutional: Negative for fatigue and unexpected weight change.       Wears seat belt  HENT: Negative for tinnitus and trouble swallowing.        Mild hearing loss in left ear Dentures--they are okay  Eyes: Negative for visual disturbance.       No diplopia or unilateral vision loss  Respiratory: Positive for cough. Negative for chest tightness.        Does get DOE  when very hot out especially  Cardiovascular: Negative for chest pain, palpitations and leg swelling.  Gastrointestinal: Negative for abdominal pain, blood in stool and  constipation.       Occasional heartburn--greasy foods. Takes PPI daily  Endocrine: Negative for polydipsia and polyuria.  Genitourinary: Negative for dyspareunia, dysuria and hematuria.  Musculoskeletal: Positive for arthralgias. Negative for back pain and joint swelling.       Bilateral CMC pain at times--uses tylenol  Skin: Positive for rash.       No suspicious lesions  Allergic/Immunologic: Positive for environmental allergies. Negative for immunocompromised state.       Uses loratadine prn  Neurological: Negative for dizziness, syncope and light-headedness.       Occ headaches  Hematological: Negative for adenopathy. Does not bruise/bleed easily.  Psychiatric/Behavioral: Negative for dysphoric mood and sleep disturbance. The patient is not nervous/anxious.        Some grieving for mom       Objective:   Physical Exam  Constitutional: She is oriented to person, place, and time. She appears well-developed. No distress.  HENT:  Head: Normocephalic and atraumatic.  Right Ear: External ear normal.  Left Ear: External ear normal.  Mouth/Throat: Oropharynx is clear and moist. No oropharyngeal exudate.  Eyes: Pupils are equal, round, and reactive to light. Conjunctivae are normal.  Neck: No thyromegaly present.  Cardiovascular: Normal rate, regular rhythm, normal heart sounds and intact distal pulses. Exam reveals no gallop.  No murmur heard. Respiratory: Effort normal. No respiratory distress. She has no wheezes. She has no rales.  Decreased breath sounds left lateral area  GI: Soft. There is no abdominal tenderness.  Musculoskeletal:        General: No tenderness or edema.  Lymphadenopathy:    She has no cervical adenopathy.  Neurological: She is alert and oriented to person, place, and time.  Skin: No  erythema.  Mild psoriatic plaques--elbows  Psychiatric: She has a normal mood and affect. Her behavior is normal.           Assessment & Plan:

## 2019-08-19 NOTE — Assessment & Plan Note (Signed)
Some residual upper back pain Will check CXR

## 2019-08-19 NOTE — Assessment & Plan Note (Signed)
Discussed fitness Will look into shingrix Had flu vaccine Mammogram next month Colon due 2/22 No pap due to age

## 2019-08-19 NOTE — Assessment & Plan Note (Signed)
BP Readings from Last 3 Encounters:  08/19/19 122/80  09/10/18 134/80  08/15/18 140/78   Good control

## 2019-08-20 ENCOUNTER — Telehealth: Payer: Self-pay | Admitting: *Deleted

## 2019-08-20 ENCOUNTER — Encounter: Payer: Self-pay | Admitting: Internal Medicine

## 2019-08-20 DIAGNOSIS — Z87891 Personal history of nicotine dependence: Secondary | ICD-10-CM

## 2019-08-20 DIAGNOSIS — I700 Atherosclerosis of aorta: Secondary | ICD-10-CM | POA: Insufficient documentation

## 2019-08-20 DIAGNOSIS — Z122 Encounter for screening for malignant neoplasm of respiratory organs: Secondary | ICD-10-CM

## 2019-08-20 LAB — CBC
Hematocrit: 36.1 % (ref 34.0–46.6)
Hemoglobin: 12.1 g/dL (ref 11.1–15.9)
MCH: 29.1 pg (ref 26.6–33.0)
MCHC: 33.5 g/dL (ref 31.5–35.7)
MCV: 87 fL (ref 79–97)
Platelets: 308 10*3/uL (ref 150–450)
RBC: 4.16 x10E6/uL (ref 3.77–5.28)
RDW: 14.6 % (ref 11.7–15.4)
WBC: 8.8 10*3/uL (ref 3.4–10.8)

## 2019-08-20 LAB — COMPREHENSIVE METABOLIC PANEL
ALT: 31 IU/L (ref 0–32)
AST: 53 IU/L — ABNORMAL HIGH (ref 0–40)
Albumin/Globulin Ratio: 1.4 (ref 1.2–2.2)
Albumin: 4.2 g/dL (ref 3.8–4.8)
Alkaline Phosphatase: 98 IU/L (ref 39–117)
BUN/Creatinine Ratio: 17 (ref 12–28)
BUN: 13 mg/dL (ref 8–27)
Bilirubin Total: 0.3 mg/dL (ref 0.0–1.2)
CO2: 26 mmol/L (ref 20–29)
Calcium: 9.3 mg/dL (ref 8.7–10.3)
Chloride: 103 mmol/L (ref 96–106)
Creatinine, Ser: 0.75 mg/dL (ref 0.57–1.00)
GFR calc Af Amer: 96 mL/min/{1.73_m2} (ref >59)
GFR calc non Af Amer: 83 mL/min/{1.73_m2} (ref >59)
Globulin, Total: 3.1 g/dL (ref 1.5–4.5)
Glucose: 97 mg/dL (ref 65–99)
Potassium: 5 mmol/L (ref 3.5–5.2)
Sodium: 140 mmol/L (ref 134–144)
Total Protein: 7.3 g/dL (ref 6.0–8.5)

## 2019-08-20 LAB — TSH: TSH: 1.08 u[IU]/mL (ref 0.450–4.500)

## 2019-08-20 LAB — T4, FREE: Free T4: 1.36 ng/dL (ref 0.82–1.77)

## 2019-08-20 NOTE — Telephone Encounter (Signed)
Received referral for initial lung cancer screening scan. Contacted patient and obtained smoking history,(former, quit 06/25/10, 44 pack year) as well as answering questions related to screening process. Patient denies signs of lung cancer such as weight loss or hemoptysis. Patient denies comorbidity that would prevent curative treatment if lung cancer were found. Patient is scheduled for shared decision making visit and CT scan on 09/03/19 at 1015am.

## 2019-08-20 NOTE — Addendum Note (Signed)
Addended by: Pilar Grammes on: 08/20/2019 03:19 PM   Modules accepted: Orders

## 2019-09-02 ENCOUNTER — Other Ambulatory Visit: Payer: Self-pay | Admitting: Internal Medicine

## 2019-09-02 ENCOUNTER — Encounter: Payer: Self-pay | Admitting: Oncology

## 2019-09-03 ENCOUNTER — Inpatient Hospital Stay: Payer: BC Managed Care – PPO | Attending: Oncology | Admitting: Oncology

## 2019-09-03 ENCOUNTER — Ambulatory Visit
Admission: RE | Admit: 2019-09-03 | Discharge: 2019-09-03 | Disposition: A | Discharge status: Home / Self Care | Payer: BC Managed Care – PPO | Source: Ambulatory Visit | Attending: Oncology | Admitting: Oncology

## 2019-09-03 ENCOUNTER — Telehealth: Payer: Self-pay | Admitting: Internal Medicine

## 2019-09-03 ENCOUNTER — Other Ambulatory Visit: Payer: Self-pay

## 2019-09-03 DIAGNOSIS — Z122 Encounter for screening for malignant neoplasm of respiratory organs: Secondary | ICD-10-CM

## 2019-09-03 DIAGNOSIS — Z87891 Personal history of nicotine dependence: Secondary | ICD-10-CM

## 2019-09-03 NOTE — Progress Notes (Signed)
Virtual Visit via Video Note  I connected with [name] on 09/03/19 at 10:15 AM EST by a video enabled telemedicine application and verified that I am speaking with the correct person using two identifiers.  Location: Patient: [facility] Provider: Office   I discussed the limitations of evaluation and management by telemedicine and the availability of in person appointments. The patient expressed understanding and agreed to proceed.  I discussed the assessment and treatment plan with the patient. The patient was provided an opportunity to ask questions and all were answered. The patient agreed with the plan and demonstrated an understanding of the instructions.   The patient was advised to call back or seek an in-person evaluation if the symptoms worsen or if the condition fails to improve as anticipated.   In accordance with CMS guidelines, patient has met eligibility criteria including age, absence of signs or symptoms of lung cancer.  Social History   Tobacco Use  . Smoking status: Former Smoker    Packs/day: 1.00    Years: 44.00    Pack years: 44.00    Types: Cigarettes    Quit date: 06/25/2010    Years since quitting: 9.1  . Smokeless tobacco: Never Used  Substance Use Topics  . Alcohol use: No  . Drug use: No      A shared decision-making session was conducted prior to the performance of CT scan. This includes one or more decision aids, includes benefits and harms of screening, follow-up diagnostic testing, over-diagnosis, false positive rate, and total radiation exposure.   Counseling on the importance of adherence to annual lung cancer LDCT screening, impact of co-morbidities, and ability or willingness to undergo diagnosis and treatment is imperative for compliance of the program.   Counseling on the importance of continued smoking cessation for former smokers; the importance of smoking cessation for current smokers, and information about tobacco cessation interventions have  been given to patient including Regina Grimes and 1800 quit Regina Grimes programs.   Written order for lung cancer screening with LDCT has been given to the patient and any and all questions have been answered to the best of my abilities.    Yearly follow up will be coordinated by Regina Grimes, Thoracic Navigator.  I provided 15 minutes of face-to-face video visit time during this encounter, and > 50% was spent counseling as documented under my assessment & plan.   Regina Hawking, NP

## 2019-09-03 NOTE — Telephone Encounter (Signed)
Patient called stating she needs to refill her Methotrexate but was not able to and was told to call the office. She also stated that she wanted to start using the [place] in [place].

## 2019-09-04 MED ORDER — METHOTREXATE 2.5 MG PO TABS: ORAL_TABLET | ORAL | 3 refills | Status: DC

## 2019-09-04 NOTE — Telephone Encounter (Signed)
Rx sent electronically.  

## 2019-09-05 ENCOUNTER — Telehealth: Payer: Self-pay | Admitting: Pulmonary Disease

## 2019-09-05 ENCOUNTER — Telehealth: Payer: Self-pay | Admitting: *Deleted

## 2019-09-05 NOTE — Telephone Encounter (Signed)
Per conversation with Dr. Elsworth Soho. Waiting for schedule for 09/23/19 to be made available in order to schedule the patient. Will check schedule again next week for availability.

## 2019-09-05 NOTE — Telephone Encounter (Signed)
After reviewing results with pulmonary and medical oncology, patient is contacted and reviewed results including incidental findings and is encouraged to discuss these with her PCP. She is also aware of plan to get CT scan of her chest in 3 months and that Dr. Bari Mantis office will likely contact her to make and appointment. Patient verbalizes understanding and agreement with this plan.   IMPRESSION: 1. Lung-RADS 4A. Innumerable tiny ground-glass nodules are seen scattered diffusely in the left lung with more confluent nodular opacities at the left base measuring up to 8.3 mm. Imaging features are likely infectious/inflammatory. Consider therapy and follow up low-dose chest CT without contrast in 3 months (please use the following order, "CT CHEST LCS NODULE FOLLOW-UP W/O CM") is recommended. Alternatively, PET may be considered when there is a solid component 82mm or larger. 2.  Aortic Atherosclerois (ICD10-170.0) 3.  Emphysema. (ICD10-J43.9)

## 2019-09-05 NOTE — Telephone Encounter (Signed)
Please arrange follow-up office visit, routine next available with me/APP

## 2019-09-05 NOTE — Telephone Encounter (Signed)
No action needed from me for now--as long as she follows with Dr Elsworth Soho and has the follow up CT scan

## 2019-09-05 NOTE — Telephone Encounter (Signed)
Spoke with Regina Grimes, I advised her that Dr. Elsworth Soho wanted her to call in a week or two to schedule an appt with him since the schedule is not out yet. Regina Grimes understood and will call us to schedule appt. Nothing further is needed.

## 2019-09-23 ENCOUNTER — Ambulatory Visit: Payer: BC Managed Care – PPO | Admitting: Pulmonary Disease

## 2019-10-01 ENCOUNTER — Ambulatory Visit: Payer: BC Managed Care – PPO | Admitting: Pulmonary Disease

## 2019-10-09 ENCOUNTER — Other Ambulatory Visit: Payer: Self-pay

## 2019-10-09 ENCOUNTER — Encounter: Payer: Self-pay | Admitting: Pulmonary Disease

## 2019-10-09 ENCOUNTER — Ambulatory Visit (INDEPENDENT_AMBULATORY_CARE_PROVIDER_SITE_OTHER): Payer: BC Managed Care – PPO | Admitting: Pulmonary Disease

## 2019-10-09 VITALS — BP 160/96 | HR 75 | Temp 97.3°F | Ht 65.0 in | Wt 169.0 lb

## 2019-10-09 DIAGNOSIS — Z87891 Personal history of nicotine dependence: Secondary | ICD-10-CM

## 2019-10-09 DIAGNOSIS — Z85118 Personal history of other malignant neoplasm of bronchus and lung: Secondary | ICD-10-CM | POA: Diagnosis not present

## 2019-10-09 DIAGNOSIS — R918 Other nonspecific abnormal finding of lung field: Secondary | ICD-10-CM

## 2019-10-09 DIAGNOSIS — R911 Solitary pulmonary nodule: Secondary | ICD-10-CM

## 2019-10-09 DIAGNOSIS — R05 Cough: Secondary | ICD-10-CM

## 2019-10-09 DIAGNOSIS — R059 Cough, unspecified: Secondary | ICD-10-CM

## 2019-10-09 DIAGNOSIS — J449 Chronic obstructive pulmonary disease, unspecified: Secondary | ICD-10-CM

## 2019-10-09 DIAGNOSIS — J4489 Other specified chronic obstructive pulmonary disease: Secondary | ICD-10-CM

## 2019-10-09 NOTE — Assessment & Plan Note (Signed)
   Increase Prilosec to twice daily Call me for antibiotic if sputum turns yellow or green or fever  Strongly recommend COVID vaccine when available for you

## 2019-10-09 NOTE — Assessment & Plan Note (Signed)
Personally reviewed images, favor infectious/inflammatory process given groundglass infiltrates in the left lung, 8 mm largest nodule probably part of the same process -could be tail end of bronchitis/atypical pneumonia that was treated around Thanksgiving or related to GERD related aspiration Malignancy possible but less likely  Follow-up CT chest without contrast in mid March

## 2019-10-09 NOTE — Patient Instructions (Signed)
Follow-up CT chest without contrast in mid March  Increase Prilosec to twice daily Call me for antibiotic if sputum turns yellow or green or fever  Strongly recommend COVID vaccine when available for you

## 2019-10-09 NOTE — Progress Notes (Signed)
   Subjective:    Patient ID: Regina Grimes, female    DOB: [birth year], 69 y.o.   MRN: [id number]  HPI  69 yo ex-smoker with a history of lung cancer for FU  of chronic cough. 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 Follow-up    Patient is here for follow up after CT on 12/9.    She reports an episode of bronchitis around Thanksgiving. On her last visit lisinopril was stopped and her chronic cough improved but about 6 months ago this resurfaced she reports cough productive of scant amounts of white phlegm.  She associates this with restarting blood pressure med by her PCP, this is noted to be valsartan She also reports occasional reflux symptoms Blood pressure is high today  LDCT chest 09/03/2019 was reviewed which shows volume loss in left with centrilobular emphysema, diffuse groundglass opacities in the left lung, 1 area of left nodule measuring 8.3 mm -RADS 4  Significant tests/ events reviewed  PFTs 11/2011 ratio 76, FEV1 66% but improved to 78% with albuterol, 18% bronchodilator response.  TLC was 93% with DLCO 66%   Past Medical History:  Diagnosis Date  . Aortic atherosclerosis ([place])   . Arthritis    arm  . Cancer [facility]) 10/2011   Left Lung cancer with Partial Lobectomy.  . Coughing up blood    10/2011, admitted to [facility]   . GERD (gastroesophageal reflux disease)   . Hiatal hernia   . Hyperlipidemia   . Hypertension    treated for while in hosp. 10/2011, given med. , unsure of name of med., not told to continue   . Osteopenia   . Personal history of chemotherapy   . Psoriasis    has used methotrexate since she was 69y.o., when psoriasis flares. Currently methotrexate on hold since 11/05/2011   . Recurrent upper respiratory infection (URI)    lung infection, treated  /w antibiotic - 10/2011     Review of Systems neg for any significant sore throat, dysphagia, itching, sneezing, nasal congestion or excess/ purulent secretions, fever, chills, sweats, unintended wt loss, pleuritic or exertional cp, hempoptysis, orthopnea pnd or change in chronic leg swelling. Also denies presyncope, palpitations, heartburn, abdominal pain, nausea, vomiting, diarrhea or change in bowel or urinary habits, dysuria,hematuria, rash, arthralgias, visual complaints, headache, numbness weakness or ataxia.     Objective:   Physical Exam  Gen. Pleasant, well-nourished, in no distress ENT - no thrush, no pallor/icterus,no post nasal drip Neck: No JVD, no thyromegaly, no carotid bruits Lungs: no use of accessory muscles, no dullness to percussion, clear without rales or rhonchi  Cardiovascular: Rhythm regular, heart sounds  normal, no murmurs or gallops, no peripheral edema Musculoskeletal: No deformities, no cyanosis or clubbing       Assessment & Plan:

## 2019-10-11 NOTE — Assessment & Plan Note (Signed)
May benefit from LABA/LAMA Check spirometry in the future

## 2019-10-16 MED ORDER — AZITHROMYCIN 250 MG PO TABS: 250.0000 mg | ORAL_TABLET | ORAL | 0 refills | Status: DC

## 2019-10-16 NOTE — Telephone Encounter (Signed)
Sorry to hear that , try mucinex dm Twice daily .As needed  And extra fluids .  If mucus is still discolored , can take Zpack #1 , take as directed.  Can send Zpack #1 , take as directed , no refills  Please contact office for sooner follow up if symptoms do not improve or worsen or seek emergency care '

## 2019-10-16 NOTE — Telephone Encounter (Signed)
I called and spoke with the pt  Notified of recs and verified pharm  rx was sent for the zpack as she has discolored sputum

## 2019-10-16 NOTE — Telephone Encounter (Signed)
Primary Pulmonologist: Elsworth Soho Last office visit and with whom: Elsworth Soho 10/09/19 What do we see them for (pulmonary problems): Abnormal CT Last OV assessment/plan: Instructions    Return in about 2 months (around 12/07/2019) for after CT. Follow-up CT chest without contrast in mid March  Increase Prilosec to twice daily Call me for antibiotic if sputum turns yellow or green or fever  Strongly recommend COVID vaccine when available for you       After Visit Summary (Printed 10/09/2019)   Was appointment offered to patient (explain)?  Pt was just seen- told to call for abx if needed   Reason for call: Pt sent the following email:   When I was there last week I told you I was coughing up some white phlem- I am now coughing up some with greenish color... not every time but it's there now. You had asked me to let you know and you would call me an antibiotic in at the drugstore. If you have any questions please call me. Thank you for all you do... Kelsie  I responded to her and asked if she had fever, chills, SOB or other symptoms to which she responded no. Please advise, thanks!  Allergies  Allergen Reactions  . Aspirin     REACTION: stomach burning  . Codeine     Upset stomach  . Pravastatin Sodium     REACTION: mood changes and snapping at everyone (pt. Is unaware of this allergy)

## 2019-10-24 ENCOUNTER — Ambulatory Visit
Admission: RE | Admit: 2019-10-24 | Discharge: 2019-10-24 | Disposition: A | Discharge status: Home / Self Care | Payer: BC Managed Care – PPO | Source: Ambulatory Visit | Attending: Internal Medicine | Admitting: Internal Medicine

## 2019-10-24 DIAGNOSIS — Z1231 Encounter for screening mammogram for malignant neoplasm of breast: Secondary | ICD-10-CM

## 2019-11-02 ENCOUNTER — Ambulatory Visit: Payer: BC Managed Care – PPO | Attending: Internal Medicine

## 2019-11-02 DIAGNOSIS — Z23 Encounter for immunization: Secondary | ICD-10-CM | POA: Insufficient documentation

## 2019-11-02 NOTE — Progress Notes (Signed)
   Covid-19 Vaccination Clinic  Name:  Regina Grimes    MRN: [id number] DOB: 1950-11-08  11/02/2019  [name] was observed post Covid-19 immunization for 15 minutes without incidence. She was provided with Vaccine Information Sheet and instruction to access the V-Safe system.   [name] was instructed to call 911 with any severe reactions post vaccine: Marland Kitchen Difficulty breathing  . Swelling of your face and throat  . A fast heartbeat  . A bad rash all over your body  . Dizziness and weakness    Immunizations Administered    Name Date Dose VIS Date Route   Pfizer COVID-19 Vaccine 11/02/2019  8:51 AM 0.3 mL 09/05/2019 Intramuscular   Manufacturer: [place]   Lot: [id number]   [place]: [id number]

## 2019-11-20 ENCOUNTER — Other Ambulatory Visit: Payer: Self-pay

## 2019-11-20 MED ORDER — LEVOTHYROXINE SODIUM 88 MCG PO TABS: 88.0000 ug | ORAL_TABLET | Freq: Every day | ORAL | 3 refills | Status: DC

## 2019-11-25 ENCOUNTER — Ambulatory Visit: Payer: BC Managed Care – PPO | Attending: Internal Medicine

## 2019-11-25 DIAGNOSIS — Z23 Encounter for immunization: Secondary | ICD-10-CM | POA: Insufficient documentation

## 2019-11-25 NOTE — Progress Notes (Signed)
   Covid-19 Vaccination Clinic  Name:  Regina Grimes    MRN: [id number] DOB: November 16, 1950  11/25/2019  [name] was observed post Covid-19 immunization for 15 minutes without incident. She was provided with Vaccine Information Sheet and instruction to access the V-Safe system.   [name] was instructed to call 911 with any severe reactions post vaccine: Marland Kitchen Difficulty breathing  . Swelling of face and throat  . A fast heartbeat  . A bad rash all over body  . Dizziness and weakness   Immunizations Administered    Name Date Dose VIS Date Route   Pfizer COVID-19 Vaccine 11/25/2019 10:45 AM 0.3 mL 09/05/2019 Intramuscular   Manufacturer: [place]   Lot: [id number]   [place]: [id number]

## 2019-12-01 ENCOUNTER — Telehealth: Payer: Self-pay | Admitting: Acute Care

## 2019-12-02 NOTE — Telephone Encounter (Signed)
Spoke with pt . She states that [facility] imaging location told her she would have to pay around $1000 for her CT LCS Nodule f/u that was scheduled for 12/03/19 because her insurance only allows 1 CT per year and she wouldn't be eligible again until 08/2020. I advised pt that we can check with [facility] regarding getting CT done for $299. Pt states she is willing to do this. Will cancel f/u appt with Sarah on 12/08/19 and will r/s once the CT is done. Will forward to [place] to get CT rescheduled.

## 2019-12-03 ENCOUNTER — Ambulatory Visit: Admission: RE | Admit: 2019-12-03 | Payer: BC Managed Care – PPO | Source: Ambulatory Visit

## 2019-12-03 NOTE — Telephone Encounter (Signed)
[facility] does have the $299.00 deal but it is only for the 1st or annual LCS CT.  If it is for a 3 month or 6 month follow up CT the amount would go toward the patients deductible. [facility] explained to me that your insurance would pay to have the mammogram but if they find something to biopsy or do follow up scan the same would apply goes toward your deductible. I have called explained this to Regina Grimes and she was willing to pay the 299.00 but she can't afford the 1,200.00. She wanted her appt with Sarah CXL and I did so

## 2019-12-04 NOTE — Telephone Encounter (Signed)
Can be a 20-month follow-up. Please ensure that it was ordered correctly -will be covered if ordered correctly please use the following order, "CT CHEST LCS NODULE FOLLOW-UP W/O CM

## 2019-12-04 NOTE — Telephone Encounter (Signed)
That is the order that we used for the CT. Pt's insurance told her that they would not cover another CT until 08/2020.

## 2019-12-04 NOTE — Telephone Encounter (Signed)
Dr Elsworth Soho, Pt has cancelled the 3 month f/u chest ct that you had ordered due to the cost. Please advise.

## 2019-12-08 ENCOUNTER — Telehealth: Payer: Self-pay | Admitting: Pulmonary Disease

## 2019-12-08 ENCOUNTER — Ambulatory Visit: Payer: BC Managed Care – PPO | Admitting: Acute Care

## 2019-12-08 NOTE — Telephone Encounter (Signed)
Mia from [facility] called today and had the patient on the line with our phone call. Mia kept telling that a PA had to be done for the patient to do this CT. I explained that all PA for [employer] goes through [employer] for CTs.  I had already spoken with [employer] and they stated Prior Auth was Not required ref # [id number]. I gave Mia this refer # and she stated she would need to speak with her supervisor and then call me back.  When Mia from [facility] called me back she stated that Prior Auth was not required but the patient would still have to pay 1,200.00 out of pocket because her deductible restarted 09/2019.  She has not met any of her deductible as of yet.  Mrs. Salamone BCBS plan is a custom plan for her. So no matter where she has the CT done at she will have to pay for the CT  Refer # [id number]  for speaking with Mia today

## 2019-12-08 NOTE — Telephone Encounter (Signed)
Just place order for  CT chest w/o contrast - FU pulm nodule & let pt know about cost please

## 2019-12-08 NOTE — Telephone Encounter (Signed)
Spoke with pt regarding DR Alva's recommendations. PT is going to call her insurance and check the coverage and call me back. Will await call back. Leave in my box to f/u on.

## 2019-12-09 NOTE — Telephone Encounter (Signed)
Error

## 2019-12-10 NOTE — Telephone Encounter (Signed)
I guess so Mia from [facility] was going to call the patient and try to explain this issue to her. Unless the doctor is going to pay for the CT.

## 2019-12-10 NOTE — Telephone Encounter (Signed)
When I spoke with Mia from [facility] yesterday. I ask her to call the patient and explain the issue.  Regina Grimes called the office again today because no one had called her back. I did explain to her what Mia from [facility] had told me. See previous note.  [name] stated that since she got her stimulus check she wanted me to get her CT rescheduled.  I got the CT rescheduled for 12/16/19 @ 9:30am at [facility].  Mrs. Pirro is aware of the CT appt and we also made her follow up appt with Dr. Elsworth Soho on 12/17/19

## 2019-12-10 NOTE — Telephone Encounter (Signed)
Morning Regina Grimes, Is there anything further that needs to be done with this message?  Can it be closed?  Thanks!

## 2019-12-16 ENCOUNTER — Ambulatory Visit
Admission: RE | Admit: 2019-12-16 | Discharge: 2019-12-16 | Disposition: A | Discharge status: Home / Self Care | Payer: BC Managed Care – PPO | Source: Ambulatory Visit | Attending: Pulmonary Disease | Admitting: Pulmonary Disease

## 2019-12-16 ENCOUNTER — Other Ambulatory Visit: Payer: Self-pay

## 2019-12-16 DIAGNOSIS — Z87891 Personal history of nicotine dependence: Secondary | ICD-10-CM | POA: Insufficient documentation

## 2019-12-16 DIAGNOSIS — J432 Centrilobular emphysema: Secondary | ICD-10-CM | POA: Diagnosis not present

## 2019-12-17 ENCOUNTER — Other Ambulatory Visit: Payer: Self-pay | Admitting: Pulmonary Disease

## 2019-12-17 ENCOUNTER — Encounter: Payer: Self-pay | Admitting: Pulmonary Disease

## 2019-12-17 ENCOUNTER — Ambulatory Visit: Payer: BC Managed Care – PPO | Admitting: Pulmonary Disease

## 2019-12-17 DIAGNOSIS — I10 Essential (primary) hypertension: Secondary | ICD-10-CM

## 2019-12-17 DIAGNOSIS — J449 Chronic obstructive pulmonary disease, unspecified: Secondary | ICD-10-CM

## 2019-12-17 MED ORDER — STIOLTO RESPIMAT 2.5-2.5 MCG/ACT IN AERS: 2.0000 | INHALATION_SPRAY | Freq: Every day | RESPIRATORY_TRACT | 0 refills | Status: DC

## 2019-12-17 NOTE — Assessment & Plan Note (Signed)
Nodules have resolved and were likely related to bronchitis/aspiration GERD -She has clinically improved. Annual surveillance low-dose CT, next in March 2021

## 2019-12-17 NOTE — Progress Notes (Signed)
   Subjective:    Patient ID: Regina Grimes, female    DOB: 05-03-1951, 69 y.o.   MRN: [id number]  HPI  69 yo ex-smoker with a history of lung cancer for FU  of chronic cough. 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 Follow-up    Patient is here to go over CT results. Pateint had CT yesterday. Patient feels good overall.   LDCT chest 09/03/2019  showed volume loss in left with centrilobular emphysema, diffuse groundglass opacities in the left lung, 1 area of left nodule measuring 8.3 mm -RADS 4   Felt could be tail end of bronchitis/atypical pneumonia that was treated around Thanksgiving or related to GERD related aspiration  We reviewed repeat CT 11/2019>> showed nodules had decreased in size to 4 mm, 1 new nodule noted left lower lobe again 4 mm, some haziness over the lung fields  Immunizations up-to-date -including Prevnar and Pneumovax Received Covid shots  Significant tests/ events reviewed  PFTs 11/2011 ratio 76, FEV1 66% but improved to 78% with albuterol, 18% bronchodilator response. TLC was 93% with DLCO 66%   Review of Systems Patient denies significant dyspnea,cough, hemoptysis,  chest pain, palpitations, pedal edema, orthopnea, paroxysmal nocturnal dyspnea, lightheadedness, nausea, vomiting, abdominal or  leg pains      Objective:   Physical Exam  Gen. Pleasant, well-nourished, in no distress ENT - no thrush, no pallor/icterus,no post nasal drip Neck: No JVD, no thyromegaly, no carotid bruits Lungs: no use of accessory muscles, no dullness to percussion, clear without rales or rhonchi  Cardiovascular: Rhythm regular, heart sounds  normal, no murmurs or gallops, no peripheral edema Musculoskeletal: No deformities, no cyanosis or clubbing        Assessment & Plan:

## 2019-12-17 NOTE — Assessment & Plan Note (Signed)
Slight worsening from prior baseline Sample of Stiolto-use 2 puffs daily, call me for prescription if this works. Okay to use albuterol 2 puffs as needed for shortness of breath/rescue  Schedule PFTs next visit in 6 months

## 2019-12-17 NOTE — Assessment & Plan Note (Signed)
Hypertensive today, repeat blood pressure, follow-up with PCP Take valsartan daily

## 2019-12-17 NOTE — Patient Instructions (Signed)
Sample of Stiolto-use 2 puffs daily, call me for prescription if this works. Okay to use albuterol 2 puffs as needed for shortness of breath/rescue  Schedule PFTs next visit in 6 months

## 2020-03-18 ENCOUNTER — Telehealth: Payer: Self-pay

## 2020-03-18 NOTE — Telephone Encounter (Signed)
Patient was advise that he would need to go back to site where he was vaccinated for them to register him for the [place] portal for covid vaccine to be update on that site.

## 2020-04-07 DIAGNOSIS — C3490 Malignant neoplasm of unspecified part of unspecified bronchus or lung: Secondary | ICD-10-CM | POA: Diagnosis not present

## 2020-04-07 DIAGNOSIS — J22 Unspecified acute lower respiratory infection: Secondary | ICD-10-CM | POA: Diagnosis not present

## 2020-04-07 DIAGNOSIS — J019 Acute sinusitis, unspecified: Secondary | ICD-10-CM | POA: Diagnosis not present

## 2020-04-15 ENCOUNTER — Other Ambulatory Visit: Payer: Self-pay | Admitting: Internal Medicine

## 2020-06-16 ENCOUNTER — Other Ambulatory Visit: Payer: BC Managed Care – PPO

## 2020-06-18 ENCOUNTER — Ambulatory Visit: Payer: BC Managed Care – PPO | Admitting: Primary Care

## 2020-07-06 ENCOUNTER — Other Ambulatory Visit: Payer: Self-pay | Admitting: Internal Medicine

## 2020-09-13 ENCOUNTER — Telehealth: Payer: Self-pay

## 2020-09-13 NOTE — Telephone Encounter (Signed)
[facility] - Client TELEPHONE ADVICE RECORD [facility] Patient Name: Regina Grimes Gender: Female DOB: 1951/03/06 Age: 69 Y 67 M Return Phone Number: [phone] (Primary) Address: City/State/Zip: [place] [address] Client [facility] - Client Client Site [place] - Day Physician Viviana Simpler- MD Contact Type Call Who Is Calling Patient / Member / Family / Caregiver Call Type Triage / Clinical Relationship To Patient Self Return Phone Number [phone] (Primary) Chief Complaint CHEST PAIN (>=21 years) - pain, pressure, heaviness or tightness Reason for Call Symptomatic / Request for [place] states she is having pain in her knee area with walking difficulty. She also says she's had pain/tightness in chest and tingling. Translation No Nurse Assessment Nurse: Zenia Resides, RN, Diane Date/Time Eilene Ghazi Time): 09/13/2020 12:02:00 PM Confirm and document reason for call. If symptomatic, describe symptoms. ---Caller states she is having pain in her left knee area with walking difficulty. She also says she's had pain/tightness in chest and tingling- lasted for several seconds, but not now. Has a had a twinge or two of chest pain today. Pt. only has one lung. No swelling. No increase in sob. Does the patient have any new or worsening symptoms? ---Yes Will a triage be completed? ---Yes Related visit to physician within the last 2 weeks? ---No Does the PT have any chronic conditions? (i.e. diabetes, asthma, this includes High risk factors for pregnancy, etc.) ---Yes List chronic conditions. ---Has had a lung lobectomy. HTN, hypothyroidism Is this a behavioral health or substance abuse call? ---No Guidelines Guideline Title Affirmed Question Affirmed Notes Nurse Date/Time (Eastern Time) Chest Pain [1] Chest pain (or "angina") comes and goes AND [2] is happening more often  (increasing in frequency) or getting worse (increasing in severity) (Exception: chest pains that last only a few seconds) Zenia Resides, RN, Diane 09/13/2020 12:05:28 PM PLEASE NOTE: All timestamps contained within this report are represented as [place] Standard Time. CONFIDENTIALTY NOTICE: This fax transmission is intended only for the addressee. It contains information that is legally privileged, confidential or otherwise protected from use or disclosure. If you are not the intended recipient, you are strictly prohibited from reviewing, disclosing, copying using or disseminating any of this information or taking any action in reliance on or regarding this information. If you have received this fax in error, please notify us immediately by telephone so that we can arrange for its return to [place]. Phone: [phone], Toll-Free: [phone], Fax: [phone] Page: 2 of 2 Call Id: [id number] Guidelines Guideline Title Affirmed Question Affirmed Notes Nurse Date/Time Eilene Ghazi Time) Leg Pain [1] Thigh or calf pain AND [2] only 1 side AND [3] present > 1 hour (Exception: chronic unchanged pain) Zenia Resides, RN, Diane 09/13/2020 12:06:53 PM Disp. Time Eilene Ghazi Time) Disposition Final User 09/13/2020 12:00:22 PM Send to Urgent Mellody Dance 09/13/2020 12:06:37 PM Go to ED Now Zenia Resides, RN, Diane 09/13/2020 12:08:06 PM See HCP within 4 Hours (or PCP triage) Yes Zenia Resides, RN, Diane Caller Disagree/Comply Comply Caller Understands Yes PreDisposition Did not know what to do Care Advice Given Per Guideline GO TO ED NOW: * You need to be seen in the Emergency Department. ANOTHER ADULT SHOULD DRIVE: * It is better and safer if another adult drives instead of you. CARE ADVICE given per Chest Pain (Adult) guideline. SEE HCP (OR PCP TRIAGE) WITHIN 4 HOURS: * IF OFFICE WILL BE OPEN: You need to be seen within the next 3 or 4 hours. Call your  doctor (or NP/PA) now or as soon as the office opens. CALL BACK IF: *  You become worse CARE ADVICE given per Leg Pain (Adult) guideline. Comments User: Hildred Priest, RN Date/Time Eilene Ghazi Time): 09/13/2020 12:10:53 PM Pt. refusing to go to ER. Wants to see Dr. Silvio Pate. User: Hildred Priest, RN Date/Time Eilene Ghazi Time): 09/13/2020 12:17:51 PM Warm transferred pt. to the office for an appt. as she refused to go to ER. She is not currently having chest pain. Referrals GO TO FACILITY UNDECIDED

## 2020-09-13 NOTE — Telephone Encounter (Signed)
Please check on her tomorrow morning so we can decide about keeping the appt or not

## 2020-09-13 NOTE — Telephone Encounter (Signed)
Per appt notes pt already has appt in office to see Dr Silvio Pate on 09/15/20 at 9:15. I spoke with pt; pt said on 09/12/20 the entire chest hurt and pt felt like she was being hugged by a bear (the tightness was bad). Pt said still some discomfort in chest but not like yesterday. Pt having sharpe pain in lt lower leg on top of leg and below the knee. No redness but is swollen slightly and hurts a lot; pt had never had anything like this before. Pt usually has SOB due to part of lung removed. On 09/12/20 pt had difficulty breathing due to tightness in chest. Pt said she would go to [facility] now for eval and see if possibility of DVT or PE. Pt said she has never had pain in tightness in chest like she had 09/12/20.  Pt does not wan to cancel appt with Dr Silvio Pate at this time. Pt will decide if wants to keep appt with Dr Silvio Pate on 09/15/20 and if not pt will cb to cancel.

## 2020-09-14 NOTE — Telephone Encounter (Signed)
Spoke to pt. She said the swelling and pain went away bout 6pm last night. She said to go ahead and cancel tomorrow's appt. I have done that.

## 2020-09-15 ENCOUNTER — Ambulatory Visit: Payer: BC Managed Care – PPO | Admitting: Internal Medicine

## 2020-09-15 ENCOUNTER — Other Ambulatory Visit: Payer: Self-pay | Admitting: Internal Medicine

## 2020-09-15 DIAGNOSIS — Z1231 Encounter for screening mammogram for malignant neoplasm of breast: Secondary | ICD-10-CM

## 2020-09-24 ENCOUNTER — Other Ambulatory Visit: Payer: Self-pay | Admitting: Internal Medicine

## 2020-10-25 ENCOUNTER — Ambulatory Visit
Admission: RE | Admit: 2020-10-25 | Discharge: 2020-10-25 | Disposition: A | Discharge status: Home / Self Care | Payer: BC Managed Care – PPO | Source: Ambulatory Visit | Attending: Internal Medicine | Admitting: Internal Medicine

## 2020-10-25 ENCOUNTER — Other Ambulatory Visit: Payer: Self-pay

## 2020-10-25 DIAGNOSIS — Z1231 Encounter for screening mammogram for malignant neoplasm of breast: Secondary | ICD-10-CM | POA: Insufficient documentation

## 2020-11-11 ENCOUNTER — Other Ambulatory Visit: Payer: Self-pay | Admitting: Internal Medicine

## 2020-12-02 ENCOUNTER — Telehealth: Payer: Self-pay | Admitting: *Deleted

## 2020-12-02 NOTE — Telephone Encounter (Signed)
Patient scheduled for 01/06/2021 2:00PM No change insurance.

## 2020-12-03 ENCOUNTER — Other Ambulatory Visit: Payer: Self-pay | Admitting: *Deleted

## 2020-12-03 DIAGNOSIS — Z122 Encounter for screening for malignant neoplasm of respiratory organs: Secondary | ICD-10-CM

## 2020-12-03 DIAGNOSIS — Z87891 Personal history of nicotine dependence: Secondary | ICD-10-CM

## 2020-12-03 NOTE — Progress Notes (Signed)
Contacted and scheduled for annual lung screening scan. Patient is a former smoker, quit 06/25/10, 44 pack year history.

## 2020-12-23 ENCOUNTER — Ambulatory Visit (INDEPENDENT_AMBULATORY_CARE_PROVIDER_SITE_OTHER): Payer: BC Managed Care – PPO | Admitting: Internal Medicine

## 2020-12-23 ENCOUNTER — Encounter: Payer: Self-pay | Admitting: Internal Medicine

## 2020-12-23 ENCOUNTER — Other Ambulatory Visit: Payer: Self-pay

## 2020-12-23 VITALS — BP 136/84 | HR 75 | Temp 97.8°F | Ht 64.5 in | Wt 161.0 lb

## 2020-12-23 DIAGNOSIS — E890 Postprocedural hypothyroidism: Secondary | ICD-10-CM | POA: Diagnosis not present

## 2020-12-23 DIAGNOSIS — I700 Atherosclerosis of aorta: Secondary | ICD-10-CM

## 2020-12-23 DIAGNOSIS — J449 Chronic obstructive pulmonary disease, unspecified: Secondary | ICD-10-CM

## 2020-12-23 DIAGNOSIS — I10 Essential (primary) hypertension: Secondary | ICD-10-CM | POA: Diagnosis not present

## 2020-12-23 DIAGNOSIS — L409 Psoriasis, unspecified: Secondary | ICD-10-CM

## 2020-12-23 DIAGNOSIS — Z0000 Encounter for general adult medical examination without abnormal findings: Secondary | ICD-10-CM | POA: Diagnosis not present

## 2020-12-23 MED ORDER — ROSUVASTATIN CALCIUM 20 MG PO TABS: 20.0000 mg | ORAL_TABLET | ORAL | 3 refills | Status: DC

## 2020-12-23 NOTE — Assessment & Plan Note (Signed)
BP Readings from Last 3 Encounters:  12/23/20 136/84  12/17/19 (!) 182/90  10/09/19 (!) 160/96   Okay on valsartan Due for labs

## 2020-12-23 NOTE — Addendum Note (Signed)
Addended by: Cloyd Stagers on: 12/23/2020 04:06 PM   Modules accepted: Orders

## 2020-12-23 NOTE — Assessment & Plan Note (Signed)
Okay without meds Will continue yearly lung cancer screening

## 2020-12-23 NOTE — Assessment & Plan Note (Signed)
On imaging  Will try weekly statin

## 2020-12-23 NOTE — Assessment & Plan Note (Signed)
Uses the methotrexate once a month or so

## 2020-12-23 NOTE — Progress Notes (Signed)
Subjective:    Patient ID: Regina Grimes, female    DOB: 12/14/1950, 70 y.o.   MRN: [id number]  HPI Here for physical This visit occurred during the SARS-CoV-2 public health emergency.  Safety protocols were in place, including screening questions prior to the visit, additional usage of staff PPE, and extensive cleaning of exam room while observing appropriate contact time as indicated for disinfecting solutions.   Feeling really "drained out" for 3-4 weeks Appetite is down some Walking every day---trouble going up hills  Still uses the methotrexate sporadically  When psoriasis flares---takes one dose and it will clear up Averages once a month  Had some chest pains a few months ago Went to [facility]---sent to ER and she wouldn't go  Current Outpatient Medications on File Prior to Visit  Medication Sig Dispense Refill  . fluocinonide ointment (LIDEX) 0.05 % APPLY TO THE AFFECTED AREA TWICE DAILY AS NEEDED FOR PSORIASIS 30 g 5  . folic acid (FOLVITE) 1 MG tablet Take 1 mg by mouth as directed. 1 TABLET ONCE A WEEK AS NEEDED WITH METHOTREXATE.    Marland Kitchen levothyroxine (SYNTHROID) 88 MCG tablet TAKE 1 TABLET(88 MCG) BY MOUTH DAILY 90 tablet 0  . methotrexate 2.5 MG tablet TAKE 6 TABLETS BY MOUTH 1 TIME A WEEK 72 tablet 0  . valsartan (DIOVAN) 80 MG tablet TAKE 1 TABLET BY MOUTH EVERY DAY 90 tablet 3   No current facility-administered medications on file prior to visit.    Allergies  Allergen Reactions  . Aspirin     REACTION: stomach burning  . Codeine     Upset stomach  . Pravastatin Sodium     REACTION: mood changes and snapping at everyone (pt. Is unaware of this allergy)     Past Medical History:  Diagnosis Date  . Aortic atherosclerosis ([place])   . Arthritis    arm  . Cancer [facility]) 10/2011   Left Lung cancer with Partial Lobectomy.  . Coughing up blood    10/2011, admitted to [facility]   . GERD (gastroesophageal reflux disease)   . Hiatal hernia   . Hyperlipidemia    . Hypertension    treated for while in hosp. 10/2011, given med. , unsure of name of med., not told to continue   . Osteopenia   . Personal history of chemotherapy   . Psoriasis    has used methotrexate since she was 70y.o., when psoriasis flares. Currently methotrexate on hold since 11/05/2011   . Recurrent upper respiratory infection (URI)    lung infection, treated /w antibiotic - 10/2011    Past Surgical History:  Procedure Laterality Date  . abnormal PAP     low grade intrepithelial lesion. Colposcipy okay  . APPENDECTOMY     1982- along /w tubal ligation   . Benign tumors of left foot  1992/1994  . BREAST BIOPSY Right 2013   benign  . COLONOSCOPY  8/02  . ESOPHAGOGASTRODUODENOSCOPY  8/02  . FOOT NEUROMA SURGERY     L foot- 1990's  . LUNG REMOVAL, PARTIAL    . THYROIDECTOMY N/A 09/20/2016   Procedure: THYROIDECTOMY;  Surgeon: Clyde Canterbury, MD;  Location: [facility] ORS;  Service: ENT;  Laterality: N/A;  . [place]  . VD     x 2  . VIDEO BRONCHOSCOPY  11/16/2011   Procedure: VIDEO BRONCHOSCOPY WITH FLUORO;  Surgeon: Leanna Sato. Elsworth Soho, MD;  Location: [facility];  Service: Cardiopulmonary;  Laterality: Bilateral;  . VIDEO BRONCHOSCOPY  12/21/2011  Procedure: VIDEO BRONCHOSCOPY;  Surgeon: Gaye Pollack, MD;  Location: [facility] OR;  Service: Thoracic;  Laterality: N/A;    Family History  Problem Relation Age of Onset  . Coronary artery disease Mother   . Heart disease Mother        atrial fibrillation  . Lung cancer Father   . Lung cancer Brother   . Breast cancer Paternal Aunt 50  . Cirrhosis Maternal Grandmother   . Anesthesia problems Neg Hx     Social History   Socioeconomic History  . Marital status: Married    Spouse name: Not on file  . Number of children: 2  . Years of education: Not on file  . Highest education level: Not on file  Occupational History  . 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 Smoking status: Former  Smoker    Packs/day: 1.00    Years: 44.00    Pack years: 44.00    Types: Cigarettes    Quit date: 06/25/2010    Years since quitting: 10.5  . Smokeless tobacco: Never Used  Vaping Use  . Vaping Use: Never used  Substance and Sexual Activity  . Alcohol use: No  . Drug use: No  . Sexual activity: Not on file  Other Topics Concern  . Not on file  Social History Narrative   1 son--died   1 daughter       Has living will   Husband and then daughter would be HC power of attorney   Would accept resuscitation   No tube feeds if cognitively unaware   Social Determinants of Health   Financial Resource Strain: Not on file  Food Insecurity: Not on file  Transportation Needs: Not on file  Physical Activity: Not on file  Stress: Not on file  Social Connections: Not on file  Intimate Partner Violence: Not on file   Review of Systems  Constitutional: Positive for fatigue. Negative for unexpected weight change.       Wears seat belt  HENT: Negative for tinnitus and trouble swallowing.        Husband thinks her hearing isn't great Full dentures  Eyes: Negative for visual disturbance.       Having some floaters No visual loss  Respiratory: Negative for cough and chest tightness.        DOE is stable (like walking up hill)  Cardiovascular: Negative for chest pain, palpitations and leg swelling.  Gastrointestinal: Negative for blood in stool and constipation.       Occ heartburn--tums helps  Endocrine: Negative for polydipsia and polyuria.  Genitourinary: Negative for dyspareunia, dysuria and hematuria.  Musculoskeletal: Negative for back pain and joint swelling.       Points to both CMCs as painful  Skin: Positive for rash.       No suspicious skin lesions  Allergic/Immunologic: Positive for environmental allergies. Negative for immunocompromised state.       Uses claritin D prn  Neurological: Negative for dizziness, syncope, light-headedness and headaches.  Hematological:  Negative for adenopathy. Does not bruise/bleed easily.  Psychiatric/Behavioral: Negative for dysphoric mood. The patient is not nervous/anxious.        Chronic sleep problems--can't maintain Snores but no apnea per husband       Objective:   Physical Exam Constitutional:      Appearance: Normal appearance.  HENT:     Right Ear: Tympanic membrane, ear canal and external ear normal.  Left Ear: Tympanic membrane, ear canal and external ear normal.     Mouth/Throat:     Pharynx: No oropharyngeal exudate or posterior oropharyngeal erythema.  Eyes:     Conjunctiva/sclera: Conjunctivae normal.     Pupils: Pupils are equal, round, and reactive to light.  Cardiovascular:     Rate and Rhythm: Normal rate and regular rhythm.     Heart sounds: No murmur heard. No gallop.      Comments: Faint pedal pulses Pulmonary:     Effort: Pulmonary effort is normal.     Breath sounds: No wheezing or rales.     Comments: Decreased breath sounds at the bases Abdominal:     Palpations: Abdomen is soft.     Tenderness: There is no abdominal tenderness.  Musculoskeletal:     Cervical back: Neck supple.     Right lower leg: No edema.     Left lower leg: No edema.  Lymphadenopathy:     Cervical: No cervical adenopathy.  Skin:    General: Skin is warm.     Comments: Slight plaques on elbows  Neurological:     General: No focal deficit present.     Mental Status: She is alert and oriented to person, place, and time.  Psychiatric:        Mood and Affect: Mood normal.        Behavior: Behavior normal.            Assessment & Plan:

## 2020-12-23 NOTE — Assessment & Plan Note (Signed)
Fairly healthy Due for colon Recent mammogram--continue every 1-2 years till 50 Had second COVID booster Flu vaccine in the fall

## 2020-12-23 NOTE — Patient Instructions (Signed)
Please try melatonin for sleep---start with 3mg  and you can increase to 6mg  or even 9mg  if needed.

## 2020-12-23 NOTE — Assessment & Plan Note (Signed)
Will check labs

## 2020-12-24 ENCOUNTER — Telehealth: Payer: Self-pay

## 2020-12-24 LAB — COMPREHENSIVE METABOLIC PANEL
ALT: 24 IU/L (ref 0–32)
AST: 36 IU/L (ref 0–40)
Albumin/Globulin Ratio: 1.4 (ref 1.2–2.2)
Albumin: 4.4 g/dL (ref 3.8–4.8)
Alkaline Phosphatase: 97 IU/L (ref 44–121)
BUN/Creatinine Ratio: 14 (ref 12–28)
BUN: 12 mg/dL (ref 8–27)
Bilirubin Total: 0.3 mg/dL (ref 0.0–1.2)
CO2: 23 mmol/L (ref 20–29)
Calcium: 9.2 mg/dL (ref 8.7–10.3)
Chloride: 100 mmol/L (ref 96–106)
Creatinine, Ser: 0.87 mg/dL (ref 0.57–1.00)
Globulin, Total: 3.1 g/dL (ref 1.5–4.5)
Glucose: 127 mg/dL — ABNORMAL HIGH (ref 65–99)
Potassium: 4.3 mmol/L (ref 3.5–5.2)
Sodium: 139 mmol/L (ref 134–144)
Total Protein: 7.5 g/dL (ref 6.0–8.5)
eGFR: 73 mL/min/{1.73_m2} (ref >59)

## 2020-12-24 LAB — LIPID PANEL
Chol/HDL Ratio: 9.7 ratio — ABNORMAL HIGH (ref 0.0–4.4)
Cholesterol, Total: 253 mg/dL — ABNORMAL HIGH (ref 100–199)
HDL: 26 mg/dL — ABNORMAL LOW (ref >39)
LDL Chol Calc (NIH): 137 mg/dL — ABNORMAL HIGH (ref 0–99)
Triglycerides: 483 mg/dL — ABNORMAL HIGH (ref 0–149)
VLDL Cholesterol Cal: 90 mg/dL — ABNORMAL HIGH (ref 5–40)

## 2020-12-24 LAB — CBC
Hematocrit: 37.7 % (ref 34.0–46.6)
Hemoglobin: 12.5 g/dL (ref 11.1–15.9)
MCH: 29 pg (ref 26.6–33.0)
MCHC: 33.2 g/dL (ref 31.5–35.7)
MCV: 88 fL (ref 79–97)
Platelets: 288 10*3/uL (ref 150–450)
RBC: 4.31 x10E6/uL (ref 3.77–5.28)
RDW: 13.9 % (ref 11.7–15.4)
WBC: 7.7 10*3/uL (ref 3.4–10.8)

## 2020-12-24 LAB — TSH: TSH: 0.795 u[IU]/mL (ref 0.450–4.500)

## 2020-12-24 LAB — T4, FREE: Free T4: 1.19 ng/dL (ref 0.82–1.77)

## 2020-12-24 NOTE — Telephone Encounter (Signed)
Left a message on VM to confirm whether she was fasting for the visit--if so, we will need to repeat fasting glucose.

## 2021-01-06 ENCOUNTER — Other Ambulatory Visit: Payer: Self-pay

## 2021-01-06 ENCOUNTER — Ambulatory Visit
Admission: RE | Admit: 2021-01-06 | Discharge: 2021-01-06 | Disposition: A | Discharge status: Home / Self Care | Payer: BC Managed Care – PPO | Source: Ambulatory Visit | Attending: Nurse Practitioner | Admitting: Nurse Practitioner

## 2021-01-06 DIAGNOSIS — Z87891 Personal history of nicotine dependence: Secondary | ICD-10-CM | POA: Insufficient documentation

## 2021-01-06 DIAGNOSIS — Z122 Encounter for screening for malignant neoplasm of respiratory organs: Secondary | ICD-10-CM | POA: Diagnosis not present

## 2021-01-12 ENCOUNTER — Encounter: Payer: Self-pay | Admitting: *Deleted

## 2021-01-25 MED ORDER — ALBUTEROL SULFATE HFA 108 (90 BASE) MCG/ACT IN AERS: 2.0000 | INHALATION_SPRAY | Freq: Four times a day (QID) | RESPIRATORY_TRACT | 1 refills | Status: DC | PRN

## 2021-02-08 ENCOUNTER — Encounter: Payer: Self-pay | Admitting: Gastroenterology

## 2021-02-08 ENCOUNTER — Other Ambulatory Visit: Payer: Self-pay | Admitting: Internal Medicine

## 2021-02-12 ENCOUNTER — Other Ambulatory Visit: Payer: Self-pay | Admitting: Internal Medicine

## 2021-02-15 NOTE — Telephone Encounter (Signed)
LOV 12/23/20 CPE No Future appointments. Last filled on 09/26/20 #[id number] 0 refill

## 2021-02-22 DIAGNOSIS — E039 Hypothyroidism, unspecified: Secondary | ICD-10-CM

## 2021-03-13 ENCOUNTER — Other Ambulatory Visit: Payer: Self-pay | Admitting: Internal Medicine

## 2021-04-07 ENCOUNTER — Other Ambulatory Visit: Payer: Self-pay | Admitting: Internal Medicine

## 2021-06-07 DIAGNOSIS — E890 Postprocedural hypothyroidism: Secondary | ICD-10-CM | POA: Diagnosis not present

## 2021-06-07 DIAGNOSIS — R6889 Other general symptoms and signs: Secondary | ICD-10-CM | POA: Diagnosis not present

## 2021-06-27 ENCOUNTER — Encounter: Payer: Self-pay | Admitting: Gastroenterology

## 2021-06-27 ENCOUNTER — Other Ambulatory Visit: Payer: Self-pay | Admitting: Internal Medicine

## 2021-08-23 ENCOUNTER — Ambulatory Visit (AMBULATORY_SURGERY_CENTER): Payer: Self-pay | Admitting: *Deleted

## 2021-08-23 ENCOUNTER — Other Ambulatory Visit: Payer: Self-pay

## 2021-08-23 VITALS — Ht 64.0 in | Wt 141.0 lb

## 2021-08-23 DIAGNOSIS — Z1211 Encounter for screening for malignant neoplasm of colon: Secondary | ICD-10-CM

## 2021-08-23 MED ORDER — NA SULFATE-K SULFATE-MG SULF 17.5-3.13-1.6 GM/177ML PO SOLN: 1.0000 | Freq: Once | ORAL | 0 refills | Status: AC

## 2021-08-23 NOTE — Progress Notes (Signed)
No egg or soy allergy known to patient  No issues known to pt with past sedation with any surgeries or procedures Patient denies ever being told they had issues or difficulty with intubation  No FH of Malignant Hyperthermia Pt is not on diet pills Pt is not on  home 02  Pt is not on blood thinners  Pt states  issues with constipation - can go days without BM- uses laxative and this will relieve her constipation- not a daily hard BM- this is occ  No A fib or A flutter  Pt is fully vaccinated  for Covid  In person PV todayu   NO PA's for preps discussed with pt In PV today  Discussed with pt there will be an out-of-pocket cost for prep and that varies from $0 to 70 +  dollars - pt verbalized understanding   Due to the COVID-19 pandemic we are asking patients to follow certain guidelines in PV and the [place]   Pt aware of COVID protocols and [facility] guidelines

## 2021-09-01 ENCOUNTER — Other Ambulatory Visit: Payer: Self-pay | Admitting: Internal Medicine

## 2021-09-06 ENCOUNTER — Encounter: Payer: Self-pay | Admitting: Gastroenterology

## 2021-09-06 ENCOUNTER — Other Ambulatory Visit: Payer: Self-pay

## 2021-09-06 ENCOUNTER — Ambulatory Visit (AMBULATORY_SURGERY_CENTER): Payer: BC Managed Care – PPO | Admitting: Gastroenterology

## 2021-09-06 VITALS — BP 124/69 | HR 58 | Temp 97.1°F | Resp 13 | Ht 64.5 in | Wt 138.0 lb

## 2021-09-06 DIAGNOSIS — D123 Benign neoplasm of transverse colon: Secondary | ICD-10-CM | POA: Diagnosis not present

## 2021-09-06 DIAGNOSIS — D124 Benign neoplasm of descending colon: Secondary | ICD-10-CM | POA: Diagnosis not present

## 2021-09-06 DIAGNOSIS — Z1211 Encounter for screening for malignant neoplasm of colon: Secondary | ICD-10-CM

## 2021-09-06 MED ORDER — SODIUM CHLORIDE 0.9 % IV SOLN: 500.0000 mL | Freq: Once | INTRAVENOUS | Status: DC

## 2021-09-06 NOTE — Progress Notes (Signed)
Pt's states no medical or surgical changes since previsit or office visit.   CHECK-IN-AER  V/S-DT

## 2021-09-06 NOTE — Progress Notes (Signed)
Called to room to assist during endoscopic procedure.  Patient ID and intended procedure confirmed with present staff. Received instructions for my participation in the procedure from the performing physician.  

## 2021-09-06 NOTE — Progress Notes (Signed)
Report given to PACU, vss 

## 2021-09-06 NOTE — Progress Notes (Signed)
History & Physical  Primary Care Physician:  Venia Carbon, MD Primary Gastroenterologist: Lucio Edward, MD  CHIEF COMPLAINT:  CRC screening  HPI: Regina Grimes is a 70 y.o. female who is average risk for CRC here for screening colonoscopy.    Past Medical History:  Diagnosis Date   Allergy    seasonal- claritin D PRN   Anemia    past hx many years  ago   Aortic atherosclerosis ([place])    Arthritis    arm   Cancer ([place]) 10/2011   Left Lung cancer with Partial Lobectomy.   Coughing up blood    10/2011, admitted to [place]    GERD (gastroesophageal reflux disease)    Hiatal hernia    Hyperlipidemia    Hypertension    treated for while in hosp. 10/2011, given med. , unsure of name of med., not told to continue    Osteopenia    Personal history of chemotherapy    completed 03-2012   Psoriasis    has used methotrexate since she was 70y.o., when psoriasis flares. Currently methotrexate on hold since 11/05/2011    Recurrent upper respiratory infection (URI)    lung infection, treated /w antibiotic - 10/2011   Thyroid disease     Past Surgical History:  Procedure Laterality Date   abnormal PAP     low grade intrepithelial lesion. Colposcipy okay   APPENDECTOMY     1982- along /w tubal ligation    Benign tumors of left foot  1992/1994   BREAST BIOPSY Right 2013   benign   COLONOSCOPY  04/2001   ESOPHAGOGASTRODUODENOSCOPY  04/2001   FOOT NEUROMA SURGERY     L foot- 1990's   LUNG REMOVAL, PARTIAL  2013   POLYPECTOMY     [facility] 2012   THYROIDECTOMY N/A 09/20/2016   Procedure: THYROIDECTOMY;  Surgeon: Clyde Canterbury, MD;  Location: [facility] ORS;  Service: ENT;  Laterality: N/A;   TUBAL LIGATION  1982   UPPER GASTROINTESTINAL ENDOSCOPY     VD     x 2   VIDEO BRONCHOSCOPY  11/16/2011   Procedure: VIDEO BRONCHOSCOPY WITH FLUORO;  Surgeon: Leanna Sato. Elsworth Soho, MD;  Location: [facility];  Service: Cardiopulmonary;  Laterality: Bilateral;   VIDEO BRONCHOSCOPY  12/21/2011    Procedure: VIDEO BRONCHOSCOPY;  Surgeon: Gaye Pollack, MD;  Location: [facility];  Service: Thoracic;  Laterality: N/A;    Prior to Admission medications   Medication Sig Start Date End Date Taking? Authorizing Provider  fluocinonide ointment (LIDEX) 0.05 % APPLY TO THE AFFECTED AREA TWICE DAILY AS NEEDED FOR PSORIASIS 09/01/21  Yes Viviana Simpler I, MD  valsartan (DIOVAN) 80 MG tablet TAKE 1 TABLET BY MOUTH EVERY DAY 06/27/21  Yes Viviana Simpler I, MD  albuterol (VENTOLIN HFA) 108 (90 Base) MCG/ACT inhaler INHALE 2 PUFFS INTO THE LUNGS EVERY 6 HOURS AS NEEDED FOR WHEEZING OR SHORTNESS OF BREATH 04/07/21   Venia Carbon, MD  fluticasone (FLONASE) 50 MCG/ACT nasal spray Place into the nose. 08/07/19   [provider]  folic acid (FOLVITE) 1 MG tablet Take 1 mg by mouth as directed. 1 TABLET ONCE A WEEK AS NEEDED WITH METHOTREXATE.    [provider]  levothyroxine (SYNTHROID) 88 MCG tablet TAKE 1 TABLET(88 MCG) BY MOUTH DAILY 02/08/21   Viviana Simpler I, MD  methotrexate (RHEUMATREX) 2.5 MG tablet TAKE 6 TABLETS BY MOUTH 1 TIME A WEEK 02/16/21   Venia Carbon, MD  omeprazole (PRILOSEC) 20 MG capsule  Take by mouth.    [provider]  rosuvastatin (CRESTOR) 20 MG tablet Take 1 tablet (20 mg total) by mouth once a week. Patient not taking: Reported on 08/23/2021 12/23/20   Venia Carbon, MD    Current Outpatient Medications  Medication Sig Dispense Refill   fluocinonide ointment (LIDEX) 0.05 % APPLY TO THE AFFECTED AREA TWICE DAILY AS NEEDED FOR PSORIASIS 30 g 5   valsartan (DIOVAN) 80 MG tablet TAKE 1 TABLET BY MOUTH EVERY DAY 90 tablet 1   albuterol (VENTOLIN HFA) 108 (90 Base) MCG/ACT inhaler INHALE 2 PUFFS INTO THE LUNGS EVERY 6 HOURS AS NEEDED FOR WHEEZING OR SHORTNESS OF BREATH 8.5 g 0   fluticasone (FLONASE) 50 MCG/ACT nasal spray Place into the nose.     folic acid (FOLVITE) 1 MG tablet Take 1 mg by mouth as directed. 1 TABLET ONCE A WEEK AS NEEDED WITH  METHOTREXATE.     levothyroxine (SYNTHROID) 88 MCG tablet TAKE 1 TABLET(88 MCG) BY MOUTH DAILY 90 tablet 3   methotrexate (RHEUMATREX) 2.5 MG tablet TAKE 6 TABLETS BY MOUTH 1 TIME A WEEK 72 tablet 2   omeprazole (PRILOSEC) 20 MG capsule Take by mouth.     rosuvastatin (CRESTOR) 20 MG tablet Take 1 tablet (20 mg total) by mouth once a week. (Patient not taking: Reported on 08/23/2021) 13 tablet 3   Current Facility-Administered Medications  Medication Dose Route Frequency Provider Last Rate Last Admin   0.9 %  sodium chloride infusion  500 mL Intravenous Once Ladene Artist, MD        Allergies as of 09/06/2021 - Review Complete 09/06/2021  Allergen Reaction Noted   Aspirin  12/06/2010   Codeine  11/15/2011   Pravastatin sodium  04/23/2008    Family History  Problem Relation Age of Onset   Coronary artery disease Mother    Heart disease Mother        atrial fibrillation   Lung cancer Father    Lung cancer Brother    Breast cancer Paternal Aunt 60   Cirrhosis Maternal Grandmother    Anesthesia problems Neg Hx    Colon cancer Neg Hx    Colon polyps Neg Hx    Esophageal cancer Neg Hx    Stomach cancer Neg Hx    Rectal cancer Neg Hx     Social History   Socioeconomic History   Marital status: Married    Spouse name: Not on file   Number of children: 2   Years of education: Not on file   Highest education level: Not on file  Occupational History   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  Tobacco Use   Smoking status: Former    Packs/day: 1.00    Years: 44.00    Pack years: 44.00    Types: Cigarettes    Quit date: 06/25/2010    Years since quitting: 11.2   Smokeless tobacco: Never  Vaping Use   Vaping Use: Never used  Substance and Sexual Activity   Alcohol use: Yes    Comment: O[facility]   Drug use: No   Sexual activity: Not on file  Other Topics Concern   Not on file  Social History Narrative   1 son--died   1 daughter       Has living will    Husband and then daughter would be HC power of attorney   Would accept resuscitation   No tube feeds if cognitively unaware   Social  Determinants of Health   Financial Resource Strain: Not on file  Food Insecurity: Not on file  Transportation Needs: Not on file  Physical Activity: Not on file  Stress: Not on file  Social Connections: Not on file  Intimate Partner Violence: Not on file    Review of Systems:  All systems reviewed an negative except where noted in HPI.  Gen: Denies any fever, chills, sweats, anorexia, fatigue, weakness, malaise, weight loss, and sleep disorder CV: Denies chest pain, angina, palpitations, syncope, orthopnea, PND, peripheral edema, and claudication. Resp: Denies dyspnea at rest, dyspnea with exercise, cough, sputum, wheezing, coughing up blood, and pleurisy. GI: Denies vomiting blood, jaundice, and fecal incontinence.   Denies dysphagia or odynophagia. GU : Denies urinary burning, blood in urine, urinary frequency, urinary hesitancy, nocturnal urination, and urinary incontinence. MS: Denies joint pain, limitation of movement, and swelling, stiffness, low back pain, extremity pain. Denies muscle weakness, cramps, atrophy.  Derm: Denies rash, itching, dry skin, hives, moles, warts, or unhealing ulcers.  Psych: Denies depression, anxiety, memory loss, suicidal ideation, hallucinations, paranoia, and confusion. Heme: Denies bruising, bleeding, and enlarged lymph nodes. Neuro:  Denies any headaches, dizziness, paresthesias. Endo:  Denies any problems with DM, thyroid, adrenal function.   Physical Exam: General:  Alert, well-developed, in NAD Head:  Normocephalic and atraumatic. Eyes:  Sclera clear, no icterus.   Conjunctiva pink. Ears:  Normal auditory acuity. Mouth:  No deformity or lesions.  Neck:  Supple; no masses . Lungs:  Clear throughout to auscultation.   No wheezes, crackles, or rhonchi. No acute distress. Heart:  Regular rate and rhythm; no  murmurs. Abdomen:  Soft, nondistended, nontender. No masses, hepatomegaly. No obvious masses.  Normal bowel .    Rectal:  Deferred   Msk:  Symmetrical without gross deformities.. Pulses:  Normal pulses noted. Extremities:  Without edema. Neurologic:  Alert and  oriented x4;  grossly normal neurologically. Skin:  Intact without significant lesions or rashes. Cervical Nodes:  No significant cervical adenopathy. Psych:  Alert and cooperative. Normal mood and affect.   Impression / Plan:   CRC screening, average risk, for screening colonoscopy.   Pricilla Riffle. Fuller Plan  09/06/2021, 10:29 AM See Shea Evans, [facility], to contact our on call provider

## 2021-09-06 NOTE — Patient Instructions (Signed)
Handouts given on Polyps, Diverticulosis and high fiber diet.  YOU HAD AN ENDOSCOPIC PROCEDURE TODAY AT [facility]:   Refer to the procedure report that was given to you for any specific questions about what was found during the examination.  If the procedure report does not answer your questions, please call your gastroenterologist to clarify.  If you requested that your care partner not be given the details of your procedure findings, then the procedure report has been included in a sealed envelope for you to review at your convenience later.  YOU SHOULD EXPECT: Some feelings of bloating in the abdomen. Passage of more gas than usual.  Walking can help get rid of the air that was put into your GI tract during the procedure and reduce the bloating. If you had a lower endoscopy (such as a colonoscopy or flexible sigmoidoscopy) you may notice spotting of blood in your stool or on the toilet paper. If you underwent a bowel prep for your procedure, you may not have a normal bowel movement for a few days.  Please Note:  You might notice some irritation and congestion in your nose or some drainage.  This is from the oxygen used during your procedure.  There is no need for concern and it should clear up in a day or so.  SYMPTOMS TO REPORT IMMEDIATELY:  Following lower endoscopy (colonoscopy or flexible sigmoidoscopy):  Excessive amounts of blood in the stool  Significant tenderness or worsening of abdominal pains  Swelling of the abdomen that is new, acute  Fever of 100F or higher  For urgent or emergent issues, a gastroenterologist can be reached at any hour by calling [phone]. Do not use MyChart messaging for urgent concerns.    DIET:  We do recommend a small meal at first, but then you may proceed to your regular diet.  Drink plenty of fluids but you should avoid alcoholic beverages for 24 hours.  ACTIVITY:  You should plan to take it easy for the rest of today and you  should NOT DRIVE or use heavy machinery until tomorrow (because of the sedation medicines used during the test).    FOLLOW UP: Our staff will call the number listed on your records 48-72 hours following your procedure to check on you and address any questions or concerns that you may have regarding the information given to you following your procedure. If we do not reach you, we will leave a message.  We will attempt to reach you two times.  During this call, we will ask if you have developed any symptoms of COVID 19. If you develop any symptoms (ie: fever, flu-like symptoms, shortness of breath, cough etc.) before then, please call [phone].  If you test positive for Covid 19 in the 2 weeks post procedure, please call and report this information to [place].    If any biopsies were taken you will be contacted by phone or by letter within the next 1-3 weeks.  Please call us at [phone] if you have not heard about the biopsies in 3 weeks.    SIGNATURES/CONFIDENTIALITY: You and/or your care partner have signed paperwork which will be entered into your electronic medical record.  These signatures attest to the fact that that the information above on your After Visit Summary has been reviewed and is understood.  Full responsibility of the confidentiality of this discharge information lies with you and/or your care-partner.

## 2021-09-06 NOTE — Op Note (Signed)
[place] [facility] Patient Name: Regina Grimes Procedure Date: 09/06/2021 10:23 AM MRN: [id number] Endoscopist: Ladene Artist , MD Age: 70 Referring MD:  Date of Birth: April 24, 1951 Gender: Female Account #: [id number] Procedure:                Colonoscopy Indications:              Screening for colorectal malignant neoplasm Medicines:                Monitored Anesthesia Care Procedure:                Pre-Anesthesia Assessment:                           - Prior to the procedure, a History and Physical                            was performed, and patient medications and                            allergies were reviewed. The patient's tolerance of                            previous anesthesia was also reviewed. The risks                            and benefits of the procedure and the sedation                            options and risks were discussed with the patient.                            All questions were answered, and informed consent                            was obtained. Prior Anticoagulants: The patient has                            taken no previous anticoagulant or antiplatelet                            agents. ASA Grade Assessment: II - A patient with                            mild systemic disease. After reviewing the risks                            and benefits, the patient was deemed in                            satisfactory condition to undergo the procedure.                           After obtaining informed consent, the colonoscope  was passed under direct vision. Throughout the                            procedure, the patient's blood pressure, pulse, and                            oxygen saturations were monitored continuously. The                            Olympus PCF-H190DL ([id number]) Colonoscope was                            introduced through the anus and advanced to the the                            cecum,  identified by appendiceal orifice and                            ileocecal valve. The ileocecal valve, appendiceal                            orifice, and rectum were photographed. The quality                            of the bowel preparation was adequate. The                            colonoscopy was performed without difficulty. The                            patient tolerated the procedure well. Scope In: 10:32:41 AM Scope Out: 10:52:34 AM Scope Withdrawal Time: 0 hours 15 minutes 16 seconds  Total Procedure Duration: 0 hours 19 minutes 53 seconds  Findings:                 The perianal and digital rectal examinations were                            normal.                           A 4 mm polyp was found in the transverse colon. The                            polyp was sessile. The polyp was removed with a                            cold biopsy forceps. Resection and retrieval were                            complete.                           A 6 mm polyp was found in the descending colon. The  polyp was sessile. The polyp was removed with a                            cold snare. Resection and retrieval were complete.                           Multiple medium-mouthed diverticula were found in                            the left colon. There was narrowing of the colon in                            association with the diverticular opening. There                            was evidence of diverticular spasm. There was no                            evidence of diverticular bleeding.                           The exam was otherwise without abnormality on                            direct and retroflexion views. Complications:            No immediate complications. Estimated blood loss:                            None. Estimated Blood Loss:     Estimated blood loss: none. Impression:               - One 4 mm polyp in the transverse colon, removed                             with a cold biopsy forceps. Resected and retrieved.                           - One 6 mm polyp in the descending colon, removed                            with a cold snare. Resected and retrieved.                           - Moderate diverticulosis in the left colon.                           - The examination was otherwise normal on direct                            and retroflexion views. Recommendation:           - Repeat colonoscopy date to be determined after  pending pathology results are reviewed for                            surveillance of multiple polyps.                           - Patient has a contact number available for                            emergencies. The signs and symptoms of potential                            delayed complications were discussed with the                            patient. Return to normal activities tomorrow.                            Written discharge instructions were provided to the                            patient.                           - High fiber diet.                           - Continue present medications.                           - Await pathology results. Ladene Artist, MD 09/06/2021 10:57:50 AM This report has been signed electronically.

## 2021-09-08 ENCOUNTER — Telehealth: Payer: Self-pay | Admitting: *Deleted

## 2021-09-08 NOTE — Telephone Encounter (Signed)
°  Follow up Call-  Call back number 09/06/2021  Post procedure Call Back phone  # [phone]  Permission to leave phone message Yes  Some recent data might be hidden     Patient questions:  Do you have a fever, pain , or abdominal swelling? No. Pain Score  0 *  Have you tolerated food without any problems? Yes.    Have you been able to return to your normal activities? Yes.    Do you have any questions about your discharge instructions: Diet   No. Medications  No. Follow up visit  No.  Do you have questions or concerns about your Care? No.  Actions: * If pain score is 4 or above: No action needed, pain <4.  Have you developed a fever since your procedure? no  2.   Have you had an respiratory symptoms (SOB or cough) since your procedure? no  3.   Have you tested positive for COVID 19 since your procedure no  4.   Have you had any family members/close contacts diagnosed with the COVID 19 since your procedure?  no   If yes to any of these questions please route to Joylene John, RN and Joella Prince, RN

## 2021-09-08 NOTE — Telephone Encounter (Signed)
°  Follow up Call-  Call back number 09/06/2021  Post procedure Call Back phone  # [phone]  Permission to leave phone message Yes  Some recent data might be hidden     Patient questions:  Do you have a fever, pain , or abdominal swelling? No. Pain Score  0 *  Have you tolerated food without any problems? Yes.    Have you been able to return to your normal activities? Yes.    Do you have any questions about your discharge instructions: Diet   No. Medications  No. Follow up visit  No.  Do you have questions or concerns about your Care? No.  Actions: * If pain score is 4 or above: No action needed, pain <4.  Have you developed a fever since your procedure? no  2.   Have you had an respiratory symptoms (SOB or cough) since your procedure? no  3.   Have you tested positive for COVID 19 since your procedure no  4.   Have you had any family members/close contacts diagnosed with the COVID 19 since your procedure?  no   If yes to any of these questions please route to Joylene John, RN and Joella Prince, RN

## 2021-09-19 ENCOUNTER — Encounter: Payer: Self-pay | Admitting: Gastroenterology

## 2021-09-27 ENCOUNTER — Other Ambulatory Visit: Payer: Self-pay | Admitting: Internal Medicine

## 2021-09-27 DIAGNOSIS — Z1231 Encounter for screening mammogram for malignant neoplasm of breast: Secondary | ICD-10-CM

## 2021-10-28 ENCOUNTER — Other Ambulatory Visit: Payer: Self-pay

## 2021-10-28 ENCOUNTER — Ambulatory Visit
Admission: RE | Admit: 2021-10-28 | Discharge: 2021-10-28 | Disposition: A | Discharge status: Home / Self Care | Payer: BC Managed Care – PPO | Source: Ambulatory Visit | Attending: Internal Medicine | Admitting: Internal Medicine

## 2021-10-28 DIAGNOSIS — Z1231 Encounter for screening mammogram for malignant neoplasm of breast: Secondary | ICD-10-CM | POA: Insufficient documentation

## 2021-12-22 ENCOUNTER — Other Ambulatory Visit: Payer: Self-pay | Admitting: Internal Medicine

## 2021-12-29 ENCOUNTER — Other Ambulatory Visit: Payer: BC Managed Care – PPO

## 2022-01-06 ENCOUNTER — Encounter: Payer: Self-pay | Admitting: Internal Medicine

## 2022-01-06 ENCOUNTER — Ambulatory Visit (INDEPENDENT_AMBULATORY_CARE_PROVIDER_SITE_OTHER): Payer: BC Managed Care – PPO | Admitting: Internal Medicine

## 2022-01-06 VITALS — BP 140/80 | HR 70 | Ht 65.0 in | Wt 145.5 lb

## 2022-01-06 DIAGNOSIS — L409 Psoriasis, unspecified: Secondary | ICD-10-CM

## 2022-01-06 DIAGNOSIS — I10 Essential (primary) hypertension: Secondary | ICD-10-CM | POA: Diagnosis not present

## 2022-01-06 DIAGNOSIS — I700 Atherosclerosis of aorta: Secondary | ICD-10-CM | POA: Diagnosis not present

## 2022-01-06 DIAGNOSIS — E890 Postprocedural hypothyroidism: Secondary | ICD-10-CM

## 2022-01-06 DIAGNOSIS — J449 Chronic obstructive pulmonary disease, unspecified: Secondary | ICD-10-CM | POA: Diagnosis not present

## 2022-01-06 DIAGNOSIS — Z0000 Encounter for general adult medical examination without abnormal findings: Secondary | ICD-10-CM

## 2022-01-06 NOTE — Assessment & Plan Note (Signed)
Urged her to resume the weekly rosuvastatin 20 ?

## 2022-01-06 NOTE — Assessment & Plan Note (Signed)
Uses 15mg  methotrexate about once a month ?On folic acid ?

## 2022-01-06 NOTE — Assessment & Plan Note (Signed)
Seems euthyroid ?Will check labs on the 43mcg dose ?

## 2022-01-06 NOTE — Progress Notes (Signed)
? ?Subjective:  ? ? Patient ID: Regina Grimes, female    DOB: December 20, 1950, 71 y.o.   MRN: [id number] ? ?HPI ?Here for physical ? ?Did have the colonoscopy ?Did have polyps--recall 7 years ? ?Has knot on left wrist ?Pain at times ?Goes back 6 months or more ? ?Uses the methotrexate prn ?At most 15mg  monthly--more in the summer (psoriasis more noticeable) ? ?Went to endocrinologist--wanted to make a number of changes (due to 6PM "coldness") ? ?Current Outpatient Medications on File Prior to Visit  ?Medication Sig Dispense Refill  ? albuterol (VENTOLIN HFA) 108 (90 Base) MCG/ACT inhaler INHALE 2 PUFFS INTO THE LUNGS EVERY 6 HOURS AS NEEDED FOR WHEEZING OR SHORTNESS OF BREATH 8.5 g 0  ? fluocinonide ointment (LIDEX) 0.05 % APPLY TO THE AFFECTED AREA TWICE DAILY AS NEEDED FOR PSORIASIS 30 g 5  ? fluticasone (FLONASE) 50 MCG/ACT nasal spray Place into the nose.    ? folic acid (FOLVITE) 1 MG tablet Take 1 mg by mouth as directed. 1 TABLET ONCE A WEEK AS NEEDED WITH METHOTREXATE.    ? levothyroxine (SYNTHROID) 88 MCG tablet TAKE 1 TABLET(88 MCG) BY MOUTH DAILY 90 tablet 3  ? methotrexate (RHEUMATREX) 2.5 MG tablet TAKE 6 TABLETS BY MOUTH 1 TIME A WEEK 72 tablet 2  ? omeprazole (PRILOSEC) 20 MG capsule Take by mouth.    ? rosuvastatin (CRESTOR) 20 MG tablet Take 1 tablet (20 mg total) by mouth once a week. 13 tablet 3  ? valsartan (DIOVAN) 80 MG tablet TAKE 1 TABLET BY MOUTH EVERY DAY 90 tablet 1  ? ?No current facility-administered medications on file prior to visit.  ? ? ?Allergies  ?Allergen Reactions  ? Aspirin   ?  REACTION: stomach burning  ? Codeine   ?  Upset stomach  ? Pravastatin Sodium   ?  REACTION: mood changes and snapping at everyone (pt. Is unaware of this allergy) ?  ? ? ?Past Medical History:  ?Diagnosis Date  ? Allergy   ? seasonal- claritin D PRN  ? Anemia   ? past hx many years  ago  ? Aortic atherosclerosis ([place])   ? Arthritis   ? arm  ? Cancer ([place]) 10/2011  ? Left Lung cancer with Partial Lobectomy.   ? Coughing up blood   ? 10/2011, admitted to [place]   ? GERD (gastroesophageal reflux disease)   ? Hiatal hernia   ? Hyperlipidemia   ? Hypertension   ? treated for while in hosp. 10/2011, given med. , unsure of name of med., not told to continue   ? Osteopenia   ? Personal history of chemotherapy   ? completed 03-2012  ? Psoriasis   ? has used methotrexate since she was 71y.o., when psoriasis flares. Currently methotrexate on hold since 11/05/2011   ? Recurrent upper respiratory infection (URI)   ? lung infection, treated /w antibiotic - 10/2011  ? Thyroid disease   ? ? ?Past Surgical History:  ?Procedure Laterality Date  ? abnormal PAP    ? low grade intrepithelial lesion. Colposcipy okay  ? APPENDECTOMY    ? 1982- along /w tubal ligation   ? Benign tumors of left foot  1992/1994  ? BREAST BIOPSY Right 2013  ? benign  ? COLONOSCOPY  04/2001  ? ESOPHAGOGASTRODUODENOSCOPY  04/2001  ? FOOT NEUROMA SURGERY    ? L foot- 1990's  ? LUNG REMOVAL, PARTIAL  2013  ? POLYPECTOMY    ? HPP 2012  ? THYROIDECTOMY N/A  09/20/2016  ? Procedure: THYROIDECTOMY;  Surgeon: Clyde Canterbury, MD;  Location: [facility];  Service: ENT;  Laterality: N/A;  ? [address]  ? UPPER GASTROINTESTINAL ENDOSCOPY    ? VD    ? x 2  ? VIDEO BRONCHOSCOPY  11/16/2011  ? Procedure: VIDEO BRONCHOSCOPY WITH FLUORO;  Surgeon: Leanna Sato. Elsworth Soho, MD;  Location: [facility];  Service: Cardiopulmonary;  Laterality: Bilateral;  ? VIDEO BRONCHOSCOPY  12/21/2011  ? Procedure: VIDEO BRONCHOSCOPY;  Surgeon: Gaye Pollack, MD;  Location: [facility];  Service: Thoracic;  Laterality: N/A;  ? ? ?Family History  ?Problem Relation Age of Onset  ? Coronary artery disease Mother   ? Heart disease Mother   ?     atrial fibrillation  ? Lung cancer Father   ? Lung cancer Brother   ? Breast cancer Paternal Aunt 90  ? Cirrhosis Maternal Grandmother   ? Anesthesia problems Neg Hx   ? Colon cancer Neg Hx   ? Colon polyps Neg Hx   ? Esophageal cancer Neg Hx   ? Stomach cancer Neg Hx   ?  Rectal cancer Neg Hx   ? ? ?Social History  ? ?Socioeconomic History  ? Marital status: Married  ?  Spouse name: Not on file  ? Number of children: 2  ? Years of education: Not on file  ? Highest education level: Not on file  ?Occupational History  ? Occupation: Horticulturist, commercial  ?  Comment: Retired  ?Tobacco Use  ? Smoking status: Former  ?  Packs/day: 1.00  ?  Years: 44.00  ?  Pack years: 44.00  ?  Types: Cigarettes  ?  Quit date: 06/25/2010  ?  Years since quitting: 11.5  ? Smokeless tobacco: Never  ?Vaping Use  ? Vaping Use: Never used  ?Substance and Sexual Activity  ? Alcohol use: Yes  ?  Comment: OCC  ? Drug use: No  ? Sexual activity: Not on file  ?Other Topics Concern  ? Not on file  ?Social History Narrative  ? 1 son--died  ? 1 daughter   ?   ? Has living will  ? Husband and then daughter would be [facility] power of attorney  ? Would accept resuscitation  ? No tube feeds if cognitively unaware  ? ?Social Determinants of Health  ? ?Financial Resource Strain: Not on file  ?Food Insecurity: Not on file  ?Transportation Needs: Not on file  ?Physical Activity: Not on file  ?Stress: Not on file  ?Social Connections: Not on file  ?Intimate Partner Violence: Not on file  ? ?Review of Systems  ?Constitutional:  Negative for fatigue.  ?     Lost 15# in the past year---now using treadmill regularly ?Wears seat belt  ?HENT:  Negative for hearing loss, tinnitus and trouble swallowing.   ?     Full dentures  ?Eyes:  Negative for visual disturbance.  ?     No diplopia or unilateral vision loss  ?Respiratory:  Negative for shortness of breath and wheezing.   ?     Uses albuterol occasionally for chest tightness--helps ?No regular cough  ?Cardiovascular:  Negative for chest pain and leg swelling.  ?     Rare palpitations  ?Gastrointestinal:  Negative for blood in stool and constipation.  ?     Occ heartburn---avoids greasy foods. Uses prilosec daily/prn mylanta (rare)  ?Endocrine: Negative for polydipsia and polyuria.   ?Genitourinary:  Negative for difficulty urinating, dyspareunia, dysuria and hematuria.  ?  Musculoskeletal:  Negative for arthralgias, back pain and joint swelling.  ?Skin:   ?     Psoriasis on low back and elbows mostly ?No suspicious skin lesions  ?Allergic/Immunologic: Positive for environmental allergies. Negative for immunocompromised state.  ?     Claritin D helps  ?Neurological:  Negative for dizziness, syncope, light-headedness and headaches.  ?Hematological:  Negative for adenopathy. Does not bruise/bleed easily.  ?Psychiatric/Behavioral:  Negative for dysphoric mood. The patient is not nervous/anxious.   ?     Sleep is variable--no daytime sleepiness  ? ?   ?Objective:  ? Physical Exam ?Constitutional:   ?   Appearance: Normal appearance.  ?HENT:  ?   Mouth/Throat:  ?   Pharynx: No oropharyngeal exudate or posterior oropharyngeal erythema.  ?Eyes:  ?   Conjunctiva/sclera: Conjunctivae normal.  ?   Pupils: Pupils are equal, round, and reactive to light.  ?Cardiovascular:  ?   Rate and Rhythm: Normal rate and regular rhythm.  ?   Heart sounds:  ?  No gallop.  ?   Comments: Palpable pedal pulses but decreased ?Pulmonary:  ?   Effort: Pulmonary effort is normal.  ?   Breath sounds: No wheezing or rales.  ?   Comments: Decreased breath sounds but clear ?Abdominal:  ?   Palpations: Abdomen is soft.  ?   Tenderness: There is no abdominal tenderness.  ?Musculoskeletal:  ?   Cervical back: Neck supple.  ?   Right lower leg: No edema.  ?   Left lower leg: No edema.  ?Lymphadenopathy:  ?   Cervical: No cervical adenopathy.  ?Skin: ?   Findings: No lesion.  ?   Comments: Small non tender cyst on volar left wrist --radial side (not clearly synovial though)  ?Neurological:  ?   General: No focal deficit present.  ?   Mental Status: She is alert and oriented to person, place, and time.  ?Psychiatric:     ?   Mood and Affect: Mood normal.     ?   Behavior: Behavior normal.  ?  ? ? ? ? ?   ?Assessment & Plan:  ? ?

## 2022-01-06 NOTE — Addendum Note (Signed)
Addended by: Carter Kitten on: 01/06/2022 10:46 AM ? ? Modules accepted: Orders ? ?

## 2022-01-06 NOTE — Assessment & Plan Note (Signed)
Fairly healthy ?Colon due again 2029 ?Continue mammograms every 1-2 years till 72 ?Encouraged her to keep up with yearly lung cancer screening--given her history especially ?Exercising regularly ?Flu vaccine in the fall--and probably COVID booster ?

## 2022-01-06 NOTE — Assessment & Plan Note (Signed)
Mild symptoms ?Only uses the albuterol occasionally ?

## 2022-01-06 NOTE — Assessment & Plan Note (Signed)
BP Readings from Last 3 Encounters:  ?01/06/22 140/80  ?09/06/21 124/69  ?12/23/20 136/84  ? ?Controlled with the valsartan 80mg  ?

## 2022-01-07 LAB — COMPREHENSIVE METABOLIC PANEL
ALT: 37 IU/L — ABNORMAL HIGH (ref 0–32)
AST: 36 IU/L (ref 0–40)
Albumin/Globulin Ratio: 1.6 (ref 1.2–2.2)
Albumin: 4.5 g/dL (ref 3.8–4.8)
Alkaline Phosphatase: 93 IU/L (ref 44–121)
BUN/Creatinine Ratio: 19 (ref 12–28)
BUN: 11 mg/dL (ref 8–27)
Bilirubin Total: 0.5 mg/dL (ref 0.0–1.2)
CO2: 25 mmol/L (ref 20–29)
Calcium: 9.2 mg/dL (ref 8.7–10.3)
Chloride: 102 mmol/L (ref 96–106)
Creatinine, Ser: 0.58 mg/dL (ref 0.57–1.00)
Globulin, Total: 2.8 g/dL (ref 1.5–4.5)
Glucose: 102 mg/dL — ABNORMAL HIGH (ref 70–99)
Potassium: 4.7 mmol/L (ref 3.5–5.2)
Sodium: 141 mmol/L (ref 134–144)
Total Protein: 7.3 g/dL (ref 6.0–8.5)
eGFR: 99 mL/min/{1.73_m2} (ref >59)

## 2022-01-07 LAB — T4, FREE: Free T4: 1.46 ng/dL (ref 0.82–1.77)

## 2022-01-07 LAB — CBC
Hematocrit: 35.2 % (ref 34.0–46.6)
Hemoglobin: 12 g/dL (ref 11.1–15.9)
MCH: 29.9 pg (ref 26.6–33.0)
MCHC: 34.1 g/dL (ref 31.5–35.7)
MCV: 88 fL (ref 79–97)
Platelets: 247 10*3/uL (ref 150–450)
RBC: 4.01 x10E6/uL (ref 3.77–5.28)
RDW: 14.2 % (ref 11.7–15.4)
WBC: 7.5 10*3/uL (ref 3.4–10.8)

## 2022-01-07 LAB — LIPID PANEL
Chol/HDL Ratio: 6.9 ratio — ABNORMAL HIGH (ref 0.0–4.4)
Cholesterol, Total: 256 mg/dL — ABNORMAL HIGH (ref 100–199)
HDL: 37 mg/dL — ABNORMAL LOW (ref >39)
LDL Chol Calc (NIH): 170 mg/dL — ABNORMAL HIGH (ref 0–99)
Triglycerides: 260 mg/dL — ABNORMAL HIGH (ref 0–149)
VLDL Cholesterol Cal: 49 mg/dL — ABNORMAL HIGH (ref 5–40)

## 2022-01-07 LAB — TSH: TSH: 0.543 u[IU]/mL (ref 0.450–4.500)

## 2022-01-09 MED ORDER — ROSUVASTATIN CALCIUM 20 MG PO TABS: 20.0000 mg | ORAL_TABLET | ORAL | 3 refills | Status: DC

## 2022-02-24 ENCOUNTER — Other Ambulatory Visit: Payer: Self-pay | Admitting: Internal Medicine

## 2022-03-04 ENCOUNTER — Other Ambulatory Visit: Payer: Self-pay

## 2022-03-04 DIAGNOSIS — Z87891 Personal history of nicotine dependence: Secondary | ICD-10-CM

## 2022-03-04 DIAGNOSIS — Z122 Encounter for screening for malignant neoplasm of respiratory organs: Secondary | ICD-10-CM

## 2022-03-14 ENCOUNTER — Ambulatory Visit
Admission: RE | Admit: 2022-03-14 | Discharge: 2022-03-14 | Disposition: A | Discharge status: Home / Self Care | Payer: BC Managed Care – PPO | Source: Ambulatory Visit | Attending: Acute Care | Admitting: Acute Care

## 2022-03-14 ENCOUNTER — Other Ambulatory Visit: Payer: Self-pay | Admitting: Internal Medicine

## 2022-03-14 DIAGNOSIS — Z122 Encounter for screening for malignant neoplasm of respiratory organs: Secondary | ICD-10-CM | POA: Insufficient documentation

## 2022-03-14 DIAGNOSIS — Z87891 Personal history of nicotine dependence: Secondary | ICD-10-CM | POA: Insufficient documentation

## 2022-03-16 ENCOUNTER — Other Ambulatory Visit: Payer: Self-pay | Admitting: Acute Care

## 2022-03-16 DIAGNOSIS — Z122 Encounter for screening for malignant neoplasm of respiratory organs: Secondary | ICD-10-CM

## 2022-03-16 DIAGNOSIS — Z87891 Personal history of nicotine dependence: Secondary | ICD-10-CM

## 2022-04-23 ENCOUNTER — Other Ambulatory Visit: Payer: Self-pay | Admitting: Internal Medicine

## 2022-05-25 ENCOUNTER — Ambulatory Visit: Payer: BC Managed Care – PPO | Admitting: Internal Medicine

## 2022-05-25 ENCOUNTER — Encounter: Payer: Self-pay | Admitting: Internal Medicine

## 2022-05-25 DIAGNOSIS — M25512 Pain in left shoulder: Secondary | ICD-10-CM

## 2022-05-25 DIAGNOSIS — M7542 Impingement syndrome of left shoulder: Secondary | ICD-10-CM | POA: Insufficient documentation

## 2022-05-25 NOTE — Assessment & Plan Note (Signed)
No rotator cuff tear Mostly seems like tendonitis (slight bursitis) Will have her try ice if the bursa hurts Topical diclofenac Consider PT evaluation if persists

## 2022-05-25 NOTE — Patient Instructions (Signed)
Please try over the counter diclofenac gel 3-4 times a day. If it is not improving in the next few weeks--let me know

## 2022-05-25 NOTE — Progress Notes (Signed)
Subjective:    Patient ID: Regina Grimes, female    DOB: Jun 22, 1951, 71 y.o.   MRN: [id number]  HPI Here due to left shoulder pain  Having 3 weeks of left shoulder pain--noticed it one morning No injury Does sleep on that side mostly Some baseline pain--axilla and latera Hard to put dishes up into shelves  No clear swelling Tried icy hot---that helps (but short lived)  Current Outpatient Medications on File Prior to Visit  Medication Sig Dispense Refill   albuterol (VENTOLIN HFA) 108 (90 Base) MCG/ACT inhaler INHALE 2 PUFFS INTO THE LUNGS EVERY 6 HOURS AS NEEDED FOR WHEEZING OR SHORTNESS OF BREATH 8.5 g 0   fluocinonide ointment (LIDEX) 0.05 % APPLY TO THE AFFECTED AREA TWICE DAILY AS NEEDED FOR PSORIASIS 30 g 5   fluticasone (FLONASE) 50 MCG/ACT nasal spray Place into the nose.     folic acid (FOLVITE) 1 MG tablet Take 1 mg by mouth as directed. 1 TABLET ONCE A WEEK AS NEEDED WITH METHOTREXATE.     levothyroxine (SYNTHROID) 88 MCG tablet TAKE 1 TABLET(88 MCG) BY MOUTH DAILY 90 tablet 3   methotrexate (RHEUMATREX) 2.5 MG tablet TAKE 6 TABLETS BY MOUTH 1 TIME A WEEK 72 tablet 2   omeprazole (PRILOSEC) 20 MG capsule Take by mouth.     rosuvastatin (CRESTOR) 20 MG tablet Take 1 tablet (20 mg total) by mouth once a week. 13 tablet 3   valsartan (DIOVAN) 80 MG tablet TAKE 1 TABLET BY MOUTH EVERY DAY 90 tablet 1   No current facility-administered medications on file prior to visit.    Allergies  Allergen Reactions   Aspirin     REACTION: stomach burning   Codeine     Upset stomach   Pravastatin Sodium     REACTION: mood changes and snapping at everyone (pt. Is unaware of this allergy)     Past Medical History:  Diagnosis Date   Allergy    seasonal- claritin D PRN   Anemia    past hx many years  ago   Aortic atherosclerosis ([place])    Arthritis    arm   Cancer ([place]) 10/2011   Left Lung cancer with Partial Lobectomy.   Coughing up blood    10/2011, admitted to [place]     GERD (gastroesophageal reflux disease)    Hiatal hernia    Hyperlipidemia    Hypertension    treated for while in hosp. 10/2011, given med. , unsure of name of med., not told to continue    Osteopenia    Personal history of chemotherapy    completed 03-2012   Psoriasis    has used methotrexate since she was 71y.o., when psoriasis flares. Currently methotrexate on hold since 11/05/2011    Recurrent upper respiratory infection (URI)    lung infection, treated /w antibiotic - 10/2011   Thyroid disease     Past Surgical History:  Procedure Laterality Date   abnormal PAP     low grade intrepithelial lesion. Colposcipy okay   APPENDECTOMY     1982- along /w tubal ligation    Benign tumors of left foot  1992/1994   BREAST BIOPSY Right 2013   benign   COLONOSCOPY  04/2001   ESOPHAGOGASTRODUODENOSCOPY  04/2001   FOOT NEUROMA SURGERY     L foot- 1990's   LUNG REMOVAL, PARTIAL  2013   POLYPECTOMY     HPP 2012   THYROIDECTOMY N/A 09/20/2016   Procedure: THYROIDECTOMY;  Surgeon: Clyde Canterbury,  MD;  Location: [facility];  Service: ENT;  Laterality: N/A;   TUBAL LIGATION  1982   UPPER GASTROINTESTINAL ENDOSCOPY     VD     x 2   VIDEO BRONCHOSCOPY  11/16/2011   Procedure: VIDEO BRONCHOSCOPY WITH FLUORO;  Surgeon: Leanna Sato. Elsworth Soho, MD;  Location: [facility];  Service: Cardiopulmonary;  Laterality: Bilateral;   VIDEO BRONCHOSCOPY  12/21/2011   Procedure: VIDEO BRONCHOSCOPY;  Surgeon: Gaye Pollack, MD;  Location: [facility];  Service: Thoracic;  Laterality: N/A;    Family History  Problem Relation Age of Onset   Coronary artery disease Mother    Heart disease Mother        atrial fibrillation   Lung cancer Father    Lung cancer Brother    Breast cancer Paternal Aunt 79   Cirrhosis Maternal Grandmother    Anesthesia problems Neg Hx    Colon cancer Neg Hx    Colon polyps Neg Hx    Esophageal cancer Neg Hx    Stomach cancer Neg Hx    Rectal cancer Neg Hx     Social History    Socioeconomic History   Marital status: Married    Spouse name: Not on file   Number of children: 2   Years of education: Not on file   Highest education level: Not on file  Occupational History   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  Tobacco Use   Smoking status: Former    Packs/day: 1.00    Years: 44.00    Total pack years: 44.00    Types: Cigarettes    Quit date: 06/25/2010    Years since quitting: 11.9    Passive exposure: Past   Smokeless tobacco: Never  Vaping Use   Vaping Use: Never used  Substance and Sexual Activity   Alcohol use: Yes    Comment: OCC   Drug use: No   Sexual activity: Not on file  Other Topics Concern   Not on file  Social History Narrative   1 son--died   1 daughter       Has living will   Husband and then daughter would be HC power of attorney   Would accept resuscitation   No tube feeds if cognitively unaware   Social Determinants of Health   Financial Resource Strain: Not on file  Food Insecurity: Not on file  Transportation Needs: Not on file  Physical Activity: Not on file  Stress: Not on file  Social Connections: Not on file  Intimate Partner Violence: Not on file   Review of Systems No fever No redness     Objective:   Physical Exam Constitutional:      Appearance: Normal appearance.  Musculoskeletal:     Comments: Slight tenderness over bursa and bicipital tendon Fairly full active abduction Does have pain with external rotation--but moves moderately Full internal rotation and passive abduction  Neurological:     Mental Status: She is alert.            Assessment & Plan:

## 2022-06-21 ENCOUNTER — Encounter: Payer: Self-pay | Admitting: Internal Medicine

## 2022-06-23 ENCOUNTER — Other Ambulatory Visit: Payer: Self-pay | Admitting: Internal Medicine

## 2022-07-17 ENCOUNTER — Other Ambulatory Visit: Payer: Self-pay | Admitting: Internal Medicine

## 2022-09-20 ENCOUNTER — Other Ambulatory Visit: Payer: Self-pay | Admitting: Internal Medicine

## 2022-10-24 ENCOUNTER — Ambulatory Visit (INDEPENDENT_AMBULATORY_CARE_PROVIDER_SITE_OTHER): Payer: Medicare HMO | Admitting: Internal Medicine

## 2022-10-24 ENCOUNTER — Encounter: Payer: Self-pay | Admitting: Internal Medicine

## 2022-10-24 VITALS — BP 138/88 | HR 64 | Temp 97.6°F | Ht 65.0 in | Wt 141.0 lb

## 2022-10-24 DIAGNOSIS — M7542 Impingement syndrome of left shoulder: Secondary | ICD-10-CM

## 2022-10-24 NOTE — Progress Notes (Signed)
Subjective:    Patient ID: Regina Grimes, female    DOB: 08-Sep-1951, 72 y.o.   MRN: [id number]  HPI Here due to left shoulder pain  Never went away Does use the diclofenac gel--helps a little Now seems to be moving down her arm more Especially bad if carrying groceries  Tylenol or aspirin---seems to help some  Current Outpatient Medications on File Prior to Visit  Medication Sig Dispense Refill   albuterol (VENTOLIN HFA) 108 (90 Base) MCG/ACT inhaler INHALE 2 PUFFS INTO THE LUNGS EVERY 6 HOURS AS NEEDED FOR WHEEZING OR SHORTNESS OF BREATH 8.5 g 0   fluocinonide ointment (LIDEX) 0.05 % APPLY TO THE AFFECTED AREA TWICE DAILY AS NEEDED FOR PSORIASIS 30 g 5   fluticasone (FLONASE) 50 MCG/ACT nasal spray Place into the nose.     folic acid (FOLVITE) 1 MG tablet Take 1 mg by mouth as directed. 1 TABLET ONCE A WEEK AS NEEDED WITH METHOTREXATE.     levothyroxine (SYNTHROID) 88 MCG tablet TAKE 1 TABLET(88 MCG) BY MOUTH DAILY 90 tablet 3   methotrexate (RHEUMATREX) 2.5 MG tablet TAKE 6 TABLETS BY MOUTH 1 TIME A WEEK 72 tablet 2   omeprazole (PRILOSEC) 20 MG capsule Take by mouth.     rosuvastatin (CRESTOR) 20 MG tablet Take 1 tablet (20 mg total) by mouth once a week. 13 tablet 3   valsartan (DIOVAN) 80 MG tablet TAKE 1 TABLET BY MOUTH EVERY DAY 90 tablet 1   No current facility-administered medications on file prior to visit.    Allergies  Allergen Reactions   Aspirin     REACTION: stomach burning   Codeine     Upset stomach   Pravastatin Sodium     REACTION: mood changes and snapping at everyone (pt. Is unaware of this allergy)     Past Medical History:  Diagnosis Date   Allergy    seasonal- claritin D PRN   Anemia    past hx many years  ago   Aortic atherosclerosis ([place])    Arthritis    arm   Cancer ([place]) 10/2011   Left Lung cancer with Partial Lobectomy.   Coughing up blood    10/2011, admitted to [place]    GERD (gastroesophageal reflux disease)    Hiatal hernia     Hyperlipidemia    Hypertension    treated for while in hosp. 10/2011, given med. , unsure of name of med., not told to continue    Osteopenia    Personal history of chemotherapy    completed 03-2012   Psoriasis    has used methotrexate since she was 72y.o., when psoriasis flares. Currently methotrexate on hold since 11/05/2011    Recurrent upper respiratory infection (URI)    lung infection, treated /w antibiotic - 10/2011   Thyroid disease     Past Surgical History:  Procedure Laterality Date   abnormal PAP     low grade intrepithelial lesion. Colposcipy okay   APPENDECTOMY     1982- along /w tubal ligation    Benign tumors of left foot  1992/1994   BREAST BIOPSY Right 2013   benign   COLONOSCOPY  04/2001   ESOPHAGOGASTRODUODENOSCOPY  04/2001   FOOT NEUROMA SURGERY     L foot- 1990's   LUNG REMOVAL, PARTIAL  2013   POLYPECTOMY     HPP 2012   THYROIDECTOMY N/A 09/20/2016   Procedure: THYROIDECTOMY;  Surgeon: Clyde Canterbury, MD;  Location: [facility];  Service: ENT;  Laterality:  N/A;   TUBAL LIGATION  1982   UPPER GASTROINTESTINAL ENDOSCOPY     VD     x 2   VIDEO BRONCHOSCOPY  11/16/2011   Procedure: VIDEO BRONCHOSCOPY WITH FLUORO;  Surgeon: Leanna Sato. Elsworth Soho, MD;  Location: [facility];  Service: Cardiopulmonary;  Laterality: Bilateral;   VIDEO BRONCHOSCOPY  12/21/2011   Procedure: VIDEO BRONCHOSCOPY;  Surgeon: Gaye Pollack, MD;  Location: [facility];  Service: Thoracic;  Laterality: N/A;    Family History  Problem Relation Age of Onset   Coronary artery disease Mother    Heart disease Mother        atrial fibrillation   Lung cancer Father    Lung cancer Brother    Breast cancer Paternal Aunt 33   Cirrhosis Maternal Grandmother    Anesthesia problems Neg Hx    Colon cancer Neg Hx    Colon polyps Neg Hx    Esophageal cancer Neg Hx    Stomach cancer Neg Hx    Rectal cancer Neg Hx     Social History   Socioeconomic History   Marital status: Married    Spouse name: Not  on file   Number of children: 2   Years of education: Not on file   Highest education level: Not on file  Occupational History   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  Tobacco Use   Smoking status: Former    Packs/day: 1.00    Years: 44.00    Total pack years: 44.00    Types: Cigarettes    Quit date: 06/25/2010    Years since quitting: 12.3    Passive exposure: Past   Smokeless tobacco: Never  Vaping Use   Vaping Use: Never used  Substance and Sexual Activity   Alcohol use: Yes    Comment: OCC   Drug use: No   Sexual activity: Not on file  Other Topics Concern   Not on file  Social History Narrative   1 son--died   1 daughter       Has living will   Husband and then daughter would be HC power of attorney   Would accept resuscitation   No tube feeds if cognitively unaware   Social Determinants of Health   Financial Resource Strain: Not on file  Food Insecurity: Not on file  Transportation Needs: Not on file  Physical Activity: Not on file  Stress: Not on file  Social Connections: Not on file  Intimate Partner Violence: Not on file   Review of Systems Sleep is affected some--the diclofenac helps for a while (then has to sleep on right side) No other joint issues     Objective:   Physical Exam Constitutional:      Appearance: Normal appearance.  Musculoskeletal:     Comments: Now has restricted abduction and external rotation Internal rotation fairy full No localized tenderness  Neurological:     Mental Status: She is alert.            Assessment & Plan:

## 2022-10-24 NOTE — Assessment & Plan Note (Signed)
Worsening symptoms Will go ahead and refer to ortho Continue tylenol/topical diclofenac for now

## 2022-11-01 DIAGNOSIS — M7542 Impingement syndrome of left shoulder: Secondary | ICD-10-CM | POA: Diagnosis not present

## 2022-12-29 ENCOUNTER — Other Ambulatory Visit: Payer: Self-pay | Admitting: Internal Medicine

## 2022-12-30 ENCOUNTER — Other Ambulatory Visit: Payer: Self-pay | Admitting: Internal Medicine

## 2023-01-04 DIAGNOSIS — H25813 Combined forms of age-related cataract, bilateral: Secondary | ICD-10-CM | POA: Diagnosis not present

## 2023-01-04 DIAGNOSIS — H524 Presbyopia: Secondary | ICD-10-CM | POA: Diagnosis not present

## 2023-01-09 ENCOUNTER — Other Ambulatory Visit: Payer: Self-pay | Admitting: Internal Medicine

## 2023-01-29 ENCOUNTER — Telehealth: Payer: Self-pay

## 2023-01-29 ENCOUNTER — Ambulatory Visit: Payer: Medicare HMO | Admitting: Internal Medicine

## 2023-01-29 NOTE — Telephone Encounter (Signed)
Called and left a message on verified VM to make sure pt was okay since she missed her appointment today.

## 2023-01-30 ENCOUNTER — Ambulatory Visit (INDEPENDENT_AMBULATORY_CARE_PROVIDER_SITE_OTHER): Payer: Medicare HMO | Admitting: Internal Medicine

## 2023-01-30 ENCOUNTER — Encounter: Payer: Self-pay | Admitting: Internal Medicine

## 2023-01-30 VITALS — BP 130/80 | HR 78 | Temp 97.9°F | Ht 64.5 in | Wt 145.0 lb

## 2023-01-30 DIAGNOSIS — Z0000 Encounter for general adult medical examination without abnormal findings: Secondary | ICD-10-CM | POA: Diagnosis not present

## 2023-01-30 DIAGNOSIS — J4489 Other specified chronic obstructive pulmonary disease: Secondary | ICD-10-CM | POA: Diagnosis not present

## 2023-01-30 DIAGNOSIS — Z85118 Personal history of other malignant neoplasm of bronchus and lung: Secondary | ICD-10-CM | POA: Diagnosis not present

## 2023-01-30 DIAGNOSIS — I700 Atherosclerosis of aorta: Secondary | ICD-10-CM | POA: Diagnosis not present

## 2023-01-30 DIAGNOSIS — E890 Postprocedural hypothyroidism: Secondary | ICD-10-CM

## 2023-01-30 DIAGNOSIS — J439 Emphysema, unspecified: Secondary | ICD-10-CM | POA: Diagnosis not present

## 2023-01-30 DIAGNOSIS — L409 Psoriasis, unspecified: Secondary | ICD-10-CM

## 2023-01-30 DIAGNOSIS — I10 Essential (primary) hypertension: Secondary | ICD-10-CM

## 2023-01-30 LAB — LIPID PANEL
Cholesterol: 268 mg/dL — ABNORMAL HIGH (ref 0–200)
HDL: 42.1 mg/dL (ref >39.00)
NonHDL: 225.74
Total CHOL/HDL Ratio: 6
Triglycerides: 267 mg/dL — ABNORMAL HIGH (ref 0.0–149.0)
VLDL: 53.4 mg/dL — ABNORMAL HIGH (ref 0.0–40.0)

## 2023-01-30 LAB — COMPREHENSIVE METABOLIC PANEL
ALT: 13 U/L (ref 0–35)
AST: 21 U/L (ref 0–37)
Albumin: 4.3 g/dL (ref 3.5–5.2)
Alkaline Phosphatase: 66 U/L (ref 39–117)
BUN: 16 mg/dL (ref 6–23)
CO2: 28 mEq/L (ref 19–32)
Calcium: 9.3 mg/dL (ref 8.4–10.5)
Chloride: 103 mEq/L (ref 96–112)
Creatinine, Ser: 0.72 mg/dL (ref 0.40–1.20)
GFR: 85.14 mL/min (ref >60.00)
Glucose, Bld: 96 mg/dL (ref 70–99)
Potassium: 4.4 mEq/L (ref 3.5–5.1)
Sodium: 138 mEq/L (ref 135–145)
Total Bilirubin: 0.5 mg/dL (ref 0.2–1.2)
Total Protein: 7.5 g/dL (ref 6.0–8.3)

## 2023-01-30 LAB — CBC
HCT: 35.6 % — ABNORMAL LOW (ref 36.0–46.0)
Hemoglobin: 12 g/dL (ref 12.0–15.0)
MCHC: 33.6 g/dL (ref 30.0–36.0)
MCV: 88.5 fl (ref 78.0–100.0)
Platelets: 299 10*3/uL (ref 150.0–400.0)
RBC: 4.03 Mil/uL (ref 3.87–5.11)
RDW: 16.2 % — ABNORMAL HIGH (ref 11.5–15.5)
WBC: 6.7 10*3/uL (ref 4.0–10.5)

## 2023-01-30 LAB — T4, FREE: Free T4: 0.99 ng/dL (ref 0.60–1.60)

## 2023-01-30 LAB — TSH: TSH: 0.48 u[IU]/mL (ref 0.35–5.50)

## 2023-01-30 LAB — LDL CHOLESTEROL, DIRECT: Direct LDL: 179 mg/dL

## 2023-01-30 NOTE — Progress Notes (Signed)
Subjective:    Patient ID: Regina Grimes, female    DOB: Jan 04, 1951, 72 y.o.   MRN: [id number]  HPI Here for initial Medicare wellness visit and follow up of chronic health conditions Reviewed advanced directives Reviewed other doctors-- Dr Albesa Seen, Dr Alexandria Lodge No hospitalizations or surgery in the past year Has started back on treadmill--up to 4 miles (at 2.5 MPH)----discussed increasing for interval Vision fair--some trouble with night vision (early cataracts) Hearing is okay No tobacco for 11 years Occasional alcohol ---like on a cruise No falls No depression or anhedonia Independent with instrumental ADLs Mild recall issues--no functional memory issues  No concerns Some cough--but not often Will use the albuterol then--or for tightness Rare SOB  Uses the methotrexate occasionally--when the psoriasis acts up  Known aortic atherosclerosis Takes statin weekly  Uses the omeprazole daily No heartburn or dysphagia on this  No chest pain No palpitations recently No dizziness or synope No edeam Rare headaches  Current Outpatient Medications on File Prior to Visit  Medication Sig Dispense Refill   albuterol (VENTOLIN HFA) 108 (90 Base) MCG/ACT inhaler INHALE 2 PUFFS INTO THE LUNGS EVERY 6 HOURS AS NEEDED FOR WHEEZING OR SHORTNESS OF BREATH 8.5 g 0   fluocinonide ointment (LIDEX) 0.05 % APPLY TOPICALLY TO THE AFFECTED AREA TWICE DAILY AS NEEDED FOR PSORIASIS 30 g 5   fluticasone (FLONASE) 50 MCG/ACT nasal spray Place into the nose.     folic acid (FOLVITE) 1 MG tablet Take 1 mg by mouth as directed. 1 TABLET ONCE A WEEK AS NEEDED WITH METHOTREXATE.     levothyroxine (SYNTHROID) 88 MCG tablet TAKE 1 TABLET(88 MCG) BY MOUTH DAILY 90 tablet 3   methotrexate (RHEUMATREX) 2.5 MG tablet TAKE 6 TABLETS BY MOUTH 1 TIME A WEEK 72 tablet 2   omeprazole (PRILOSEC) 20 MG capsule Take by mouth.     rosuvastatin (CRESTOR) 20 MG tablet TAKE 1 TABLET(20 MG) BY MOUTH 1 TIME A  WEEK 13 tablet 3   valsartan (DIOVAN) 80 MG tablet TAKE 1 TABLET BY MOUTH EVERY DAY 90 tablet 0   No current facility-administered medications on file prior to visit.    Allergies  Allergen Reactions   Aspirin     REACTION: stomach burning   Codeine     Upset stomach   Pravastatin Sodium     REACTION: mood changes and snapping at everyone (pt. Is unaware of this allergy)     Past Medical History:  Diagnosis Date   Allergy    seasonal- claritin D PRN   Anemia    past hx many years  ago   Aortic atherosclerosis (HCC)    Arthritis    arm   Cancer (HCC) 10/2011   Left Lung cancer with Partial Lobectomy.   Coughing up blood    10/2011, admitted to [facility]    GERD (gastroesophageal reflux disease)    Hiatal hernia    Hyperlipidemia    Hypertension    treated for while in hosp. 10/2011, given med. , unsure of name of med., not told to continue    Osteopenia    Personal history of chemotherapy    completed 03-2012   Psoriasis    has used methotrexate since she was 72y.o., when psoriasis flares. Currently methotrexate on hold since 11/05/2011    Recurrent upper respiratory infection (URI)    lung infection, treated /w antibiotic - 10/2011   Thyroid disease     Past Surgical History:  Procedure Laterality Date   abnormal  PAP     low grade intrepithelial lesion. Colposcipy okay   APPENDECTOMY     1982- along /w tubal ligation    Benign tumors of left foot  1992/1994   BREAST BIOPSY Right 2013   benign   COLONOSCOPY  04/2001   ESOPHAGOGASTRODUODENOSCOPY  04/2001   FOOT NEUROMA SURGERY     L foot- 1990's   LUNG REMOVAL, PARTIAL  2013   POLYPECTOMY     HPP 2012   THYROIDECTOMY N/A 09/20/2016   Procedure: THYROIDECTOMY;  Surgeon: Geanie Logan, MD;  Location: [facility];  Service: ENT;  Laterality: N/A;   TUBAL LIGATION  1982   UPPER GASTROINTESTINAL ENDOSCOPY     VD     x 2   VIDEO BRONCHOSCOPY  11/16/2011   Procedure: VIDEO BRONCHOSCOPY WITH FLUORO;  Surgeon: Comer Locket.  Vassie Loll, MD;  Location: [facility];  Service: Cardiopulmonary;  Laterality: Bilateral;   VIDEO BRONCHOSCOPY  12/21/2011   Procedure: VIDEO BRONCHOSCOPY;  Surgeon: Alleen Borne, MD;  Location: [facility];  Service: Thoracic;  Laterality: N/A;    Family History  Problem Relation Age of Onset   Coronary artery disease Mother    Heart disease Mother        atrial fibrillation   Lung cancer Father    Lung cancer Brother    Breast cancer Paternal Aunt 65   Cirrhosis Maternal Grandmother    Anesthesia problems Neg Hx    Colon cancer Neg Hx    Colon polyps Neg Hx    Esophageal cancer Neg Hx    Stomach cancer Neg Hx    Rectal cancer Neg Hx     Social History   Socioeconomic History   Marital status: Married    Spouse name: Not on file   Number of children: 2   Years of education: Not on file   Highest education level: Not on file  Occupational History   Occupation: Lab Scientist, forensic    Comment: Retired  Tobacco Use   Smoking status: Former    Packs/day: 1.00    Years: 44.00    Additional pack years: 0.00    Total pack years: 44.00    Types: Cigarettes    Quit date: 06/25/2010    Years since quitting: 12.6    Passive exposure: Past   Smokeless tobacco: Never  Vaping Use   Vaping Use: Never used  Substance and Sexual Activity   Alcohol use: Yes    Comment: OCC   Drug use: No   Sexual activity: Not on file  Other Topics Concern   Not on file  Social History Narrative   1 son--died   1 daughter       Has living will   Husband and then daughter would be HC power of attorney   Would accept resuscitation   No tube feeds if cognitively unaware   Social Determinants of Health   Financial Resource Strain: Not on file  Food Insecurity: Not on file  Transportation Needs: Not on file  Physical Activity: Not on file  Stress: Not on file  Social Connections: Not on file  Intimate Partner Violence: Not on file   Review of Systems Appetite is good Weight up  slightly Sleeps reasonably well--occ restless night Wears seat belt Full dentures---hasn't needed dentures Has a spot on left leg she wants checked---discussed yearly checks No sig back or joint pains---did see Dr Dion Saucier and shot in left shoulder did help Bowels are fine--no blood Voids okay--occ stress  dribbling (wears pad)--discussed Kegel's    Objective:   Physical Exam Constitutional:      Appearance: Normal appearance.  HENT:     Mouth/Throat:     Pharynx: No oropharyngeal exudate or posterior oropharyngeal erythema.  Eyes:     Conjunctiva/sclera: Conjunctivae normal.     Pupils: Pupils are equal, round, and reactive to light.  Cardiovascular:     Rate and Rhythm: Normal rate and regular rhythm.     Pulses: Normal pulses.     Heart sounds:     No gallop.     Comments: Faint systolic murmur ULSB Pulmonary:     Effort: Pulmonary effort is normal.     Breath sounds: No wheezing or rales.     Comments: Slightly decreased breath sounds but clear Abdominal:     Palpations: Abdomen is soft.     Tenderness: There is no abdominal tenderness.  Musculoskeletal:     Cervical back: Neck supple.     Right lower leg: No edema.     Left lower leg: No edema.  Lymphadenopathy:     Cervical: No cervical adenopathy.  Skin:    Findings: No rash.  Neurological:     General: No focal deficit present.     Mental Status: She is alert and oriented to person, place, and time.     Comments: Word naming 11/1 minute Recall 1/3  Psychiatric:        Mood and Affect: Mood normal.        Behavior: Behavior normal.            Assessment & Plan:

## 2023-01-30 NOTE — Progress Notes (Signed)
Hearing Screening   500Hz  1000Hz  2000Hz  4000Hz   Right ear 25 20 20 25   Left ear 25 25 25  0   Vision Screening   Right eye Left eye Both eyes  Without correction 20/25 20/30 20/25   With correction

## 2023-01-30 NOTE — Assessment & Plan Note (Signed)
Seems euthyroid on levothyroxine 88

## 2023-01-30 NOTE — Assessment & Plan Note (Signed)
BP Readings from Last 3 Encounters:  01/30/23 130/80  10/24/22 138/88  05/25/22 124/80   Controlled with valsartan 80 daily Will check labs

## 2023-01-30 NOTE — Assessment & Plan Note (Signed)
Uses the methotrexate 15mg  occasionally

## 2023-01-30 NOTE — Assessment & Plan Note (Signed)
I have personally reviewed the Medicare Annual Wellness questionnaire and have noted 1. The patient's medical and social history 2. Their use of alcohol, tobacco or illicit drugs 3. Their current medications and supplements 4. The patient's functional ability including ADL's, fall risks, home safety risks and hearing or visual             impairment. 5. Diet and physical activities 6. Evidence for depression or mood disorders  The patients weight, height, BMI and visual acuity have been recorded in the chart I have made referrals, counseling and provided education to the patient based review of the above and I have provided the pt with a written personalized care plan for preventive services.  I have provided you with a copy of your personalized plan for preventive services. Please take the time to review along with your updated medication list.  EKG on file Discussed adding resistance exercise to her walking Flu vaccine in the fall--with RSV Discussed getting updated COVID Colon due 2029 She will set up screening mammogram

## 2023-01-30 NOTE — Assessment & Plan Note (Signed)
No recurrence Getting yearly lung cancer screening

## 2023-01-30 NOTE — Assessment & Plan Note (Signed)
Mild now--since stopping smoking Uses the albuterol prn

## 2023-01-30 NOTE — Assessment & Plan Note (Signed)
Is back to the weekly rosuvastatin Discussed trying to increase to 2-3 days a week

## 2023-02-11 ENCOUNTER — Other Ambulatory Visit: Payer: Self-pay | Admitting: Internal Medicine

## 2023-03-16 ENCOUNTER — Ambulatory Visit
Admission: RE | Admit: 2023-03-16 | Discharge: 2023-03-16 | Disposition: A | Discharge status: Home / Self Care | Payer: Medicare HMO | Source: Ambulatory Visit | Attending: Acute Care | Admitting: Acute Care

## 2023-03-16 DIAGNOSIS — Z87891 Personal history of nicotine dependence: Secondary | ICD-10-CM | POA: Diagnosis not present

## 2023-03-16 DIAGNOSIS — Z122 Encounter for screening for malignant neoplasm of respiratory organs: Secondary | ICD-10-CM

## 2023-03-20 ENCOUNTER — Encounter: Payer: Self-pay | Admitting: Urgent Care

## 2023-03-20 ENCOUNTER — Other Ambulatory Visit: Payer: Self-pay

## 2023-03-20 DIAGNOSIS — Z122 Encounter for screening for malignant neoplasm of respiratory organs: Secondary | ICD-10-CM

## 2023-03-20 DIAGNOSIS — Z87891 Personal history of nicotine dependence: Secondary | ICD-10-CM

## 2023-03-21 ENCOUNTER — Other Ambulatory Visit: Payer: Self-pay | Admitting: Internal Medicine

## 2023-03-22 ENCOUNTER — Other Ambulatory Visit: Payer: Self-pay | Admitting: Internal Medicine

## 2023-04-04 ENCOUNTER — Other Ambulatory Visit: Payer: Self-pay | Admitting: Internal Medicine

## 2023-04-04 NOTE — Telephone Encounter (Signed)
Rx sent electronically.  

## 2023-04-10 DIAGNOSIS — L814 Other melanin hyperpigmentation: Secondary | ICD-10-CM | POA: Diagnosis not present

## 2023-04-10 DIAGNOSIS — D2271 Melanocytic nevi of right lower limb, including hip: Secondary | ICD-10-CM | POA: Diagnosis not present

## 2023-04-10 DIAGNOSIS — L821 Other seborrheic keratosis: Secondary | ICD-10-CM | POA: Diagnosis not present

## 2023-04-10 DIAGNOSIS — D485 Neoplasm of uncertain behavior of skin: Secondary | ICD-10-CM | POA: Diagnosis not present

## 2023-04-10 DIAGNOSIS — D2272 Melanocytic nevi of left lower limb, including hip: Secondary | ICD-10-CM | POA: Diagnosis not present

## 2023-04-10 DIAGNOSIS — L570 Actinic keratosis: Secondary | ICD-10-CM | POA: Diagnosis not present

## 2023-05-13 ENCOUNTER — Other Ambulatory Visit: Payer: Self-pay | Admitting: Internal Medicine

## 2023-05-30 ENCOUNTER — Encounter: Payer: Self-pay | Admitting: Internal Medicine

## 2023-06-30 ENCOUNTER — Other Ambulatory Visit: Payer: Self-pay | Admitting: Internal Medicine

## 2023-07-14 ENCOUNTER — Other Ambulatory Visit: Payer: Self-pay | Admitting: Internal Medicine

## 2023-07-16 MED ORDER — ALBUTEROL SULFATE HFA 108 (90 BASE) MCG/ACT IN AERS: 2.0000 | INHALATION_SPRAY | Freq: Four times a day (QID) | RESPIRATORY_TRACT | 0 refills | Status: DC | PRN

## 2023-08-07 ENCOUNTER — Other Ambulatory Visit: Payer: Self-pay | Admitting: Internal Medicine

## 2023-09-01 ENCOUNTER — Other Ambulatory Visit: Payer: Self-pay | Admitting: Internal Medicine

## 2023-10-03 ENCOUNTER — Ambulatory Visit: Payer: Medicare HMO | Admitting: Internal Medicine

## 2023-10-03 ENCOUNTER — Encounter: Payer: Self-pay | Admitting: Internal Medicine

## 2023-10-03 ENCOUNTER — Other Ambulatory Visit: Payer: Self-pay | Admitting: Internal Medicine

## 2023-10-03 VITALS — BP 138/76 | HR 73 | Temp 98.6°F | Ht 64.5 in | Wt 150.0 lb

## 2023-10-03 DIAGNOSIS — N644 Mastodynia: Secondary | ICD-10-CM | POA: Diagnosis not present

## 2023-10-03 DIAGNOSIS — M79622 Pain in left upper arm: Secondary | ICD-10-CM

## 2023-10-03 NOTE — Assessment & Plan Note (Signed)
 Nothing suspicious on exam Due for mammogram--will do as diagnostic  ?musculoskeletal etiology

## 2023-10-03 NOTE — Progress Notes (Signed)
 Subjective:    Patient ID: Regina Grimes, female    DOB: Mar 02, 1951, 73 y.o.   MRN: [id number]  HPI Here for pain on left side---under arm  Started mid December--soreness in left breast and in armpit Got busy and was ignoring it ---but it has persisted Feels asymmetry in left axilla--compared to right No nipple discharge  Current Outpatient Medications on File Prior to Visit  Medication Sig Dispense Refill   albuterol  (VENTOLIN  HFA) 108 (90 Base) MCG/ACT inhaler INHALE 2 PUFFS INTO THE LUNGS EVERY 6 HOURS AS NEEDED FOR WHEEZING OR SHORTNESS OF BREATH 8.5 g 0   fluocinonide  ointment (LIDEX ) 0.05 % APPLY TOPICALLY TO THE AFFECTED AREA TWICE DAILY AS NEEDED FOR PSORIASIS 30 g 5   folic acid  (FOLVITE ) 1 MG tablet Take 1 mg by mouth as directed. 1 TABLET ONCE A WEEK AS NEEDED WITH METHOTREXATE .     levothyroxine  (SYNTHROID ) 88 MCG tablet TAKE 1 TABLET(88 MCG) BY MOUTH DAILY 90 tablet 3   methotrexate  (RHEUMATREX) 2.5 MG tablet TAKE 6 TABLETS BY MOUTH 1 TIME A WEEK 72 tablet 1   omeprazole (PRILOSEC) 20 MG capsule Take by mouth.     rosuvastatin  (CRESTOR ) 20 MG tablet TAKE 1 TABLET(20 MG) BY MOUTH 1 TIME A WEEK 13 tablet 3   valsartan  (DIOVAN ) 80 MG tablet TAKE 1 TABLET BY MOUTH EVERY DAY 90 tablet 3   No current facility-administered medications on file prior to visit.    Allergies  Allergen Reactions   Aspirin     REACTION: stomach burning   Codeine     Upset stomach   Pravastatin Sodium     REACTION: mood changes and snapping at everyone (pt. Is unaware of this allergy)     Past Medical History:  Diagnosis Date   Allergy    seasonal- claritin D PRN   Anemia    past hx many years  ago   Aortic atherosclerosis (HCC)    Arthritis    arm   Cancer (HCC) 10/2011   Left Lung cancer with Partial Lobectomy.   Coughing up blood    10/2011, admitted to [facility]    GERD (gastroesophageal reflux disease)    Hiatal hernia    Hyperlipidemia    Hypertension    treated for while  in hosp. 10/2011, given med. , unsure of name of med., not told to continue    Osteopenia    Personal history of chemotherapy    completed 03-2012   Psoriasis    has used methotrexate  since she was 73y.o., when psoriasis flares. Currently methotrexate  on hold since 11/05/2011    Recurrent upper respiratory infection (URI)    lung infection, treated /w antibiotic - 10/2011   Thyroid  disease     Past Surgical History:  Procedure Laterality Date   abnormal PAP     low grade intrepithelial lesion. Colposcipy okay   APPENDECTOMY     1982- along /w tubal ligation    Benign tumors of left foot  1992/1994   BREAST BIOPSY Right 2013   benign   COLONOSCOPY  04/2001   ESOPHAGOGASTRODUODENOSCOPY  04/2001   FOOT NEUROMA SURGERY     L foot- 1990's   LUNG REMOVAL, PARTIAL  2013   POLYPECTOMY     HPP 2012   THYROIDECTOMY N/A 09/20/2016   Procedure: THYROIDECTOMY;  Surgeon: Deward Dolly, MD;  Location: [facility];  Service: ENT;  Laterality: N/A;   TUBAL LIGATION  1982   UPPER GASTROINTESTINAL ENDOSCOPY  VD     x 2   VIDEO BRONCHOSCOPY  11/16/2011   Procedure: VIDEO BRONCHOSCOPY WITH FLUORO;  Surgeon: Harden GAILS. Jude, MD;  Location: [facility];  Service: Cardiopulmonary;  Laterality: Bilateral;   VIDEO BRONCHOSCOPY  12/21/2011   Procedure: VIDEO BRONCHOSCOPY;  Surgeon: Dorise MARLA Fellers, MD;  Location: [facility];  Service: Thoracic;  Laterality: N/A;    Family History  Problem Relation Age of Onset   Coronary artery disease Mother    Heart disease Mother        atrial fibrillation   Lung cancer Father    Lung cancer Brother    Breast cancer Paternal Aunt 67   Cirrhosis Maternal Grandmother    Anesthesia problems Neg Hx    Colon cancer Neg Hx    Colon polyps Neg Hx    Esophageal cancer Neg Hx    Stomach cancer Neg Hx    Rectal cancer Neg Hx     Social History   Socioeconomic History   Marital status: Married    Spouse name: Not on file   Number of children: 2   Years of education:  Not on file   Highest education level: Not on file  Occupational History   Occupation: Lab Scientist, forensic    Comment: Retired  Tobacco Use   Smoking status: Former    Current packs/day: 0.00    Average packs/day: 1 pack/day for 44.0 years (44.0 ttl pk-yrs)    Types: Cigarettes    Start date: 06/25/1966    Quit date: 06/25/2010    Years since quitting: 13.2    Passive exposure: Past   Smokeless tobacco: Never  Vaping Use   Vaping status: Never Used  Substance and Sexual Activity   Alcohol use: Yes    Comment: OCC   Drug use: No   Sexual activity: Not on file  Other Topics Concern   Not on file  Social History Narrative   1 son--died   1 daughter       Has living will   Husband and then daughter would be HC power of attorney   Would accept resuscitation   No tube feeds if cognitively unaware   Social Drivers of Corporate Investment Banker Strain: Not on file  Food Insecurity: Not on file  Transportation Needs: Not on file  Physical Activity: Not on file  Stress: Not on file  Social Connections: Not on file  Intimate Partner Violence: Not on file   Review of Systems No cough or SOB No fever    Objective:   Physical Exam Genitourinary:    Comments: No axillary masses Some fairly normal cystic changes in lateral left breast No sig mass or tenderness           Assessment & Plan:

## 2023-10-08 ENCOUNTER — Ambulatory Visit
Admission: RE | Admit: 2023-10-08 | Discharge: 2023-10-08 | Disposition: A | Discharge status: Home / Self Care | Payer: Medicare HMO | Source: Ambulatory Visit | Attending: Internal Medicine | Admitting: Internal Medicine

## 2023-10-08 DIAGNOSIS — N644 Mastodynia: Secondary | ICD-10-CM | POA: Diagnosis not present

## 2023-10-08 DIAGNOSIS — M79622 Pain in left upper arm: Secondary | ICD-10-CM | POA: Insufficient documentation

## 2023-10-08 DIAGNOSIS — R92323 Mammographic fibroglandular density, bilateral breasts: Secondary | ICD-10-CM | POA: Diagnosis not present

## 2023-10-09 ENCOUNTER — Encounter: Payer: Self-pay | Admitting: Internal Medicine

## 2023-10-15 DIAGNOSIS — H25813 Combined forms of age-related cataract, bilateral: Secondary | ICD-10-CM | POA: Diagnosis not present

## 2023-11-01 DIAGNOSIS — H43813 Vitreous degeneration, bilateral: Secondary | ICD-10-CM | POA: Diagnosis not present

## 2023-11-01 DIAGNOSIS — H2513 Age-related nuclear cataract, bilateral: Secondary | ICD-10-CM | POA: Diagnosis not present

## 2023-11-01 DIAGNOSIS — H31003 Unspecified chorioretinal scars, bilateral: Secondary | ICD-10-CM | POA: Diagnosis not present

## 2023-11-24 ENCOUNTER — Telehealth: Admitting: Family Medicine

## 2023-11-24 ENCOUNTER — Encounter

## 2023-11-24 ENCOUNTER — Telehealth

## 2023-11-24 DIAGNOSIS — R062 Wheezing: Secondary | ICD-10-CM | POA: Diagnosis not present

## 2023-11-24 DIAGNOSIS — U071 COVID-19: Secondary | ICD-10-CM | POA: Diagnosis not present

## 2023-11-24 DIAGNOSIS — R059 Cough, unspecified: Secondary | ICD-10-CM

## 2023-11-24 MED ORDER — NIRMATRELVIR/RITONAVIR (PAXLOVID)TABLET: 3.0000 | ORAL_TABLET | Freq: Two times a day (BID) | ORAL | 0 refills | Status: AC

## 2023-11-24 MED ORDER — PREDNISONE 20 MG PO TABS: 20.0000 mg | ORAL_TABLET | Freq: Two times a day (BID) | ORAL | 0 refills | Status: AC

## 2023-11-24 MED ORDER — BENZONATATE 100 MG PO CAPS: 100.0000 mg | ORAL_CAPSULE | Freq: Three times a day (TID) | ORAL | 0 refills | Status: AC | PRN

## 2023-11-24 NOTE — Progress Notes (Signed)
 Virtual Visit Consent   Regina Grimes, you are scheduled for a virtual visit with a [facility] provider today. Just as with appointments in the office, your consent must be obtained to participate. Your consent will be active for this visit and any virtual visit you may have with one of our providers in the next 365 days. If you have a MyChart account, a copy of this consent can be sent to you electronically.  As this is a virtual visit, video technology does not allow for your provider to perform a traditional examination. This may limit your provider's ability to fully assess your condition. If your provider identifies any concerns that need to be evaluated in person or the need to arrange testing (such as labs, EKG, etc.), we will make arrangements to do so. Although advances in technology are sophisticated, we cannot ensure that it will always work on either your end or our end. If the connection with a video visit is poor, the visit may have to be switched to a telephone visit. With either a video or telephone visit, we are not always able to ensure that we have a secure connection.  By engaging in this virtual visit, you consent to the provision of healthcare and authorize for your insurance to be billed (if applicable) for the services provided during this visit. Depending on your insurance coverage, you may receive a charge related to this service.  I need to obtain your verbal consent now. Are you willing to proceed with your visit today? Regina Grimes has provided verbal consent on 11/24/2023 for a virtual visit (video or telephone). Georgana Curio, FNP  Date: 11/24/2023 4:24 PM   Virtual Visit via Video Note   I, Georgana Curio, connected with  Regina Grimes  ([id number], 04-Dec-1950) on 11/24/23 at  4:30 PM EST by a video-enabled telemedicine application and verified that I am speaking with the correct person using two identifiers.  Location: Patient: Virtual Visit Location Patient:  Home Provider: Virtual Visit Location Provider: Home Office   I discussed the limitations of evaluation and management by telemedicine and the availability of in person appointments. The patient expressed understanding and agreed to proceed.    History of Present Illness: Regina Grimes is a 73 y.o. who identifies as a female who was assigned female at birth, and is being seen today for positive covid testing with fever, chills, headache, body aches. Cough. Requests paxlovid and prednisone. Her husband has it also. She is wheezing and has albuterol inhaler to use at home. Delsym has been effective for cough. She is not in distress. 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 Is unaware of this allergy)    Medications:  Current Outpatient Medications:    nirmatrelvir/ritonavir (  PAXLOVID) 20 x 150 MG & 10 x 100MG  TABS, Take 3 tablets by mouth 2 (two) times daily for 5 days. (Take nirmatrelvir 150 mg two tablets twice daily for 5 days and ritonavir 100 mg one tablet twice daily for 5 days) Patient GFR is 64, Disp: 30 tablet, Rfl: 0   predniSONE (DELTASONE) 20 MG tablet, Take 1 tablet (20 mg total) by mouth 2 (two) times daily with a meal for 5 days., Disp: 10 tablet, Rfl: 0   albuterol (VENTOLIN HFA) 108 (90  Base) MCG/ACT inhaler, INHALE 2 PUFFS INTO THE LUNGS EVERY 6 HOURS AS NEEDED FOR WHEEZING OR SHORTNESS OF BREATH, Disp: 8.5 g, Rfl: 0   benzonatate (TESSALON) 100 MG capsule, Take 1 capsule (100 mg total) by mouth 3 (three) times daily as needed for up to 7 days., Disp: 21 capsule, Rfl: 0   fluocinonide ointment (LIDEX) 0.05 %, APPLY TOPICALLY TO THE AFFECTED AREA TWICE DAILY AS NEEDED FOR PSORIASIS, Disp: 30 g, Rfl: 5   folic acid (FOLVITE) 1 MG tablet, Take 1 mg by mouth as directed. 1 TABLET ONCE A WEEK AS NEEDED WITH METHOTREXATE., Disp: , Rfl:    levothyroxine (SYNTHROID) 88 MCG tablet, TAKE 1 TABLET(88 MCG) BY MOUTH DAILY, Disp: 90 tablet, Rfl: 3   methotrexate (RHEUMATREX) 2.5 MG tablet, TAKE 6 TABLETS BY MOUTH 1 TIME A WEEK, Disp: 72 tablet, Rfl: 1   omeprazole (PRILOSEC) 20 MG capsule, Take by mouth., Disp: , Rfl:    rosuvastatin (CRESTOR) 20 MG tablet, TAKE 1 TABLET(20 MG) BY MOUTH 1 TIME A WEEK, Disp: 13 tablet, Rfl: 3   valsartan (DIOVAN) 80 MG tablet, TAKE 1 TABLET BY MOUTH EVERY DAY, Disp: 90 tablet, Rfl: 3  Observations/Objective: Patient is well-developed, well-nourished in no acute distress.  Resting comfortably  at home.  Head is normocephalic, atraumatic.  No labored breathing.  Speech is clear and coherent with logical content.  Patient is alert and oriented at baseline.    Assessment and Plan: 1. COVID (Primary)  2. Wheezing  Increase fluids, humidifier at night, tylenol or ibuprofen as directed, UC if sx worsen. MVI with extra Vit D and Zinc. Medication use and side effects discussed. Quarantine- see pt education.   Follow Up Instructions: I discussed the assessment and treatment plan with the patient. The patient was provided an opportunity to ask questions and all were answered. The patient agreed with the plan and demonstrated an understanding of the instructions.  A copy of instructions were sent to the patient via MyChart unless otherwise noted below.     The  patient was advised to call back or seek an in-person evaluation if the symptoms worsen or if the condition fails to improve as anticipated.    Georgana Curio, FNP

## 2023-11-24 NOTE — Patient Instructions (Signed)

## 2023-11-24 NOTE — Progress Notes (Signed)
 E-Visit for [employer] Virus / COVID Screening  Your current symptoms could be consistent with COVID.  Please complete a Covid test either at home or check with your local pharmacy to see if they provide testing.    You have tested positive for COVID-19, meaning that you were infected with the novel coronavirus and could give the virus to others.  Most people with COVID-19 have mild illness and can recover at home without medical care. Do not leave your home, except to get medical care. Do not visit public areas and do not go to places where you are unable to wear a mask. It is important that you stay home  to take care for yourself and to help protect other people in your home and community.      Isolation Instructions:   You are to isolate at home until you have been fever free for at least 24 hours without a fever-reducing medication, and symptoms have been steadily improving for 24 hours. At that time,  you can end isolation but need to mask for an additional 5 days.  If you must be around other household members who do not have symptoms, you need to make sure that both you and the family members are masking consistently with a high-quality mask.  If you note any worsening of symptoms despite treatment, please seek an in-person evaluation ASAP. If you note any significant shortness of breath or any chest pain, please seek ER evaluation. Please do not delay care!  Go to the nearest hospital ED for assessment if fever/cough/breathlessness are severe or illness seems like a threat to life.    The following symptoms may appear 2-14 days after exposure: Fever Cough Shortness of breath or difficulty breathing Chills Repeated shaking with chills Muscle pain Headache Sore throat New loss of taste or smell Fatigue Congestion or runny nose Nausea or vomiting Diarrhea  You can use medication such as prescription cough medication called Tessalon Perles 100 mg. You may take 1-2 capsules every 8  hours as needed for cough  You may also take acetaminophen (Tylenol) as needed for fever.  HOME CARE Only take medications as instructed by your medical team. Drink plenty of fluids and get plenty of rest. A steam or ultrasonic humidifier can help if you have congestion.  GET HELP RIGHT AWAY IF YOU HAVE EMERGENCY WARNING SIGNS.  Call 911 or proceed to your closest emergency facility if: You develop worsening high fever. Trouble breathing Bluish lips or face Persistent pain or pressure in the chest New confusion Inability to wake or stay awake You cough up blood. Your symptoms become more severe Inability to hold down food or fluids  This list is not all possible symptoms. Contact your medical provider for any symptoms that are severe or concerning to you.   Your e-visit answers were reviewed by a board certified advanced clinical practitioner to complete your personal care plan.  Depending on the condition, your plan could have included both over the counter or prescription medications.  If there is a problem, please reply once you have received a response from your provider.  Your safety is important to [place].  If you have drug allergies check your prescription carefully.    You can use MyChart to ask questions about today's visit, request a non-urgent call back, or ask for a work or school excuse for 24 hours related to this e-Visit. If it has been greater than 24 hours you will need to follow up with your  provider or enter a new e-Visit to address those concerns. You will get an e-mail in the next two days asking about your experience.  I hope that your e-visit has been valuable and will speed your recovery. Thank you for using e-visits.   I have spent 5 minutes in review of e-visit questionnaire, review and updating patient chart, medical decision making and response to patient.   Reed Pandy, PA-C

## 2023-12-03 ENCOUNTER — Ambulatory Visit: Payer: Self-pay | Admitting: Internal Medicine

## 2023-12-03 NOTE — Telephone Encounter (Signed)
 I am glad she could get in

## 2023-12-03 NOTE — Telephone Encounter (Signed)
 Noted.

## 2023-12-03 NOTE — Telephone Encounter (Signed)
 Copied from CRM [id number]. Topic: Clinical - Medical Advice >> Dec 03, 2023  9:09 AM Elizebeth Brooking wrote: Reason for CRM: Patient called in stating she was diagnosed with covid march 1 was prescribed medication and finished on Thursday morning, still has a low grade fever, running nose and still feels very bad. Would like for someone to give her a callback regarding on what she should do.  Chief Complaint: COVID  Symptoms: still having sx, runny nose, congestion, cough, low grade fever, not feeling well Frequency: 11/24/23 Pertinent Negatives: Patient denies SOB Disposition: [] ED /[] Urgent Care (no appt availability in office) / [x] Appointment(In office/virtual)/ []  [facility]/ [] Home Care/ [] Refused Recommended Disposition /[] [facility]/ []  Follow-up with PCP Additional Notes: pt states she had VV on 11/24/23, was given paxlovid and has completed and still not feeling well. Pt states still having low grade fever as well. Has taken Sudafed and ASA this morning with little relief. Advised pt since had VV last, to do in person this time to make sure no infection going on. Pt agreed. Scheduled OV tomorrow at 1400 with Dr. Ermalene Searing. Recommended pt continue taking OTC for some relief and call back if sx get worse before appt.   Reason for Disposition  [1] PERSISTING SYMPTOMS OF COVID-19 AND [2] symptoms WORSE  Answer Assessment - Initial Assessment Questions 1. COVID-19 ONSET: "When did the symptoms of COVID-19 first start?"     11/24/23 2. DIAGNOSIS CONFIRMATION: "How were you diagnosed?" (e.g., COVID-19 oral or nasal viral test; COVID-19 antibody test; doctor visit)     Home test  4. SYMPTOM ONSET: "When did the  sx  start?"     11/24/23 6. RECENT MEDICAL VISIT: "Have you been seen by a healthcare provider (doctor, NP, PA) for these persisting COVID-19 symptoms?" If Yes, ask: "When were you seen?" (e.g., date)     Had VUC 11/24/23 7. COUGH: "Do you have a cough?" If Yes, ask: "How bad is  the cough?"       yes 8. FEVER: "Do you have a fever?" If Yes, ask: "What is your temperature, how was it measured, and when did it start?"     Low grade  9. BREATHING DIFFICULTY: "Are you having any trouble breathing?" If Yes, ask: "How bad is your breathing?" (e.g., mild, moderate, severe)    - MILD: No SOB at rest, mild SOB with walking, speaks normally in sentences, can lie down, no retractions, pulse < 100.    - MODERATE: SOB at rest, SOB with minimal exertion and prefers to sit, cannot lie down flat, speaks in phrases, mild retractions, audible wheezing, pulse 100-120.    - SEVERE: Very SOB at rest, speaks in single words, struggling to breathe, sitting hunched forward, retractions, pulse > 120.       no 10. OTHER SYMPTOMS: "Do you have any other symptoms?"  (e.g., fatigue, headache, muscle pain, weakness)       Runny nose and congestion, cough  Protocols used: Coronavirus (COVID-19) Persisting Symptoms Follow-up Call-[facility]

## 2023-12-04 ENCOUNTER — Encounter: Payer: Self-pay | Admitting: Family Medicine

## 2023-12-04 ENCOUNTER — Ambulatory Visit (INDEPENDENT_AMBULATORY_CARE_PROVIDER_SITE_OTHER): Admitting: Family Medicine

## 2023-12-04 VITALS — BP 130/70 | HR 81 | Temp 99.2°F | Ht 64.5 in | Wt 149.0 lb

## 2023-12-04 DIAGNOSIS — J0100 Acute maxillary sinusitis, unspecified: Secondary | ICD-10-CM

## 2023-12-04 MED ORDER — AMOXICILLIN 500 MG PO CAPS: 1000.0000 mg | ORAL_CAPSULE | Freq: Two times a day (BID) | ORAL | 0 refills | Status: DC

## 2023-12-04 NOTE — Progress Notes (Signed)
 Patient ID: Regina Grimes, female    DOB: Jul 18, 1951, 73 y.o.   MRN: [id number]  This visit was conducted in person.  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 female presenting on 12/04/2023 for Fever (Post Covid.Dx March 1 Finished Paxlovid and Prednisone last Thursday morning), Fatigue (/), Nasal Congestion (/), Chills (/), and Cough   Dx with COVID 3/1  Completed Paxlovid and prednisone. Hx of COPD  She felt initially better last week. Now since then she  started feeling weak., low grade fever.  Runny nose. Coughing some.. yellowish phelgm  Yellow discharge from nose.  Facial maxillary  pressure and pain  Ringing in ears.  She had some wheeze last night  Using albuterol prn.      Relevant past medical, surgical, family and social history reviewed and updated as indicated. Interim medical history since our last visit reviewed. Allergies and medications reviewed and updated. Outpatient Medications Prior to Visit  Medication Sig Dispense Refill   albuterol (VENTOLIN HFA) 108 (90 Base) MCG/ACT inhaler INHALE 2 PUFFS INTO THE LUNGS EVERY 6 HOURS AS NEEDED FOR WHEEZING OR SHORTNESS OF BREATH 8.5 g 0   fluocinonide ointment (LIDEX) 0.05 % APPLY TOPICALLY TO THE AFFECTED AREA TWICE DAILY AS NEEDED FOR PSORIASIS 30 g 5   folic acid (FOLVITE) 1 MG tablet Take 1 mg by mouth as directed. 1 TABLET ONCE A WEEK AS NEEDED WITH METHOTREXATE.     levothyroxine (SYNTHROID) 88 MCG tablet TAKE 1 TABLET(88 MCG) BY MOUTH DAILY 90 tablet 3   methotrexate (RHEUMATREX) 2.5 MG  tablet TAKE 6 TABLETS BY MOUTH 1 TIME A WEEK 72 tablet 1   omeprazole (PRILOSEC) 20 MG capsule Take by mouth.     rosuvastatin (CRESTOR) 20 MG tablet TAKE 1 TABLET(20 MG) BY MOUTH 1 TIME A WEEK 13 tablet 3   valsartan (DIOVAN) 80 MG tablet TAKE 1 TABLET BY MOUTH EVERY DAY 90 tablet 3   No facility-administered medications prior to visit.     Per HPI unless specifically indicated in ROS section below Review of Systems  Constitutional:  Negative for fatigue and fever.  HENT:  Negative for congestion.   Eyes:  Negative for pain.  Respiratory:  Negative for cough and shortness of breath.   Cardiovascular:  Negative for chest pain, palpitations and leg swelling.  Gastrointestinal:  Negative for abdominal pain.  Genitourinary:  Negative for dysuria and vaginal bleeding.  Musculoskeletal:  Negative for back pain.  Neurological:  Negative for syncope, light-headedness and headaches.  Psychiatric/Behavioral:  Negative for dysphoric mood.    Objective:  BP 130/70 (BP Location: Left Arm, Patient Position: Sitting, Cuff Size: Normal)   Pulse 81   Temp 99.2 F (37.3 C) (Temporal)   Ht 5' 4.5" (1.638 m)   Wt 149 lb (67.6 kg)   SpO2 96%   BMI 25.18 kg/m  Wt Readings from Last 3 Encounters:  12/04/23 149 lb (67.6 kg)  10/03/23 150 lb (68 kg)  01/30/23 145 lb (65.8 kg)      Physical Exam Constitutional:      General: She is not in acute distress.    Appearance: Normal appearance. She is well-developed. She is not ill-appearing or toxic-appearing.  HENT:     Head: Normocephalic.     Right Ear: Hearing, tympanic membrane, ear canal and external ear normal. Tympanic membrane is not erythematous, retracted or bulging.     Left Ear: Hearing, tympanic membrane, ear canal and external ear normal. Tympanic membrane is not erythematous, retracted or bulging.     Nose: Mucosal edema present. No rhinorrhea.     Right Turbinates: Swollen.     Left Turbinates: Swollen.     Right Sinus: Maxillary  sinus tenderness present. No frontal sinus tenderness.     Left Sinus: Maxillary sinus tenderness present. No frontal sinus tenderness.     Mouth/Throat:     Pharynx: Uvula midline.  Eyes:     General: Lids are normal. Lids are everted, no foreign bodies appreciated.     Conjunctiva/sclera: Conjunctivae normal.     Pupils: Pupils are equal, round, and reactive to light.  Neck:     Thyroid: No thyroid mass or thyromegaly.     Vascular: No carotid bruit.     Trachea: Trachea normal.  Cardiovascular:     Rate and Rhythm: Normal rate and regular rhythm.     Pulses: Normal pulses.     Heart sounds: Normal heart sounds, S1 normal and S2 normal. No murmur heard.    No friction rub. No gallop.  Pulmonary:     Effort: Pulmonary effort is normal. No tachypnea or respiratory distress.     Breath sounds: Normal breath sounds. No decreased breath sounds, wheezing, rhonchi or rales.  Abdominal:     General: Bowel sounds are normal.     Palpations: Abdomen is soft.     Tenderness: There is no abdominal tenderness.  Musculoskeletal:     Cervical back: Normal range of motion and neck supple.  Skin:    General: Skin is warm and dry.     Findings: No rash.  Neurological:     Mental Status: She is alert.  Psychiatric:        Mood and Affect: Mood is not anxious or depressed.        Speech: Speech normal.        Behavior: Behavior normal. Behavior is cooperative.        Thought Content: Thought content normal.        Judgment: Judgment normal.       Results for orders placed or performed in visit on 01/30/23  Comprehensive metabolic panel   Collection Time: 01/30/23  9:08 AM  Result Value Ref Range   Sodium 138 135 - 145 mEq/L   Potassium 4.4 3.5 - 5.1 mEq/L   Chloride 103 96 - 112 mEq/L   CO2 28 19 - 32 mEq/L   Glucose, Bld 96 70 - 99 mg/dL   BUN 16 6 - 23 mg/dL   Creatinine, Ser 7.82 0.40 - 1.20 mg/dL   Total Bilirubin 0.5 0.2 - 1.2 mg/dL   Alkaline Phosphatase 66 39 - 117 U/L   AST  21 0 - 37 U/L   ALT 13 0 - 35 U/L   Total Protein 7.5 6.0 - 8.3 g/dL   Albumin 4.3 3.5 - 5.2 g/dL  GFR 85.14 >60.00 mL/min   Calcium 9.3 8.4 - 10.5 mg/dL  Lipid panel   Collection Time: 01/30/23  9:08 AM  Result Value Ref Range   Cholesterol 268 (H) 0 - 200 mg/dL   Triglycerides 213.0 (H) 0.0 - 149.0 mg/dL   HDL 86.57 >84.69 mg/dL   VLDL 62.9 (H) 0.0 - 52.8 mg/dL   Total CHOL/HDL Ratio 6    NonHDL 225.74   T4, free   Collection Time: 01/30/23  9:08 AM  Result Value Ref Range   Free T4 0.99 0.60 - 1.60 ng/dL  TSH   Collection Time: 01/30/23  9:08 AM  Result Value Ref Range   TSH 0.48 0.35 - 5.50 uIU/mL  CBC   Collection Time: 01/30/23  9:08 AM  Result Value Ref Range   WBC 6.7 4.0 - 10.5 K/uL   RBC 4.03 3.87 - 5.11 Mil/uL   Platelets 299.0 150.0 - 400.0 K/uL   Hemoglobin 12.0 12.0 - 15.0 g/dL   HCT 41.3 (L) 24.4 - 01.0 %   MCV 88.5 78.0 - 100.0 fl   MCHC 33.6 30.0 - 36.0 g/dL   RDW 27.2 (H) 53.6 - 64.4 %  LDL cholesterol, direct   Collection Time: 01/30/23  9:08 AM  Result Value Ref Range   Direct LDL 179.0 mg/dL    Assessment and Plan  Acute non-recurrent maxillary sinusitis Assessment & Plan: Acute, appears COVID infection has resolved and patient now with low-grade fever late in illness along with sinus pressure and pain, as well as purulent mucus. Recommend course of amoxicillin 500 mg 2 tablets twice daily for 10 days to cover for bacterial infection. Start nasal saline irrigation and Flonase 2 sprays per nostril daily.  No clear current evidence of COPD exacerbation or pneumonia  Return and ER precautions provided.   Other orders -     Amoxicillin; Take 2 capsules (1,000 mg total) by mouth 2 (two) times daily.  Dispense: 40 capsule; Refill: 0    No follow-ups on file.   Kerby Nora, MD

## 2023-12-04 NOTE — Assessment & Plan Note (Signed)
 Acute, appears COVID infection has resolved and patient now with low-grade fever late in illness along with sinus pressure and pain, as well as purulent mucus. Recommend course of amoxicillin 500 mg 2 tablets twice daily for 10 days to cover for bacterial infection. Start nasal saline irrigation and Flonase 2 sprays per nostril daily.  No clear current evidence of COPD exacerbation or pneumonia  Return and ER precautions provided.

## 2023-12-16 ENCOUNTER — Other Ambulatory Visit: Payer: Self-pay | Admitting: Internal Medicine

## 2024-01-16 ENCOUNTER — Other Ambulatory Visit: Payer: Self-pay | Admitting: Internal Medicine

## 2024-02-01 ENCOUNTER — Other Ambulatory Visit: Payer: Self-pay | Admitting: Internal Medicine

## 2024-02-01 ENCOUNTER — Encounter: Payer: Medicare HMO | Admitting: Internal Medicine

## 2024-02-07 DIAGNOSIS — H43813 Vitreous degeneration, bilateral: Secondary | ICD-10-CM | POA: Diagnosis not present

## 2024-02-07 DIAGNOSIS — H2513 Age-related nuclear cataract, bilateral: Secondary | ICD-10-CM | POA: Diagnosis not present

## 2024-02-07 DIAGNOSIS — H31003 Unspecified chorioretinal scars, bilateral: Secondary | ICD-10-CM | POA: Diagnosis not present

## 2024-02-11 ENCOUNTER — Ambulatory Visit

## 2024-02-11 ENCOUNTER — Other Ambulatory Visit: Payer: Self-pay | Admitting: Internal Medicine

## 2024-02-11 VITALS — BP 130/70 | Ht 64.5 in | Wt 145.0 lb

## 2024-02-11 DIAGNOSIS — Z5320 Procedure and treatment not carried out because of patient's decision for unspecified reasons: Secondary | ICD-10-CM

## 2024-02-11 DIAGNOSIS — Z0000 Encounter for general adult medical examination without abnormal findings: Secondary | ICD-10-CM

## 2024-02-11 DIAGNOSIS — Z2821 Immunization not carried out because of patient refusal: Secondary | ICD-10-CM | POA: Diagnosis not present

## 2024-02-11 NOTE — Patient Instructions (Signed)
 Regina Grimes , Thank you for taking time out of your busy schedule to complete your Annual Wellness Visit with me. I enjoyed our conversation and look forward to speaking with you again next year. I, as well as your care team,  appreciate your ongoing commitment to your health goals. Please review the following plan we discussed and let me know if I can assist you in the future. Your Game plan/ To Do List    Referrals: If you haven't heard from the office you've been referred to, please reach out to them at the phone provided.   Follow up Visits: Next Medicare AWV with our clinical staff: 02/12/2025   Have you seen your provider in the last 6 months (3 months if uncontrolled diabetes)? Yes Next Office Visit with your provider: 02/21/2024  Clinician Recommendations:  Aim for 30 minutes of exercise or brisk walking, 6-8 glasses of water, and 5 servings of fruits and vegetables each day.       This is a list of the screening recommended for you and due dates:  Health Maintenance  Topic Date Due   Hepatitis C Screening  Never done   COVID-19 Vaccine (6 - 2024-25 season) 07/24/2023   Screening for Lung Cancer  03/15/2024   Flu Shot  04/25/2024   Medicare Annual Wellness Visit  02/10/2025   Mammogram  10/07/2025   Colon Cancer Screening  09/06/2028   DTaP/Tdap/Td vaccine (4 - Tdap) 08/18/2029   Pneumonia Vaccine  Completed   DEXA scan (bone density measurement)  Completed   Zoster (Shingles) Vaccine  Completed   HPV Vaccine  Aged Out   Meningitis B Vaccine  Aged Out    Advanced directives: (Copy Requested) Please bring a copy of your health care power of attorney and living will to the office to be added to your chart at your convenience. You can mail to [facility] [address] [place], [place] [address] or email to [email or web address] Advance Care Planning is important because it:  [x]  Makes sure you receive the medical care that is consistent with your  values, goals, and preferences  [x]  It provides guidance to your family and loved ones and reduces their decisional burden about whether or not they are making the right decisions based on your wishes.  Follow the link provided in your after visit summary or read over the paperwork we have mailed to you to help you started getting your Advance Directives in place. If you need assistance in completing these, please reach out to us  so that we can help you!  See attachments for Preventive Care and Fall Prevention Tips.

## 2024-02-11 NOTE — Progress Notes (Signed)
 Because this visit was a virtual/telehealth visit,  certain criteria was not obtained, such a blood pressure, CBG if applicable, and timed get up and go. Any medications not marked as "taking" were not mentioned during the medication reconciliation part of the visit. Any vitals not documented were not able to be obtained due to this being a telehealth visit or patient was unable to self-report a recent blood pressure reading due to a lack of equipment at home via telehealth. Vitals that have been documented are verbally provided by the patient.   This visit was performed by a medical professional under my direct supervision. I was immediately available for consultation/collaboration. I have reviewed and agree with the Annual Wellness Visit documentation.  Subjective:   Regina Grimes is a 73 y.o. who presents for a Medicare Wellness preventive visit.  As a reminder, Annual Wellness Visits don't include a physical exam, and some assessments may be limited, especially if this visit is performed virtually. We may recommend an in-person follow-up visit with your provider if needed.  Visit Complete: Virtual I connected with  Regina Grimes on 02/11/24 by a audio enabled telemedicine application and verified that I am speaking with the correct person using two identifiers.  Patient Location: Home  Provider Location: Home Office  I discussed the limitations of evaluation and management by telemedicine. The patient expressed understanding and agreed to proceed.  Vital Signs: Because this visit was a virtual/telehealth visit, some criteria may be missing or patient reported. Any vitals not documented were not able to be obtained and vitals that have been documented are patient reported.  VideoDeclined- This patient declined Librarian, academic. Therefore the visit was completed with audio only.  Persons Participating in Visit: Patient.  AWV Questionnaire: No: Patient  Medicare AWV questionnaire was not completed prior to this visit.  Cardiac Risk Factors include: advanced age (>43men, >30 women);hypertension;Other (see comment), Risk factor comments: copd     Objective:     Today's Vitals   02/11/24 1425 02/11/24 1426  BP: 130/70   Weight: 145 lb (65.8 kg)   Height: 5' 4.5" (1.638 m)   PainSc:  5    Body mass index is 24.5 kg/m.     02/11/2024    2:25 PM 09/20/2016   11:00 AM 09/06/2016    6:26 AM 08/29/2016    9:21 AM 08/01/2016    8:49 AM 11/09/2015    3:30 PM 05/03/2015   10:30 AM  Advanced Directives  Does Patient Have a Medical Advance Directive? Yes Yes Yes Yes Yes Yes No  Type of Estate agent of [place];Living will Living will Living will Healthcare Power of [place];Living will Living will;Healthcare Power of Attorney    Does patient want to make changes to medical advance directive? No - Patient declined No - Patient declined No - Patient declined      Copy of Healthcare Power of Attorney in Chart? No - copy requested  No - copy requested No - copy requested     Would patient like information on creating a medical advance directive?       No - patient declined information    Current Medications (verified) Outpatient Encounter Medications as of 02/11/2024  Medication Sig   albuterol  (VENTOLIN  HFA) 108 (90 Base) MCG/ACT inhaler INHALE 2 PUFFS INTO THE LUNGS EVERY 6 HOURS AS NEEDED FOR WHEEZING OR SHORTNESS OF BREATH   fluocinonide  ointment (LIDEX ) 0.05 % APPLY TOPICALLY TO THE AFFECTED AREA TWICE DAILY AS  NEEDED FOR PSORIASIS   folic acid  (FOLVITE ) 1 MG tablet Take 1 mg by mouth as directed. 1 TABLET ONCE A WEEK AS NEEDED WITH METHOTREXATE .   levothyroxine  (SYNTHROID ) 88 MCG tablet TAKE 1 TABLET(88 MCG) BY MOUTH DAILY   methotrexate  (RHEUMATREX) 2.5 MG tablet TAKE 6 TABLETS BY MOUTH 1 TIME A WEEK   omeprazole (PRILOSEC) 20 MG capsule Take by mouth.   rosuvastatin  (CRESTOR ) 20 MG tablet TAKE 1 TABLET(20 MG) BY  MOUTH 1 TIME A WEEK   valsartan  (DIOVAN ) 80 MG tablet TAKE 1 TABLET BY MOUTH EVERY DAY   amoxicillin  (AMOXIL ) 500 MG capsule Take 2 capsules (1,000 mg total) by mouth 2 (two) times daily. (Patient not taking: Reported on 02/11/2024)   No facility-administered encounter medications on file as of 02/11/2024.    Allergies (verified) Aspirin, Codeine, and Pravastatin sodium   History: Past Medical History:  Diagnosis Date   Allergy    seasonal- claritin D PRN   Anemia    past hx many years  ago   Aortic atherosclerosis (HCC)    Arthritis    arm   Cancer (HCC) 10/2011   Left Lung cancer with Partial Lobectomy.   Coughing up blood    10/2011, admitted to [facility]    GERD (gastroesophageal reflux disease)    Hiatal hernia    Hyperlipidemia    Hypertension    treated for while in hosp. 10/2011, given med. , unsure of name of med., not told to continue    Osteopenia    Personal history of chemotherapy    completed 03-2012   Psoriasis    has used methotrexate  since she was 73y.o., when psoriasis flares. Currently methotrexate  on hold since 11/05/2011    Recurrent upper respiratory infection (URI)    lung infection, treated /w antibiotic - 10/2011   Thyroid  disease    Past Surgical History:  Procedure Laterality Date   abnormal PAP     low grade intrepithelial lesion. Colposcipy okay   APPENDECTOMY     1982- along /w tubal ligation    Benign tumors of left foot  1992/1994   BREAST BIOPSY Right 2013   benign   COLONOSCOPY  04/2001   ESOPHAGOGASTRODUODENOSCOPY  04/2001   FOOT NEUROMA SURGERY     L foot- 1990's   LUNG REMOVAL, PARTIAL  2013   POLYPECTOMY     HPP 2012   THYROIDECTOMY N/A 09/20/2016   Procedure: THYROIDECTOMY;  Surgeon: Von Grumbling, MD;  Location: [facility] ORS;  Service: ENT;  Laterality: N/A;   TUBAL LIGATION  1982   UPPER GASTROINTESTINAL ENDOSCOPY     VD     x 2   VIDEO BRONCHOSCOPY  11/16/2011   Procedure: VIDEO BRONCHOSCOPY WITH FLUORO;  Surgeon: Jennifer Moellers.  Villa Greaser, MD;  Location: [facility];  Service: Cardiopulmonary;  Laterality: Bilateral;   VIDEO BRONCHOSCOPY  12/21/2011   Procedure: VIDEO BRONCHOSCOPY;  Surgeon: Bartley Lightning, MD;  Location: [facility] OR;  Service: Thoracic;  Laterality: N/A;   Family History  Problem Relation Age of Onset   Coronary artery disease Mother    Heart disease Mother        atrial fibrillation   Lung cancer Father    Lung cancer Brother    Breast cancer Paternal Aunt 79   Cirrhosis Maternal Grandmother    Anesthesia problems Neg Hx    Colon cancer Neg Hx    Colon polyps Neg Hx    Esophageal cancer Neg Hx    Stomach cancer Neg  Hx    Rectal cancer Neg Hx    Social History   Socioeconomic History   Marital status: Married    Spouse name: Not on file   Number of children: 2   Years of education: Not on file   Highest education level: Not on file  Occupational History   Occupation: Lab Scientist, forensic    Comment: Retired  Tobacco Use   Smoking status: Former    Current packs/day: 0.00    Average packs/day: 1 pack/day for 44.0 years (44.0 ttl pk-yrs)    Types: Cigarettes    Start date: 06/25/1966    Quit date: 06/25/2010    Years since quitting: 13.6    Passive exposure: Past   Smokeless tobacco: Never  Vaping Use   Vaping status: Never Used  Substance and Sexual Activity   Alcohol use: Yes    Comment: [facility]   Drug use: No   Sexual activity: Not on file  Other Topics Concern   Not on file  Social History Narrative   1 son--died   1 daughter       Has living will   Husband and then daughter would be HC power of attorney   Would accept resuscitation   No tube feeds if cognitively unaware   Social Drivers of Health   Financial Resource Strain: Low Risk  (02/11/2024)   Overall Financial Resource Strain (CARDIA)    Difficulty of Paying Living Expenses: Not hard at all  Food Insecurity: No Food Insecurity (02/11/2024)   Hunger Vital Sign    Worried About Running Out of Food in the Last  Year: Never true    Ran Out of Food in the Last Year: Never true  Transportation Needs: No Transportation Needs (02/11/2024)   PRAPARE - Administrator, Civil Service (Medical): No    Lack of Transportation (Non-Medical): No  Physical Activity: Insufficiently Active (02/11/2024)   Exercise Vital Sign    Days of Exercise per Week: 1 day    Minutes of Exercise per Session: 30 min  Stress: No Stress Concern Present (02/11/2024)   [employer] of Occupational Health - Occupational Stress Questionnaire    Feeling of Stress : Not at all  Social Connections: Moderately Isolated (02/11/2024)   Social Connection and Isolation Panel [NHANES]    Frequency of Communication with Friends and Family: More than three times a week    Frequency of Social Gatherings with Friends and Family: More than three times a week    Attends Religious Services: Never    Database administrator or Organizations: No    Attends Engineer, structural: Never    Marital Status: Married    Tobacco Counseling Counseling given: Not Answered    Clinical Intake:  Pre-visit preparation completed: Yes  Pain : 0-10 Pain Score: 5  Pain Type: Acute pain Pain Location: Shoulder Pain Orientation: Right Pain Descriptors / Indicators: Aching Pain Onset: Today Pain Frequency: Occasional     BMI - recorded: 24.5 Nutritional Status: BMI of 19-24  Normal Nutritional Risks: None Diabetes: No  Lab Results  Component Value Date   HGBA1C 6.0 (H) 03/06/2016     How often do you need to have someone help you when you read instructions, pamphlets, or other written materials from your doctor or pharmacy?: 1 - Never What is the last grade level you completed in school?: GED  Interpreter Needed?: No  Information entered by :: [employer]   Activities of Daily Living  02/11/2024    2:29 PM  In your present state of health, do you have any difficulty performing the following activities:   Hearing? 0  Vision? 0  Difficulty concentrating or making decisions? 0  Walking or climbing stairs? 0  Dressing or bathing? 0  Doing errands, shopping? 0  Preparing Food and eating ? N  Using the Toilet? N  In the past six months, have you accidently leaked urine? Y  Do you have problems with loss of bowel control? N  Managing your Medications? N  Managing your Finances? N  Housekeeping or managing your Housekeeping? N    Patient Care Team: Helaine Llanos, MD as PCP - General Malone Sear, MD (Unknown Physician Specialty) Bartley Lightning, MD (Cardiothoracic Surgery) Helaine Llanos, MD as Referring Physician (Internal Medicine) Sankar, Seeplaputhur G, MD (General Surgery)  Indicate any recent Medical Services you may have received from other than [facility] providers in the past year (date may be approximate).     Assessment:    This is a routine wellness examination for Regina Grimes.  Hearing/Vision screen Hearing Screening - Comments:: No hearing difficulties  Vision Screening - Comments:: Patient wear glasses    Goals Addressed             This Visit's Progress    Patient Stated       Patient would like to lose 10-15 lbs       Depression Screen     02/11/2024    2:31 PM 10/03/2023    9:05 AM 01/30/2023    8:50 AM 01/30/2023    8:29 AM 10/24/2022    7:38 AM 12/23/2020    3:00 PM 08/19/2019    9:35 AM  PHQ 2/9 Scores  PHQ - 2 Score 0 0 0 0 0 0 0  PHQ- 9 Score 2 1         Fall Risk     02/11/2024    2:28 PM 10/03/2023    9:04 AM 01/30/2023    8:50 AM 01/30/2023    8:29 AM  Fall Risk   Falls in the past year? 1 1 0 0  Number falls in past yr: 0 0  0  Injury with Fall? 1 0  0  Comment patient was sore     Risk for fall due to : History of fall(s) History of fall(s)  No Fall Risks  Follow up Falls prevention discussed;Falls evaluation completed Falls evaluation completed  Falls evaluation completed    MEDICARE RISK AT HOME:  Medicare Risk at Home Any stairs in  or around the home?: Yes If so, are there any without handrails?: No Home free of loose throw rugs in walkways, pet beds, electrical cords, etc?: Yes Adequate lighting in your home to reduce risk of falls?: Yes Life alert?: No Use of a cane, walker or w/c?: No Grab bars in the bathroom?: Yes Shower chair or bench in shower?: Yes Elevated toilet seat or a handicapped toilet?: Yes  TIMED UP AND GO:  Was the test performed?  No  Cognitive Function: 6CIT completed        02/11/2024    2:27 PM  6CIT Screen  What Year? 0 points  What month? 0 points  What time? 0 points  Count back from 20 0 points  Months in reverse 0 points  Repeat phrase 0 points  Total Score 0 points    Immunizations Immunization History  Administered Date(s) Administered   Influenza Split 07/15/2011, 07/11/2014  Influenza, High Dose Seasonal PF 06/07/2019   Influenza, Seasonal, Injecte, Preservative Fre 07/18/2015   Influenza,inj,Quad PF,6+ Mos 08/01/2016, 07/15/2017, 07/06/2018   Influenza-Unspecified 06/25/2020, 06/07/2021, 05/29/2023   Moderna Sars-Covid-2 Vaccination 05/29/2023   PFIZER(Purple Top)SARS-COV-2 Vaccination 11/02/2019, 11/25/2019, 05/01/2020, 12/12/2020   Pneumococcal Conjugate-13 08/27/2014   Pneumococcal Polysaccharide-23 06/25/2012, 08/15/2018   RSV,unspecified 05/29/2023   Td 09/26/1994, 04/23/2008, 08/19/2019   Zoster Recombinant(Shingrix) 05/26/2020, 09/27/2020   Zoster, Live 09/30/2014    Screening Tests Health Maintenance  Topic Date Due   Hepatitis C Screening  Never done   COVID-19 Vaccine (6 - 2024-25 season) 07/24/2023   Lung Cancer Screening  03/15/2024   INFLUENZA VACCINE  04/25/2024   Medicare Annual Wellness (AWV)  02/10/2025   MAMMOGRAM  10/07/2025   Colonoscopy  09/06/2028   DTaP/Tdap/Td (4 - Tdap) 08/18/2029   Pneumonia Vaccine 34+ Years old  Completed   DEXA SCAN  Completed   Zoster Vaccines- Shingrix  Completed   HPV VACCINES  Aged Out   Meningococcal  B Vaccine  Aged Out    Health Maintenance  Health Maintenance Due  Topic Date Due   Hepatitis C Screening  Never done   COVID-19 Vaccine (6 - 2024-25 season) 07/24/2023   Health Maintenance Items Addressed:patient declined covid and hep c screening. Patient get lung cancer screening next month   Additional Screening:  Vision Screening: Recommended annual ophthalmology exams for early detection of glaucoma and other disorders of the eye.  Dental Screening: Recommended annual dental exams for proper oral hygiene  Community Resource Referral / Chronic Care Management: CRR required this visit?  No   CCM required this visit?  No   Plan:    I have personally reviewed and noted the following in the patient's chart:   Medical and social history Use of alcohol, tobacco or illicit drugs  Current medications and supplements including opioid prescriptions. Patient is not currently taking opioid prescriptions. Functional ability and status Nutritional status Physical activity Advanced directives List of other physicians Hospitalizations, surgeries, and ER visits in previous 12 months Vitals Screenings to include cognitive, depression, and falls Referrals and appointments  In addition, I have reviewed and discussed with patient certain preventive protocols, quality metrics, and best practice recommendations. A written personalized care plan for preventive services as well as general preventive health recommendations were provided to patient.   Regina Grimes, [place]   02/11/2024   After Visit Summary: (MyChart) Due to this being a telephonic visit, the after visit summary with patients personalized plan was offered to patient via MyChart   Notes: Nothing significant to report at this time.

## 2024-02-20 ENCOUNTER — Encounter: Admitting: Internal Medicine

## 2024-02-21 ENCOUNTER — Telehealth: Payer: Self-pay | Admitting: Internal Medicine

## 2024-02-21 ENCOUNTER — Encounter: Payer: Self-pay | Admitting: Internal Medicine

## 2024-02-21 ENCOUNTER — Ambulatory Visit (INDEPENDENT_AMBULATORY_CARE_PROVIDER_SITE_OTHER): Admitting: Internal Medicine

## 2024-02-21 ENCOUNTER — Other Ambulatory Visit: Payer: Self-pay | Admitting: Internal Medicine

## 2024-02-21 ENCOUNTER — Ambulatory Visit: Payer: Self-pay | Admitting: Internal Medicine

## 2024-02-21 VITALS — BP 140/70 | HR 63 | Temp 98.0°F | Ht 64.25 in | Wt 147.0 lb

## 2024-02-21 DIAGNOSIS — K219 Gastro-esophageal reflux disease without esophagitis: Secondary | ICD-10-CM

## 2024-02-21 DIAGNOSIS — I10 Essential (primary) hypertension: Secondary | ICD-10-CM

## 2024-02-21 DIAGNOSIS — D649 Anemia, unspecified: Secondary | ICD-10-CM

## 2024-02-21 DIAGNOSIS — J4489 Other specified chronic obstructive pulmonary disease: Secondary | ICD-10-CM | POA: Diagnosis not present

## 2024-02-21 DIAGNOSIS — Z1159 Encounter for screening for other viral diseases: Secondary | ICD-10-CM

## 2024-02-21 DIAGNOSIS — I700 Atherosclerosis of aorta: Secondary | ICD-10-CM | POA: Diagnosis not present

## 2024-02-21 DIAGNOSIS — J439 Emphysema, unspecified: Secondary | ICD-10-CM

## 2024-02-21 DIAGNOSIS — Z0000 Encounter for general adult medical examination without abnormal findings: Secondary | ICD-10-CM

## 2024-02-21 DIAGNOSIS — E890 Postprocedural hypothyroidism: Secondary | ICD-10-CM

## 2024-02-21 LAB — LIPID PANEL
Cholesterol: 248 mg/dL — ABNORMAL HIGH (ref 0–200)
HDL: 34.9 mg/dL — ABNORMAL LOW (ref >39.00)
LDL Cholesterol: 172 mg/dL — ABNORMAL HIGH (ref 0–99)
NonHDL: 212.73
Total CHOL/HDL Ratio: 7
Triglycerides: 203 mg/dL — ABNORMAL HIGH (ref 0.0–149.0)
VLDL: 40.6 mg/dL — ABNORMAL HIGH (ref 0.0–40.0)

## 2024-02-21 LAB — COMPREHENSIVE METABOLIC PANEL WITH GFR
ALT: 11 U/L (ref 0–35)
AST: 17 U/L (ref 0–37)
Albumin: 4.4 g/dL (ref 3.5–5.2)
Alkaline Phosphatase: 73 U/L (ref 39–117)
BUN: 12 mg/dL (ref 6–23)
CO2: 30 meq/L (ref 19–32)
Calcium: 9.2 mg/dL (ref 8.4–10.5)
Chloride: 104 meq/L (ref 96–112)
Creatinine, Ser: 0.71 mg/dL (ref 0.40–1.20)
GFR: 85.93 mL/min (ref >60.00)
Glucose, Bld: 93 mg/dL (ref 70–99)
Potassium: 4.5 meq/L (ref 3.5–5.1)
Sodium: 140 meq/L (ref 135–145)
Total Bilirubin: 0.4 mg/dL (ref 0.2–1.2)
Total Protein: 7.1 g/dL (ref 6.0–8.3)

## 2024-02-21 LAB — CBC
HCT: 31.6 % — ABNORMAL LOW (ref 36.0–46.0)
Hemoglobin: 10.2 g/dL — ABNORMAL LOW (ref 12.0–15.0)
MCHC: 32.4 g/dL (ref 30.0–36.0)
MCV: 80.8 fl (ref 78.0–100.0)
Platelets: 273 10*3/uL (ref 150.0–400.0)
RBC: 3.91 Mil/uL (ref 3.87–5.11)
RDW: 16.4 % — ABNORMAL HIGH (ref 11.5–15.5)
WBC: 5.1 10*3/uL (ref 4.0–10.5)

## 2024-02-21 LAB — T4, FREE: Free T4: 1.07 ng/dL (ref 0.60–1.60)

## 2024-02-21 LAB — TSH: TSH: 0.13 u[IU]/mL — ABNORMAL LOW (ref 0.35–5.50)

## 2024-02-21 MED ORDER — LEVOTHYROXINE SODIUM 75 MCG PO TABS: 75.0000 ug | ORAL_TABLET | Freq: Every day | ORAL | 3 refills | Status: AC

## 2024-02-21 NOTE — Assessment & Plan Note (Signed)
On imaging Is on rosuvastatin

## 2024-02-21 NOTE — Assessment & Plan Note (Signed)
 Doing well Discussed adding resistance work to walking Flu/COVID vaccines in the fall Colon due 2029 Recent mammogram--due again next year

## 2024-02-21 NOTE — Assessment & Plan Note (Signed)
Seems euthyroid on levothyroxine 88

## 2024-02-21 NOTE — Progress Notes (Signed)
 Subjective:    Patient ID: Regina Grimes, female    DOB: 18-Oct-1950, 73 y.o.   MRN: [id number]  HPI Here for physical  Doing okay Started back on treadmill--noticed a little weight gain  Takes the methotrexate  when psoriasis breaks out May have flared when on a cruise May go 2-3 months without---then uses with outbreak for a few weeks in a row  Hasn't taken BP lately Took sudafed today--may be reason for the increase today BP 140/70 at eye doctor a few weeks ago  Known aortic atherosclerosis  Current Outpatient Medications on File Prior to Visit  Medication Sig Dispense Refill   albuterol  (VENTOLIN  HFA) 108 (90 Base) MCG/ACT inhaler INHALE 2 PUFFS INTO THE LUNGS EVERY 6 HOURS AS NEEDED FOR WHEEZING OR SHORTNESS OF BREATH 8.5 g 0   amoxicillin  (AMOXIL ) 500 MG capsule Take 2 capsules (1,000 mg total) by mouth 2 (two) times daily. 40 capsule 0   fluocinonide  ointment (LIDEX ) 0.05 % APPLY TOPICALLY TO THE AFFECTED AREA TWICE DAILY AS NEEDED FOR PSORIASIS 30 g 5   folic acid  (FOLVITE ) 1 MG tablet Take 1 mg by mouth as directed. 1 TABLET ONCE A WEEK AS NEEDED WITH METHOTREXATE .     levothyroxine  (SYNTHROID ) 88 MCG tablet TAKE 1 TABLET(88 MCG) BY MOUTH DAILY 90 tablet 0   methotrexate  (RHEUMATREX) 2.5 MG tablet TAKE 6 TABLETS BY MOUTH 1 TIME A WEEK 72 tablet 3   omeprazole (PRILOSEC) 20 MG capsule Take by mouth.     rosuvastatin  (CRESTOR ) 20 MG tablet TAKE 1 TABLET(20 MG) BY MOUTH 1 TIME A WEEK 13 tablet 3   valsartan  (DIOVAN ) 80 MG tablet TAKE 1 TABLET BY MOUTH EVERY DAY 90 tablet 0   No current facility-administered medications on file prior to visit.    Allergies  Allergen Reactions   Aspirin     REACTION: stomach burning   Codeine     Upset stomach   Pravastatin Sodium     REACTION: mood changes and snapping at everyone (pt. Is unaware of this allergy)     Past Medical History:  Diagnosis Date   Allergy    seasonal- claritin D PRN   Anemia    past hx many years   ago   Aortic atherosclerosis (HCC)    Arthritis    arm   Cancer (HCC) 10/2011   Left Lung cancer with Partial Lobectomy.   Coughing up blood    10/2011, admitted to [facility]    GERD (gastroesophageal reflux disease)    Hiatal hernia    Hyperlipidemia    Hypertension    treated for while in hosp. 10/2011, given med. , unsure of name of med., not told to continue    Osteopenia    Personal history of chemotherapy    completed 03-2012   Psoriasis    has used methotrexate  since she was 73y.o., when psoriasis flares. Currently methotrexate  on hold since 11/05/2011    Recurrent upper respiratory infection (URI)    lung infection, treated /w antibiotic - 10/2011   Thyroid  disease     Past Surgical History:  Procedure Laterality Date   abnormal PAP     low grade intrepithelial lesion. Colposcipy okay   APPENDECTOMY     1982- along /w tubal ligation    Benign tumors of left foot  1992/1994   BREAST BIOPSY Right 2013   benign   COLONOSCOPY  04/2001   ESOPHAGOGASTRODUODENOSCOPY  04/2001   FOOT NEUROMA SURGERY     L  foot- 1990's   LUNG REMOVAL, PARTIAL  2013   POLYPECTOMY     HPP 2012   THYROIDECTOMY N/A 09/20/2016   Procedure: THYROIDECTOMY;  Surgeon: Von Grumbling, MD;  Location: [facility];  Service: ENT;  Laterality: N/A;   TUBAL LIGATION  1982   UPPER GASTROINTESTINAL ENDOSCOPY     VD     x 2   VIDEO BRONCHOSCOPY  11/16/2011   Procedure: VIDEO BRONCHOSCOPY WITH FLUORO;  Surgeon: Jennifer Moellers. Villa Greaser, MD;  Location: [facility];  Service: Cardiopulmonary;  Laterality: Bilateral;   VIDEO BRONCHOSCOPY  12/21/2011   Procedure: VIDEO BRONCHOSCOPY;  Surgeon: Bartley Lightning, MD;  Location: [facility];  Service: Thoracic;  Laterality: N/A;    Family History  Problem Relation Age of Onset   Coronary artery disease Mother    Heart disease Mother        atrial fibrillation   Lung cancer Father    Lung cancer Brother    Breast cancer Paternal Aunt 80   Cirrhosis Maternal Grandmother     Anesthesia problems Neg Hx    Colon cancer Neg Hx    Colon polyps Neg Hx    Esophageal cancer Neg Hx    Stomach cancer Neg Hx    Rectal cancer Neg Hx     Social History   Socioeconomic History   Marital status: Married    Spouse name: Not on file   Number of children: 2   Years of education: Not on file   Highest education level: Not on file  Occupational History   Occupation: Lab Scientist, forensic    Comment: Retired  Tobacco Use   Smoking status: Former    Current packs/day: 0.00    Average packs/day: 1 pack/day for 44.0 years (44.0 ttl pk-yrs)    Types: Cigarettes    Start date: 06/25/1966    Quit date: 06/25/2010    Years since quitting: 13.6    Passive exposure: Past   Smokeless tobacco: Never  Vaping Use   Vaping status: Never Used  Substance and Sexual Activity   Alcohol use: Yes    Comment: OCC   Drug use: No   Sexual activity: Not on file  Other Topics Concern   Not on file  Social History Narrative   1 son--died   1 daughter       Has living will   Husband and then daughter would be HC power of attorney   Would accept resuscitation   No tube feeds if cognitively unaware   Social Drivers of Health   Financial Resource Strain: Low Risk  (02/11/2024)   Overall Financial Resource Strain (CARDIA)    Difficulty of Paying Living Expenses: Not hard at all  Food Insecurity: No Food Insecurity (02/11/2024)   Hunger Vital Sign    Worried About Running Out of Food in the Last Year: Never true    Ran Out of Food in the Last Year: Never true  Transportation Needs: No Transportation Needs (02/11/2024)   PRAPARE - Administrator, Civil Service (Medical): No    Lack of Transportation (Non-Medical): No  Physical Activity: Insufficiently Active (02/11/2024)   Exercise Vital Sign    Days of Exercise per Week: 1 day    Minutes of Exercise per Session: 30 min  Stress: No Stress Concern Present (02/11/2024)   [employer] of Occupational Health -  Occupational Stress Questionnaire    Feeling of Stress : Not at all  Social Connections: Moderately Isolated (02/11/2024)  Social Advertising account executive [NHANES]    Frequency of Communication with Friends and Family: More than three times a week    Frequency of Social Gatherings with Friends and Family: More than three times a week    Attends Religious Services: Never    Database administrator or Organizations: No    Attends Banker Meetings: Never    Marital Status: Married  Catering manager Violence: Not At Risk (02/11/2024)   Humiliation, Afraid, Rape, and Kick questionnaire    Fear of Current or Ex-Partner: No    Emotionally Abused: No    Physically Abused: No    Sexually Abused: No   Review of Systems  Constitutional:  Negative for fatigue and unexpected weight change.       Wears seat belt  HENT:  Negative for tinnitus.        Left ear hearing is off--plans audiology Full dentures  Eyes:        Early cataracts--not ready for surgery  Respiratory:  Negative for cough, chest tightness and shortness of breath.        Uses albuterol  occasionally --will get chest tightness with exertion  Cardiovascular:  Negative for chest pain, palpitations and leg swelling.  Gastrointestinal:  Negative for blood in stool and constipation.       Rare heartburn--with greasy food (omeprazole helps)  Endocrine: Negative for polydipsia and polyuria.  Genitourinary:  Negative for dyspareunia, dysuria and hematuria.  Musculoskeletal:  Negative for arthralgias, back pain and joint swelling.       Hunches shoulders some  Skin:  Positive for rash.       No suspicious skin lesions now--did have some excisions at derm recently  Allergic/Immunologic: Positive for environmental allergies. Negative for immunocompromised state.       Uses flonase/sudafed prn  Neurological:  Negative for dizziness, syncope, light-headedness and headaches.  Hematological:  Negative for adenopathy.  Bruises/bleeds easily.  Psychiatric/Behavioral:  Negative for dysphoric mood. The patient is not nervous/anxious.        Mild sleep issues--usually gets 6-7 hours a night       Objective:   Physical Exam Constitutional:      Appearance: Normal appearance.  HENT:     Mouth/Throat:     Pharynx: No oropharyngeal exudate or posterior oropharyngeal erythema.  Eyes:     Conjunctiva/sclera: Conjunctivae normal.     Pupils: Pupils are equal, round, and reactive to light.  Cardiovascular:     Rate and Rhythm: Normal rate and regular rhythm.     Pulses: Normal pulses.     Heart sounds:     No gallop.     Comments: Very soft systolic murmur Pulmonary:     Effort: Pulmonary effort is normal.     Breath sounds: No wheezing or rales.     Comments: Mildly decreased breath sounds but clear Abdominal:     Palpations: Abdomen is soft.     Tenderness: There is no abdominal tenderness.  Musculoskeletal:     Cervical back: Neck supple.     Right lower leg: No edema.     Left lower leg: No edema.  Lymphadenopathy:     Cervical: No cervical adenopathy.  Skin:    Findings: No rash.  Neurological:     General: No focal deficit present.     Mental Status: She is alert and oriented to person, place, and time.  Psychiatric:        Mood and Affect: Mood normal.  Behavior: Behavior normal.            Assessment & Plan:

## 2024-02-21 NOTE — Assessment & Plan Note (Signed)
 Mild and hasn't smoked in some time Uses albuterol  prn Yearly chest CT

## 2024-02-21 NOTE — Assessment & Plan Note (Signed)
 BP Readings from Last 3 Encounters:  02/21/24 (!) 140/70  02/11/24 130/70  12/04/23 130/70   Generally okay Discussed avoiding the sudafed

## 2024-02-21 NOTE — Assessment & Plan Note (Signed)
 No symptoms on the omeprazole daily

## 2024-02-21 NOTE — Telephone Encounter (Signed)
 Pt wanted to know if Dr. Cherlyn Cornet would accept her as a new pt since Dr. Joelle Musca is retiring? Per Dr. Letvak, pt needs to return 1 yr for awv. Pt states she's has seen Dr. Cherlyn Cornet in the past. Call back # [phone]

## 2024-02-22 ENCOUNTER — Other Ambulatory Visit: Payer: Self-pay

## 2024-02-22 LAB — HEPATITIS C ANTIBODY: Hepatitis C Ab: NONREACTIVE

## 2024-02-22 NOTE — Telephone Encounter (Signed)
 Spoke to pt, scheduled toc for 02/26/25

## 2024-03-05 ENCOUNTER — Other Ambulatory Visit (INDEPENDENT_AMBULATORY_CARE_PROVIDER_SITE_OTHER): Payer: Self-pay

## 2024-03-05 DIAGNOSIS — D649 Anemia, unspecified: Secondary | ICD-10-CM

## 2024-03-09 ENCOUNTER — Ambulatory Visit: Payer: Self-pay | Admitting: Internal Medicine

## 2024-03-13 ENCOUNTER — Other Ambulatory Visit: Payer: Self-pay | Admitting: Internal Medicine

## 2024-03-13 DIAGNOSIS — R195 Other fecal abnormalities: Secondary | ICD-10-CM

## 2024-03-13 NOTE — Progress Notes (Signed)
 Referral placed.

## 2024-03-17 ENCOUNTER — Ambulatory Visit
Admission: RE | Admit: 2024-03-17 | Discharge: 2024-03-17 | Disposition: A | Discharge status: Home / Self Care | Source: Ambulatory Visit | Attending: Acute Care | Admitting: Acute Care

## 2024-03-17 DIAGNOSIS — Z87891 Personal history of nicotine dependence: Secondary | ICD-10-CM | POA: Diagnosis not present

## 2024-03-17 DIAGNOSIS — Z122 Encounter for screening for malignant neoplasm of respiratory organs: Secondary | ICD-10-CM | POA: Diagnosis not present

## 2024-03-31 ENCOUNTER — Encounter: Payer: Self-pay | Admitting: Internal Medicine

## 2024-03-31 ENCOUNTER — Other Ambulatory Visit

## 2024-03-31 ENCOUNTER — Other Ambulatory Visit (INDEPENDENT_AMBULATORY_CARE_PROVIDER_SITE_OTHER)

## 2024-03-31 DIAGNOSIS — D649 Anemia, unspecified: Secondary | ICD-10-CM

## 2024-03-31 MED ORDER — AZITHROMYCIN 250 MG PO TABS: ORAL_TABLET | ORAL | 0 refills | Status: DC

## 2024-04-01 ENCOUNTER — Ambulatory Visit: Payer: Self-pay | Admitting: Internal Medicine

## 2024-04-01 LAB — IBC + FERRITIN
Ferritin: 20.1 ng/mL (ref 10.0–291.0)
Iron: 45 ug/dL (ref 42–145)
Saturation Ratios: 11.8 % — ABNORMAL LOW (ref 20.0–50.0)
TIBC: 380.8 ug/dL (ref 250.0–450.0)
Transferrin: 272 mg/dL (ref 212.0–360.0)

## 2024-04-01 LAB — CBC
HCT: 32 % — ABNORMAL LOW (ref 36.0–46.0)
Hemoglobin: 10.4 g/dL — ABNORMAL LOW (ref 12.0–15.0)
MCHC: 32.5 g/dL (ref 30.0–36.0)
MCV: 83.2 fl (ref 78.0–100.0)
Platelets: 284 K/uL (ref 150.0–400.0)
RBC: 3.85 Mil/uL — ABNORMAL LOW (ref 3.87–5.11)
RDW: 19.4 % — ABNORMAL HIGH (ref 11.5–15.5)
WBC: 6 K/uL (ref 4.0–10.5)

## 2024-04-01 LAB — T4, FREE: Free T4: 1 ng/dL (ref 0.60–1.60)

## 2024-04-01 LAB — TSH: TSH: 0.29 u[IU]/mL — ABNORMAL LOW (ref 0.35–5.50)

## 2024-04-02 ENCOUNTER — Telehealth: Payer: Self-pay

## 2024-04-02 ENCOUNTER — Other Ambulatory Visit: Payer: Self-pay

## 2024-04-02 DIAGNOSIS — Z87891 Personal history of nicotine dependence: Secondary | ICD-10-CM

## 2024-04-02 DIAGNOSIS — R911 Solitary pulmonary nodule: Secondary | ICD-10-CM

## 2024-04-02 DIAGNOSIS — Z122 Encounter for screening for malignant neoplasm of respiratory organs: Secondary | ICD-10-CM

## 2024-04-02 NOTE — Telephone Encounter (Signed)
 Called patient and reviewed results. She was started on antibiotics yesterday. She will complete a f/u scan on 05/20/2024 at [facility]. No additional concerns. Results and plan to PCP.   IMPRESSION: 1. Lung-RADS 0, incomplete. Additional lung cancer screening CT images/or comparison to prior chest CT examinations is needed. Development of left upper lobe peribronchovascular ground-glass nodularity, likely infectious. Recommend appropriate antibiotic therapy and lung cancer screening follow-up at 6-8 weeks. 2. Status post left lower lobectomy without findings of recurrent or metastatic disease. 3. Aortic atherosclerosis (ICD10-I70.0), coronary artery atherosclerosis and emphysema (ICD10-J43.9).

## 2024-04-25 ENCOUNTER — Ambulatory Visit: Admitting: Gastroenterology

## 2024-04-25 ENCOUNTER — Encounter: Payer: Self-pay | Admitting: Gastroenterology

## 2024-04-25 VITALS — BP 124/64 | HR 62 | Ht 64.0 in | Wt 143.1 lb

## 2024-04-25 DIAGNOSIS — Z8719 Personal history of other diseases of the digestive system: Secondary | ICD-10-CM

## 2024-04-25 DIAGNOSIS — D509 Iron deficiency anemia, unspecified: Secondary | ICD-10-CM | POA: Diagnosis not present

## 2024-04-25 DIAGNOSIS — R195 Other fecal abnormalities: Secondary | ICD-10-CM

## 2024-04-25 DIAGNOSIS — K2100 Gastro-esophageal reflux disease with esophagitis, without bleeding: Secondary | ICD-10-CM | POA: Diagnosis not present

## 2024-04-25 MED ORDER — NA SULFATE-K SULFATE-MG SULF 17.5-3.13-1.6 GM/177ML PO SOLN
1.0000 | Freq: Once | ORAL | 0 refills | Status: AC
Start: 2024-04-25 — End: 2024-04-25

## 2024-04-25 NOTE — Progress Notes (Signed)
 Chief Complaint: positive fecal occult, IDA Primary GI Doctor: (Dr. Aneita) Dr. San  HPI:  Patient is a  73  year old female patient with past medical history of GERD with hiatal hernia, hypertension, hyperlipidemia, thyroid  disease, left lung cancer with partial lobectomy (2013), who was referred to me by Jimmy Charlie FERNS, MD on 03/13/24 for a evaluation of iron deficiency anemia and positive fecal occult.    Interval History   Patient presents for evaluation of iron deficiency anemia and positive fecal occult.  Patient tells me she had issues with anemia several years ago with her pregnancy that resolved on its own.  Patient states she has not had any issues since then.  Patient started on oral iron supplement 1 tablet p.o. daily in [place].  Patient states since she has started the oral iron her stools have been darker. She does consume meat, not vegan. No blood transfusions. No blood in stool noted.  No abnormal vaginal bleeding. She has a lot of fatigue and taking naps during day which is unusual for her.  She has also had some dizziness as well.  She takes Bayer ASA 1 tablet high dose 325mg ? Prn for headaches, recently more frequent for headaches.   She has had intermittent stomach pain, but not consistent.    She has history of GERD and takes omeprazole 20 mg po daily. Patient denies dysphagia. Patient denies nausea or vomiting. She has lost 4lbs in last few months but does not report change in eating habits or poor appetite.   Patient denies altered bowel habits or rectal bleeding.  No alcohol use. Nonsmoker.   Surgical history:none   Patient's family history includes: father with CA- unsure where it started, personal history of Lung CA  Wt Readings from Last 3 Encounters:  04/25/24 143 lb 2 oz (64.9 kg)  02/21/24 147 lb (66.7 kg)  02/11/24 145 lb (65.8 kg)    Past Medical History:  Diagnosis Date   Allergy    seasonal- claritin D PRN   Anemia    past hx many years   ago   Aortic atherosclerosis (HCC)    Arthritis    arm   Cancer (HCC) 10/2011   Left Lung cancer with Partial Lobectomy.   Coughing up blood    10/2011, admitted to [facility]    GERD (gastroesophageal reflux disease)    Hiatal hernia    Hyperlipidemia    Hypertension    treated for while in hosp. 10/2011, given med. , unsure of name of med., not told to continue    Osteopenia    Personal history of chemotherapy    completed 03-2012   Psoriasis    has used methotrexate  since she was 73y.o., when psoriasis flares. Currently methotrexate  on hold since 11/05/2011    Recurrent upper respiratory infection (URI)    lung infection, treated /w antibiotic - 10/2011   Thyroid  disease     Past Surgical History:  Procedure Laterality Date   abnormal PAP     low grade intrepithelial lesion. Colposcipy okay   APPENDECTOMY     1982- along /w tubal ligation    Benign tumors of left foot  1992/1994   BREAST BIOPSY Right 2013   benign   COLONOSCOPY  04/2001   ESOPHAGOGASTRODUODENOSCOPY  04/2001   FOOT NEUROMA SURGERY     L foot- 1990's   LUNG REMOVAL, PARTIAL  2013   POLYPECTOMY     HPP 2012   THYROIDECTOMY N/A 09/20/2016   Procedure:  THYROIDECTOMY;  Surgeon: Deward Dolly, MD;  Location: [facility];  Service: ENT;  Laterality: N/A;   TUBAL LIGATION  1982   UPPER GASTROINTESTINAL ENDOSCOPY     VD     x 2   VIDEO BRONCHOSCOPY  11/16/2011   Procedure: VIDEO BRONCHOSCOPY WITH FLUORO;  Surgeon: Harden GAILS. Jude, MD;  Location: [facility];  Service: Cardiopulmonary;  Laterality: Bilateral;   VIDEO BRONCHOSCOPY  12/21/2011   Procedure: VIDEO BRONCHOSCOPY;  Surgeon: Dorise MARLA Fellers, MD;  Location: [facility];  Service: Thoracic;  Laterality: N/A;    Current Outpatient Medications  Medication Sig Dispense Refill   albuterol  (VENTOLIN  HFA) 108 (90 Base) MCG/ACT inhaler INHALE 2 PUFFS INTO THE LUNGS EVERY 6 HOURS AS NEEDED FOR WHEEZING OR SHORTNESS OF BREATH 8.5 g 0   amoxicillin  (AMOXIL ) 500 MG capsule  Take 2 capsules (1,000 mg total) by mouth 2 (two) times daily. 40 capsule 0   azithromycin  (ZITHROMAX  Z-PAK) 250 MG tablet Take 2 tablets (500 mg) on  Day 1,  followed by 1 tablet (250 mg) once daily on Days 2 through 5. 6 each 0   fluocinonide  ointment (LIDEX ) 0.05 % APPLY TOPICALLY TO THE AFFECTED AREA TWICE DAILY AS NEEDED FOR PSORIASIS 30 g 5   folic acid  (FOLVITE ) 1 MG tablet Take 1 mg by mouth as directed. 1 TABLET ONCE A WEEK AS NEEDED WITH METHOTREXATE .     levothyroxine  (SYNTHROID ) 75 MCG tablet Take 1 tablet (75 mcg total) by mouth daily. 90 tablet 3   methotrexate  (RHEUMATREX) 2.5 MG tablet TAKE 6 TABLETS BY MOUTH 1 TIME A WEEK 72 tablet 3   Na Sulfate-K Sulfate-Mg Sulfate concentrate (SUPREP) 17.5-3.13-1.6 GM/177ML SOLN Take 1 kit (354 mLs total) by mouth once for 1 dose. 354 mL 0   omeprazole (PRILOSEC) 20 MG capsule Take by mouth.     valsartan  (DIOVAN ) 80 MG tablet TAKE 1 TABLET BY MOUTH EVERY DAY 90 tablet 0   rosuvastatin  (CRESTOR ) 20 MG tablet TAKE 1 TABLET(20 MG) BY MOUTH 1 TIME A WEEK (Patient not taking: Reported on 04/25/2024) 13 tablet 3   No current facility-administered medications for this visit.    Allergies as of 04/25/2024 - Review Complete 04/25/2024  Allergen Reaction Noted   Aspirin  12/06/2010   Codeine  11/15/2011   Pravastatin sodium  04/23/2008    Family History  Problem Relation Age of Onset   Coronary artery disease Mother    Heart disease Mother        atrial fibrillation   Lung cancer Father    Lung cancer Brother    Breast cancer Paternal Aunt 15   Cirrhosis Maternal Grandmother    Anesthesia problems Neg Hx    Colon cancer Neg Hx    Colon polyps Neg Hx    Esophageal cancer Neg Hx    Stomach cancer Neg Hx    Rectal cancer Neg Hx     Review of Systems:    Constitutional: No weight loss, fever, chills, weakness or fatigue HEENT: Eyes: No change in vision               Ears, Nose, Throat:  No change in hearing or congestion Skin: No rash  or itching Cardiovascular: No chest pain, chest pressure or palpitations   Respiratory: No SOB or cough Gastrointestinal: See HPI and otherwise negative Genitourinary: No dysuria or change in urinary frequency Neurological: No headache, dizziness or syncope Musculoskeletal: No new muscle or joint pain Hematologic: No bleeding or bruising Psychiatric:  No history of depression or anxiety    Physical Exam:  Vital signs: BP 124/64 (BP Location: Left Arm, Patient Position: Sitting, Cuff Size: Normal)   Pulse 62   Ht 5' 4 (1.626 m)   Wt 143 lb 2 oz (64.9 kg)   BMI 24.57 kg/m   Constitutional:   Pleasant female appears to be in NAD, Well developed, Well nourished, alert and cooperative Throat: Oral cavity and pharynx without inflammation, swelling or lesion.  Respiratory: Respirations even and unlabored. Lungs clear to auscultation bilaterally.   No wheezes, crackles, or rhonchi.  Cardiovascular: Normal S1, S2. Regular rate and rhythm. No peripheral edema, cyanosis or pallor.  Gastrointestinal:  Soft, nondistended, nontender. No rebound or guarding. Normal bowel sounds. No appreciable masses or hepatomegaly. Rectal:  Not performed.  Msk:  Symmetrical without gross deformities. Without edema, no deformity or joint abnormality.  Neurologic:  Alert and  oriented x4;  grossly normal neurologically.  Skin:   Dry and intact without significant lesions or rashes.  RELEVANT LABS AND IMAGING: CBC    Latest Ref Rng & Units 03/31/2024    2:07 PM 02/21/2024   10:08 AM 01/30/2023    9:08 AM  CBC  WBC 4.0 - 10.5 K/uL 6.0  5.1  6.7   Hemoglobin 12.0 - 15.0 g/dL 89.5  89.7  87.9   Hematocrit 36.0 - 46.0 % 32.0  31.6  35.6   Platelets 150.0 - 400.0 K/uL 284.0  273.0  299.0      CMP     Latest Ref Rng & Units 02/21/2024   10:08 AM 01/30/2023    9:08 AM 01/06/2022   10:46 AM  CMP  Glucose 70 - 99 mg/dL 93  96  897   BUN 6 - 23 mg/dL 12  16  11    Creatinine 0.40 - 1.20 mg/dL 9.28  9.27  9.41    Sodium 135 - 145 mEq/L 140  138  141   Potassium 3.5 - 5.1 mEq/L 4.5  4.4  4.7   Chloride 96 - 112 mEq/L 104  103  102   CO2 19 - 32 mEq/L 30  28  25    Calcium  8.4 - 10.5 mg/dL 9.2  9.3  9.2   Total Protein 6.0 - 8.3 g/dL 7.1  7.5  7.3   Total Bilirubin 0.2 - 1.2 mg/dL 0.4  0.5  0.5   Alkaline Phos 39 - 117 U/L 73  66  93   AST 0 - 37 U/L 17  21  36   ALT 0 - 35 U/L 11  13  37      Lab Results  Component Value Date   TSH 0.29 (L) 03/31/2024    Lab Results  Component Value Date   IRON 45 03/31/2024   TIBC 380.8 03/31/2024   FERRITIN 20.1 03/31/2024  03/05/2024 fecal occult positive GI procedures: 09/06/21 colonoscopy with Dr. Aneita - One 4 mm polyp in the transverse colon, removed with a cold biopsy forceps. Resected and retrieved. - One 6 mm polyp in the descending colon, removed with a cold snare. Resected and retrieved. - Moderate diverticulosis in the left colon. - The examination was otherwise normal on direct and retroflexion views. Path: Surgical [P], colon, descending and transverse, polyp (2) TUBULAR ADENOMA AND A HYPERPLASTIC POLYP. NEGATIVE FOR HIGH-GRADE DYSPLASIA 11/2010 colonoscopy with Dr. Aneita 5 mm sessile polyp in the rectum Normal colon 04/2001 colonoscopy with Dr. Cloretta Entire colon torturous. Cecum submucosal mass in cecum about 5 cm.  External hemorrhoids grade 2. 04/2001 EGD for anemia with Dr. Cloretta Distal esophagus stricture Schatzki ring with mild esophagitis that was dilated.  No heme present on extraction.  Mucosal abnormality fundus to atrium.  Granular mucosa.  Minimal gastritis.   Assessment: Encounter Diagnoses  Name Primary?   Iron deficiency anemia, unspecified iron deficiency anemia type Yes   Positive fecal occult blood test    Gastroesophageal reflux disease with esophagitis without hemorrhage     73 year old female patient who presents for iron deficiency anemia and positive fecal occult.  Patient currently taking oral iron  supplementation with stable hemoglobin 12 (Deniqua Perry 2024) Hgb 10.2>>12.0.  Patient has had symptoms of fatigue and dizziness.  Patient admits to taking NSAIDs intermittently which we discussed can potentially increase risk of gastric ulcers.  Will have patient discontinue.  Patient's last colonoscopy in December 2022 with 1 tubular adenoma and 1 HPP.  Patient had endoscopy back in 2002 for evaluation of anemia which showed Schatzki ring and mild esophagitis and mild gastritis.  Will go ahead and schedule upper endoscopy and colonoscopy to further evaluate for source of bleeding.  If negative workup Dylen Mcelhannon consider small capsule endoscopy.  Plan: -No NSAIDs -continue iron 1 tab po daily -Continue omeprazole -Schedule EGD in [facility] with Dr. San. The risks and benefits of EGD with possible biopsies and esophageal dilation were discussed with the patient who agrees to proceed. -Schedule for a colonoscopy in [facility] with Dr. San. The risks and benefits of colonoscopy with possible polypectomy / biopsies were discussed and the patient agrees to proceed.   Thank you for the courtesy of this consult. Please call me with any questions or concerns.   Berkleigh Beckles, FNP-C [facility] 04/25/2024, 5:35 PM  Cc: Jimmy Charlie FERNS, MD

## 2024-04-25 NOTE — Patient Instructions (Addendum)
 No NSAIDs  We have sent the following medications to your pharmacy for you to pick up at your convenience: SUPREP  You have been scheduled for an endoscopy and colonoscopy. Please follow the written instructions given to you at your visit today.  If you use inhalers (even only as needed), please bring them with you on the day of your procedure.  DO NOT TAKE 7 DAYS PRIOR TO TEST- Trulicity (dulaglutide) Ozempic, Wegovy (semaglutide) Mounjaro (tirzepatide) Bydureon Bcise (exanatide extended release)  DO NOT TAKE 1 DAY PRIOR TO YOUR TEST Rybelsus (semaglutide) Adlyxin (lixisenatide) Victoza (liraglutide) Byetta (exanatide) ___________________________________________________________________________  Due to recent changes in healthcare laws, you may see the results of your imaging and laboratory studies on MyChart before your provider has had a chance to review them.  We understand that in some cases there may be results that are confusing or concerning to you. Not all laboratory results come back in the same time frame and the provider may be waiting for multiple results in order to interpret others.  Please give us  48 hours in order for your provider to thoroughly review all the results before contacting the office for clarification of your results.   _______________________________________________________  If your blood pressure at your visit was 140/90 or greater, please contact your primary care physician to follow up on this.  _______________________________________________________  If you are age 71 or older, your body mass index should be between 23-30. Your Body mass index is 24.57 kg/m. If this is out of the aforementioned range listed, please consider follow up with your Primary Care Provider.  If you are age 30 or younger, your body mass index should be between 19-25. Your Body mass index is 24.57 kg/m. If this is out of the aformentioned range listed, please consider follow  up with your Primary Care Provider.   ________________________________________________________  The [facility] providers would like to encourage you to use MYCHART to communicate with providers for non-urgent requests or questions.  Due to long hold times on the telephone, sending your provider a message by [facility] may be a faster and more efficient way to get a response.  Please allow 48 business hours for a response.  Please remember that this is for non-urgent requests.  _______________________________________________________  [facility] is using a team-based approach to care.  Your team is made up of your doctor and two to three APPS. Our APPS (Nurse Practitioners and Physician Assistants) work with your physician to ensure care continuity for you. They are fully qualified to address your health concerns and develop a treatment plan. They communicate directly with your gastroenterologist to care for you. Seeing the Advanced Practice Practitioners on your physician's team can help you by facilitating care more promptly, often allowing for earlier appointments, access to diagnostic testing, procedures, and other specialty referrals.   Thank you for trusting me with your gastrointestinal care. Deanna May, NP-C

## 2024-05-01 ENCOUNTER — Other Ambulatory Visit: Payer: Self-pay | Admitting: Internal Medicine

## 2024-05-01 NOTE — Progress Notes (Signed)
 Agree with the assessment and plan as outlined by [facility], FNP-C.  Carlitos Bottino, DO, [facility]

## 2024-05-19 ENCOUNTER — Encounter: Payer: Self-pay | Admitting: Gastroenterology

## 2024-05-20 ENCOUNTER — Ambulatory Visit
Admission: RE | Admit: 2024-05-20 | Discharge: 2024-05-20 | Disposition: A | Discharge status: Home / Self Care | Source: Ambulatory Visit | Attending: Acute Care | Admitting: Acute Care

## 2024-05-20 DIAGNOSIS — Z87891 Personal history of nicotine dependence: Secondary | ICD-10-CM | POA: Diagnosis not present

## 2024-05-20 DIAGNOSIS — R918 Other nonspecific abnormal finding of lung field: Secondary | ICD-10-CM | POA: Diagnosis not present

## 2024-05-20 DIAGNOSIS — Z122 Encounter for screening for malignant neoplasm of respiratory organs: Secondary | ICD-10-CM | POA: Insufficient documentation

## 2024-05-20 DIAGNOSIS — R911 Solitary pulmonary nodule: Secondary | ICD-10-CM | POA: Diagnosis not present

## 2024-05-20 DIAGNOSIS — J432 Centrilobular emphysema: Secondary | ICD-10-CM | POA: Diagnosis not present

## 2024-05-20 DIAGNOSIS — I700 Atherosclerosis of aorta: Secondary | ICD-10-CM | POA: Diagnosis not present

## 2024-05-27 ENCOUNTER — Encounter: Payer: Self-pay | Admitting: Gastroenterology

## 2024-05-27 ENCOUNTER — Ambulatory Visit (AMBULATORY_SURGERY_CENTER): Admitting: Gastroenterology

## 2024-05-27 VITALS — BP 176/87 | HR 72 | Temp 97.3°F | Resp 21 | Ht 64.0 in | Wt 143.2 lb

## 2024-05-27 DIAGNOSIS — R195 Other fecal abnormalities: Secondary | ICD-10-CM | POA: Diagnosis not present

## 2024-05-27 DIAGNOSIS — K5730 Diverticulosis of large intestine without perforation or abscess without bleeding: Secondary | ICD-10-CM

## 2024-05-27 DIAGNOSIS — Z860101 Personal history of adenomatous and serrated colon polyps: Secondary | ICD-10-CM | POA: Diagnosis not present

## 2024-05-27 DIAGNOSIS — K449 Diaphragmatic hernia without obstruction or gangrene: Secondary | ICD-10-CM | POA: Diagnosis not present

## 2024-05-27 DIAGNOSIS — D509 Iron deficiency anemia, unspecified: Secondary | ICD-10-CM

## 2024-05-27 DIAGNOSIS — K219 Gastro-esophageal reflux disease without esophagitis: Secondary | ICD-10-CM

## 2024-05-27 DIAGNOSIS — K319 Disease of stomach and duodenum, unspecified: Secondary | ICD-10-CM | POA: Diagnosis not present

## 2024-05-27 MED ORDER — SODIUM CHLORIDE 0.9 % IV SOLN: 500.0000 mL | Freq: Once | INTRAVENOUS | Status: DC

## 2024-05-27 NOTE — Progress Notes (Signed)
 1435 BP 178/90, Labetalol  given IV, MD update, vss

## 2024-05-27 NOTE — Patient Instructions (Signed)
 Discharge instructions given. Handouts on Hiatal Hernia and Diverticulosis. Resume oral iron. Repeat CBC and iron panel in 2 months. Office follow up scheduled in recovery. Resume previous medications. YOU HAD AN ENDOSCOPIC PROCEDURE TODAY AT THE [facility]:   Refer to the procedure report that was given to you for any specific questions about what was found during the examination.  If the procedure report does not answer your questions, please call your gastroenterologist to clarify.  If you requested that your care partner not be given the details of your procedure findings, then the procedure report has been included in a sealed envelope for you to review at your convenience later.  YOU SHOULD EXPECT: Some feelings of bloating in the abdomen. Passage of more gas than usual.  Walking can help get rid of the air that was put into your GI tract during the procedure and reduce the bloating. If you had a lower endoscopy (such as a colonoscopy or flexible sigmoidoscopy) you may notice spotting of blood in your stool or on the toilet paper. If you underwent a bowel prep for your procedure, you may not have a normal bowel movement for a few days.  Please Note:  You might notice some irritation and congestion in your nose or some drainage.  This is from the oxygen used during your procedure.  There is no need for concern and it should clear up in a day or so.  SYMPTOMS TO REPORT IMMEDIATELY:  Following lower endoscopy (colonoscopy or flexible sigmoidoscopy):  Excessive amounts of blood in the stool  Significant tenderness or worsening of abdominal pains  Swelling of the abdomen that is new, acute  Fever of 100F or higher  Following upper endoscopy (EGD)  Vomiting of blood or coffee ground material  New chest pain or pain under the shoulder blades  Painful or persistently difficult swallowing  New shortness of breath  Fever of 100F or higher  Black, tarry-looking stools  For  urgent or emergent issues, a gastroenterologist can be reached at any hour by calling [phone]. Do not use MyChart messaging for urgent concerns.    DIET:  We do recommend a small meal at first, but then you may proceed to your regular diet.  Drink plenty of fluids but you should avoid alcoholic beverages for 24 hours.  ACTIVITY:  You should plan to take it easy for the rest of today and you should NOT DRIVE or use heavy machinery until tomorrow (because of the sedation medicines used during the test).    FOLLOW UP: Our staff will call the number listed on your records the next business day following your procedure.  We will call around 7:15- 8:00 am to check on you and address any questions or concerns that you may have regarding the information given to you following your procedure. If we do not reach you, we will leave a message.     If any biopsies were taken you will be contacted by phone or by letter within the next 1-3 weeks.  Please call us  at [phone] if you have not heard about the biopsies in 3 weeks.    SIGNATURES/CONFIDENTIALITY: You and/or your care partner have signed paperwork which will be entered into your electronic medical record.  These signatures attest to the fact that that the information above on your After Visit Summary has been reviewed and is understood.  Full responsibility of the confidentiality of this discharge information lies with you and/or your care-partner.

## 2024-05-27 NOTE — Progress Notes (Signed)
 GASTROENTEROLOGY PROCEDURE H&P NOTE   Primary Care Physician: Avelina Greig BRAVO, MD    Reason for Procedure:  Iron deficiency anemia, FOBT positive stool  Plan:    EGD, colonoscopy  Patient is appropriate for endoscopic procedure(s) in the ambulatory ([facility]) setting.  The nature of the procedure, as well as the risks, benefits, and alternatives were carefully and thoroughly reviewed with the patient. Ample time for discussion and questions allowed. The patient understood, was satisfied, and agreed to proceed.     HPI: Regina Grimes is a 73 y.o. female who presents for EGD and colonoscopy for evaluation of iron deficiency anemia.    FOBT positive during her IDA workup.  Otherwise no overt GI bleeding.  Does take ASA 325 mg on demand for headaches, and has used more frequently recently for headaches.  Otherwise no daily anticoagulation or antiplatelet therapy.  Started taking oral iron a few months ago with subsequent darkening of stool.  Does have a prior history of anemia and underwent EGD/colonoscopy in 2002 which was largely unrevealing and anemia resolved until recent recurrence.  Separately, does have longstanding history of GERD, currently treated with Prilosec 20 mg daily.  Patient was most recently seen in the Gastroenterology Clinic on 04/25/2024.No interval change in medical history since that appointment. Please refer to that note for full details regarding GI history and clinical presentation.   GI procedures: 09/06/21 colonoscopy with Dr. Aneita - One 4 mm polyp in the transverse colon, removed with a cold biopsy forceps. Resected and retrieved. - One 6 mm polyp in the descending colon, removed with a cold snare. Resected and retrieved. - Moderate diverticulosis in the left colon. - The examination was otherwise normal on direct and retroflexion views. Path: Surgical [P], colon, descending and transverse, polyp (2) TUBULAR ADENOMA AND A HYPERPLASTIC POLYP. NEGATIVE FOR  HIGH-GRADE DYSPLASIA  11/2010 colonoscopy with Dr. Aneita 5 mm sessile polyp in the rectum Normal colon  04/2001 colonoscopy with Dr. [facility] Entire colon tortuous Cecum submucosal mass in cecum about 5 cm.  External hemorrhoids grade 2  04/2001 EGD for anemia with Dr. [facility] Distal esophagus stricture Schatzki ring with mild esophagitis that was dilated.  No heme present on extraction.  Mucosal abnormality fundus to atrium.  Granular mucosa.  Minimal gastritis.  Past Medical History:  Diagnosis Date   Allergy    seasonal- claritin D PRN   Anemia    past hx many years  ago   Aortic atherosclerosis (HCC)    Arthritis    arm   Cancer (HCC) 10/2011   Left Lung cancer with Partial Lobectomy.   Coughing up blood    10/2011, admitted to [place]    GERD (gastroesophageal reflux disease)    Hiatal hernia    Hyperlipidemia    Hypertension    treated for while in hosp. 10/2011, given med. , unsure of name of med., not told to continue    Osteopenia    Personal history of chemotherapy    completed 03-2012   Psoriasis    has used methotrexate  since she was 73y.o., when psoriasis flares. Currently methotrexate  on hold since 11/05/2011    Recurrent upper respiratory infection (URI)    lung infection, treated /w antibiotic - 10/2011   Thyroid  disease     Past Surgical History:  Procedure Laterality Date   abnormal PAP     low grade intrepithelial lesion. Colposcipy okay   APPENDECTOMY     1982- along /w tubal ligation  Benign tumors of left foot  1992/1994   BREAST BIOPSY Right 2013   benign   COLONOSCOPY  04/2001   ESOPHAGOGASTRODUODENOSCOPY  04/2001   FOOT NEUROMA SURGERY     L foot- 1990's   LUNG REMOVAL, PARTIAL  2013   POLYPECTOMY     [facility] 2012   THYROIDECTOMY N/A 09/20/2016   Procedure: THYROIDECTOMY;  Surgeon: Deward Dolly, MD;  Location: [facility] ORS;  Service: ENT;  Laterality: N/A;   TUBAL LIGATION  1982   UPPER GASTROINTESTINAL ENDOSCOPY     VD     x 2   VIDEO  BRONCHOSCOPY  11/16/2011   Procedure: VIDEO BRONCHOSCOPY WITH FLUORO;  Surgeon: Harden GAILS. Jude, MD;  Location: [facility];  Service: Cardiopulmonary;  Laterality: Bilateral;   VIDEO BRONCHOSCOPY  12/21/2011   Procedure: VIDEO BRONCHOSCOPY;  Surgeon: Dorise MARLA Fellers, MD;  Location: [facility];  Service: Thoracic;  Laterality: N/A;    Prior to Admission medications   Medication Sig Start Date End Date Taking? Authorizing Provider  albuterol  (VENTOLIN  HFA) 108 (90 Base) MCG/ACT inhaler INHALE 2 PUFFS INTO THE LUNGS EVERY 6 HOURS AS NEEDED FOR WHEEZING OR SHORTNESS OF BREATH 09/03/23   Jimmy Charlie FERNS, MD  amoxicillin  (AMOXIL ) 500 MG capsule Take 2 capsules (1,000 mg total) by mouth 2 (two) times daily. 12/04/23   Bedsole, Amy E, MD  azithromycin  (ZITHROMAX  Z-PAK) 250 MG tablet Take 2 tablets (500 mg) on  Day 1,  followed by 1 tablet (250 mg) once daily on Days 2 through 5. 03/31/24   Jimmy Charlie FERNS, MD  fluocinonide  ointment (LIDEX ) 0.05 % APPLY TOPICALLY TO THE AFFECTED AREA TWICE DAILY AS NEEDED FOR PSORIASIS 01/16/24   Jimmy Charlie FERNS, MD  folic acid  (FOLVITE ) 1 MG tablet Take 1 mg by mouth as directed. 1 TABLET ONCE A WEEK AS NEEDED WITH METHOTREXATE .    [provider]  levothyroxine  (SYNTHROID ) 75 MCG tablet Take 1 tablet (75 mcg total) by mouth daily. 02/21/24   Jimmy Charlie FERNS, MD  methotrexate  (RHEUMATREX) 2.5 MG tablet TAKE 6 TABLETS BY MOUTH 1 TIME A WEEK 02/11/24   Letvak, Richard I, MD  omeprazole (PRILOSEC) 20 MG capsule Take by mouth.    [provider]  rosuvastatin  (CRESTOR ) 20 MG tablet TAKE 1 TABLET(20 MG) BY MOUTH 1 TIME A WEEK Patient not taking: Reported on 04/25/2024 12/29/22   Letvak, Richard I, MD  valsartan  (DIOVAN ) 80 MG tablet TAKE 1 TABLET BY MOUTH EVERY DAY 12/17/23   Jimmy Charlie FERNS, MD    Current Outpatient Medications  Medication Sig Dispense Refill   albuterol  (VENTOLIN  HFA) 108 (90 Base) MCG/ACT inhaler INHALE 2 PUFFS INTO THE LUNGS EVERY 6 HOURS AS  NEEDED FOR WHEEZING OR SHORTNESS OF BREATH 8.5 g 0   amoxicillin  (AMOXIL ) 500 MG capsule Take 2 capsules (1,000 mg total) by mouth 2 (two) times daily. 40 capsule 0   azithromycin  (ZITHROMAX  Z-PAK) 250 MG tablet Take 2 tablets (500 mg) on  Day 1,  followed by 1 tablet (250 mg) once daily on Days 2 through 5. 6 each 0   fluocinonide  ointment (LIDEX ) 0.05 % APPLY TOPICALLY TO THE AFFECTED AREA TWICE DAILY AS NEEDED FOR PSORIASIS 30 g 5   folic acid  (FOLVITE ) 1 MG tablet Take 1 mg by mouth as directed. 1 TABLET ONCE A WEEK AS NEEDED WITH METHOTREXATE .     levothyroxine  (SYNTHROID ) 75 MCG tablet Take 1 tablet (75 mcg total) by mouth daily. 90 tablet 3   methotrexate  (  RHEUMATREX) 2.5 MG tablet TAKE 6 TABLETS BY MOUTH 1 TIME A WEEK 72 tablet 3   omeprazole (PRILOSEC) 20 MG capsule Take by mouth.     rosuvastatin  (CRESTOR ) 20 MG tablet TAKE 1 TABLET(20 MG) BY MOUTH 1 TIME A WEEK (Patient not taking: Reported on 04/25/2024) 13 tablet 3   valsartan  (DIOVAN ) 80 MG tablet TAKE 1 TABLET BY MOUTH EVERY DAY 90 tablet 0   No current facility-administered medications for this visit.    Allergies as of 05/27/2024 - Review Complete 04/25/2024  Allergen Reaction Noted   Aspirin  12/06/2010   Codeine  11/15/2011   Pravastatin sodium  04/23/2008    Family History  Problem Relation Age of Onset   Coronary artery disease Mother    Heart disease Mother        atrial fibrillation   Lung cancer Father    Lung cancer Brother    Breast cancer Paternal Aunt 10   Cirrhosis Maternal Grandmother    Anesthesia problems Neg Hx    Colon cancer Neg Hx    Colon polyps Neg Hx    Esophageal cancer Neg Hx    Stomach cancer Neg Hx    Rectal cancer Neg Hx     Social History   Socioeconomic History   Marital status: Married    Spouse name: Not on file   Number of children: 2   Years of education: Not on file   Highest education level: Not on file  Occupational History   Occupation: Lab Scientist, forensic     Comment: Retired  Tobacco Use   Smoking status: Former    Current packs/day: 0.00    Average packs/day: 1 pack/day for 44.0 years (44.0 ttl pk-yrs)    Types: Cigarettes    Start date: 06/25/1966    Quit date: 06/25/2010    Years since quitting: 13.9    Passive exposure: Past   Smokeless tobacco: Never  Vaping Use   Vaping status: Never Used  Substance and Sexual Activity   Alcohol use: Yes    Comment: OCC   Drug use: No   Sexual activity: Not on file  Other Topics Concern   Not on file  Social History Narrative   1 son--died   1 daughter       Has living will   Husband and then daughter would be HC power of attorney   Would accept resuscitation   No tube feeds if cognitively unaware   Social Drivers of Health   Financial Resource Strain: Low Risk  (02/11/2024)   Overall Financial Resource Strain (CARDIA)    Difficulty of Paying Living Expenses: Not hard at all  Food Insecurity: No Food Insecurity (02/11/2024)   Hunger Vital Sign    Worried About Running Out of Food in the Last Year: Never true    Ran Out of Food in the Last Year: Never true  Transportation Needs: No Transportation Needs (02/11/2024)   PRAPARE - Administrator, Civil Service (Medical): No    Lack of Transportation (Non-Medical): No  Physical Activity: Insufficiently Active (02/11/2024)   Exercise Vital Sign    Days of Exercise per Week: 1 day    Minutes of Exercise per Session: 30 min  Stress: No Stress Concern Present (02/11/2024)   [employer] of Occupational Health - Occupational Stress Questionnaire    Feeling of Stress : Not at all  Social Connections: Moderately Isolated (02/11/2024)   Social Connection and Isolation Panel    Frequency  of Communication with Friends and Family: More than three times a week    Frequency of Social Gatherings with Friends and Family: More than three times a week    Attends Religious Services: Never    Database administrator or Organizations: No     Attends Banker Meetings: Never    Marital Status: Married  Catering manager Violence: Not At Risk (02/11/2024)   Humiliation, Afraid, Rape, and Kick questionnaire    Fear of Current or Ex-Partner: No    Emotionally Abused: No    Physically Abused: No    Sexually Abused: No    Physical Exam: Vital signs in last 24 hours: @There  were no vitals taken for this visit. GEN: NAD EYE: Sclerae anicteric ENT: MMM CV: Non-tachycardic Pulm: CTA b/l GI: Soft, NT/ND NEURO:  Alert & Oriented x 3   Sandor Flatter, DO [facility]   05/27/2024 1:43 PM

## 2024-05-27 NOTE — Progress Notes (Signed)
1410 Robinul 0.1 mg IV given due large amount of secretions upon assessment.  MD made aware, vss  

## 2024-05-27 NOTE — Progress Notes (Signed)
 Report given to PACU, vss

## 2024-05-27 NOTE — Progress Notes (Signed)
 Called to room to assist during endoscopic procedure.  Patient ID and intended procedure confirmed with present staff. Received instructions for my participation in the procedure from the performing physician.

## 2024-05-27 NOTE — Op Note (Signed)
 [facility] Patient Name: Regina Grimes Procedure Date: 05/27/2024 2:05 PM MRN: [id number] Endoscopist: Sandor Flatter , MD, [id number] Age: 73 Referring MD:  Date of Birth: 09-Aug-1951 Gender: Female Account #: [id number] Procedure:                Colonoscopy Indications:              Iron deficiency anemia Medicines:                Monitored Anesthesia Care Procedure:                Pre-Anesthesia Assessment:                           - Prior to the procedure, a History and Physical                            was performed, and patient medications and                            allergies were reviewed. The patient's tolerance of                            previous anesthesia was also reviewed. The risks                            and benefits of the procedure and the sedation                            options and risks were discussed with the patient.                            All questions were answered, and informed consent                            was obtained. Prior Anticoagulants: The patient has                            taken no anticoagulant or antiplatelet agents. ASA                            Grade Assessment: II - A patient with mild systemic                            disease. After reviewing the risks and benefits,                            the patient was deemed in satisfactory condition to                            undergo the procedure.                           After obtaining informed consent, the colonoscope  was passed under direct vision. Throughout the                            procedure, the patient's blood pressure, pulse, and                            oxygen saturations were monitored continuously. The                            [id number]-HQ190L Colonoscope [id number] was introduced                            through the anus and advanced to the the terminal                            ileum. The colonoscopy was performed  without                            difficulty. The patient tolerated the procedure                            well. The quality of the bowel preparation was                            good. The terminal ileum, ileocecal valve,                            appendiceal orifice, and rectum were photographed. Scope In: 2:27:21 PM Scope Out: 2:43:04 PM Scope Withdrawal Time: 0 hours 9 minutes 4 seconds  Total Procedure Duration: 0 hours 15 minutes 43 seconds  Findings:                 The perianal and digital rectal examinations were                            normal.                           Multiple large-mouthed and small-mouthed                            diverticula were found in the sigmoid colon.                           The exam was otherwise normal throughout the                            remainder of the colon.                           The retroflexed view of the distal rectum and anal                            verge was normal and showed no anal or rectal  abnormalities.                           The terminal ileum appeared normal. Complications:            No immediate complications. Estimated Blood Loss:     Estimated blood loss: none. Impression:               - Diverticulosis in the sigmoid colon.                           - The distal rectum and anal verge are normal on                            retroflexion view.                           - The examined portion of the ileum was normal.                           - No specimens collected. Recommendation:           - Patient has a contact number available for                            emergencies. The signs and symptoms of potential                            delayed complications were discussed with the                            patient. Return to normal activities tomorrow.                            Written discharge instructions were provided to the                            patient.                            - Resume previous diet.                           - Continue present medications.                           - Repeat colonoscopy is not recommended due to                            current age (92 years or older) for screening                            purposes.                           - Resume oral iron.                           -  Repeat CBC and iron panel in 2 months.                           - Return to GI office at routine appointment to be                            scheduled. If continued iron deficiency anemia, can                            consider further small bowel interrogation with                            video capsule endoscopy. Sandor Flatter, MD 05/27/2024 2:58:23 PM

## 2024-05-27 NOTE — Op Note (Signed)
 [facility] Patient Name: Regina Grimes Procedure Date: 05/27/2024 2:06 PM MRN: [id number] Endoscopist: Sandor Flatter , MD, [id number] Age: 73 Referring MD:  Date of Birth: 10/11/1950 Gender: Female Account #: [id number] Procedure:                Upper GI endoscopy Indications:              Iron deficiency anemia                           Separately, she has a history of GERD that is well                            controlled with omeprazole daily. Medicines:                Monitored Anesthesia Care Procedure:                Pre-Anesthesia Assessment:                           - Prior to the procedure, a History and Physical                            was performed, and patient medications and                            allergies were reviewed. The patient's tolerance of                            previous anesthesia was also reviewed. The risks                            and benefits of the procedure and the sedation                            options and risks were discussed with the patient.                            All questions were answered, and informed consent                            was obtained. Prior Anticoagulants: The patient has                            taken no anticoagulant or antiplatelet agents. ASA                            Grade Assessment: II - A patient with mild systemic                            disease. After reviewing the risks and benefits,                            the patient was deemed in satisfactory condition to  undergo the procedure.                           After obtaining informed consent, the endoscope was                            passed under direct vision. Throughout the                            procedure, the patient's blood pressure, pulse, and                            oxygen saturations were monitored continuously. The                            Olympus scope [id number] was introduced through  the                            mouth, and advanced to the second part of duodenum.                            The upper GI endoscopy was accomplished without                            difficulty. The patient tolerated the procedure                            well. Scope In: Scope Out: Findings:                 A 3 cm hiatal hernia was present.                           The Z-line was regular and was found 37 cm from the                            incisors.                           The upper third of the esophagus and middle third                            of the esophagus were normal.                           The entire examined stomach was normal. Biopsies                            were taken with a cold forceps for Helicobacter                            pylori testing. Estimated blood loss was minimal.                           The examined duodenum was normal. Biopsies were  taken with a cold forceps for histology. Estimated                            blood loss was minimal. Complications:            No immediate complications. Estimated Blood Loss:     Estimated blood loss was minimal. Impression:               - 3 cm hiatal hernia.                           - Z-line regular, 37 cm from the incisors.                           - Normal upper third of esophagus and middle third                            of esophagus.                           - Normal stomach. Biopsied.                           - Normal examined duodenum. Biopsied. Recommendation:           - Patient has a contact number available for                            emergencies. The signs and symptoms of potential                            delayed complications were discussed with the                            patient. Return to normal activities tomorrow.                            Written discharge instructions were provided to the                            patient.                            - Resume previous diet.                           - Continue present medications.                           - Await pathology results. Sandor Flatter, MD 05/27/2024 2:54:16 PM

## 2024-05-28 ENCOUNTER — Telehealth: Payer: Self-pay

## 2024-05-28 NOTE — Telephone Encounter (Signed)
  Follow up Call-     05/27/2024    1:46 PM 09/06/2021    9:49 AM  Call back number  Post procedure Call Back phone  # [phone]  Permission to leave phone message Yes Yes     Patient questions:  Do you have a fever, pain , or abdominal swelling? No. Pain Score  0 *  Have you tolerated food without any problems? Yes.    Have you been able to return to your normal activities? Yes.    Do you have any questions about your discharge instructions: Diet   No. Medications  No. Follow up visit  No.  Do you have questions or concerns about your Care? No.  Actions: * If pain score is 4 or above: No action needed, pain <4.

## 2024-05-30 ENCOUNTER — Ambulatory Visit: Payer: Self-pay | Admitting: Gastroenterology

## 2024-05-30 LAB — SURGICAL PATHOLOGY

## 2024-06-02 ENCOUNTER — Other Ambulatory Visit: Payer: Self-pay

## 2024-06-02 DIAGNOSIS — Z87891 Personal history of nicotine dependence: Secondary | ICD-10-CM

## 2024-06-02 DIAGNOSIS — Z122 Encounter for screening for malignant neoplasm of respiratory organs: Secondary | ICD-10-CM

## 2024-06-09 ENCOUNTER — Telehealth: Payer: Self-pay

## 2024-06-09 NOTE — Telephone Encounter (Signed)
 Copied from CRM #[id number]. Topic: Clinical - Medication Question >> Jun 09, 2024  8:34 AM Charlet HERO wrote: Reason for CRM: Patient is calling to have script sent in to the [facility] for covid vaccine please contact patient to let know when it has been sent to pharmacy.    Ok to send as pended?

## 2024-06-10 MED ORDER — COVID-19 MRNA VAC-TRIS(PFIZER) 30 MCG/0.3ML IM SUSY: 0.3000 mL | PREFILLED_SYRINGE | Freq: Once | INTRAMUSCULAR | 0 refills | Status: DC

## 2024-06-10 MED ORDER — COVID-19 MRNA VAC-TRIS(PFIZER) 30 MCG/0.3ML IM SUSY: 0.3000 mL | PREFILLED_SYRINGE | Freq: Once | INTRAMUSCULAR | 0 refills | Status: AC

## 2024-06-10 NOTE — Telephone Encounter (Signed)
 let patient know this has been sent.  Ignore printed

## 2024-06-10 NOTE — Telephone Encounter (Signed)
 Copied from CRM #[id number]. Topic: Clinical - Medication Question >> Jun 09, 2024  8:34 AM Charlet HERO wrote: Reason for CRM: Patient is calling to have script sent in to the [facility] for covid vaccine please contact patient to let know when it has been sent to pharmacy. >> Jun 10, 2024 12:11 PM Robinson H wrote: Patient is following up on message for covid vaccine prescription to be sent to the pharmacy, patient states she has an appointment for tomorrow at 12pm to have the vaccine done. Please reach out with an update, thanks.  [place] [phone]

## 2024-06-10 NOTE — Telephone Encounter (Signed)
 Regina Grimes notified by telephone that Rx for Covid vaccine has been sent to her pharmacy as requested.

## 2024-06-11 ENCOUNTER — Other Ambulatory Visit: Payer: Self-pay

## 2024-06-11 MED ORDER — VALSARTAN 80 MG PO TABS: 80.0000 mg | ORAL_TABLET | Freq: Every day | ORAL | 2 refills | Status: AC

## 2024-06-11 NOTE — Telephone Encounter (Signed)
 Rx sent electronically.

## 2024-07-28 ENCOUNTER — Other Ambulatory Visit: Payer: Self-pay

## 2024-07-28 DIAGNOSIS — D509 Iron deficiency anemia, unspecified: Secondary | ICD-10-CM

## 2024-07-28 DIAGNOSIS — R195 Other fecal abnormalities: Secondary | ICD-10-CM

## 2024-08-19 ENCOUNTER — Ambulatory Visit: Admitting: Gastroenterology

## 2024-09-22 ENCOUNTER — Other Ambulatory Visit: Payer: Self-pay | Admitting: Family Medicine

## 2024-09-22 MED ORDER — ALBUTEROL SULFATE HFA 108 (90 BASE) MCG/ACT IN AERS: 2.0000 | INHALATION_SPRAY | Freq: Four times a day (QID) | RESPIRATORY_TRACT | 0 refills | Status: AC | PRN

## 2025-02-02 ENCOUNTER — Ambulatory Visit

## 2025-02-12 ENCOUNTER — Ambulatory Visit

## 2025-02-26 ENCOUNTER — Encounter: Admitting: Family Medicine
